# Patient Record
Sex: Male | Born: 1935 | Race: White | Hispanic: No | Marital: Married | Smoking: Former smoker
Health system: Southern US, Community
[De-identification: ages and names within clinical notes are randomized; demographics above are authoritative.]

## PROBLEM LIST (undated history)

## (undated) DIAGNOSIS — I214 Non-ST elevation (NSTEMI) myocardial infarction: Secondary | ICD-10-CM

## (undated) DIAGNOSIS — F0390 Unspecified dementia without behavioral disturbance: Secondary | ICD-10-CM

## (undated) DIAGNOSIS — C44310 Basal cell carcinoma of skin of unspecified parts of face: Secondary | ICD-10-CM

## (undated) DIAGNOSIS — R42 Dizziness and giddiness: Secondary | ICD-10-CM

## (undated) DIAGNOSIS — R627 Adult failure to thrive: Secondary | ICD-10-CM

## (undated) HISTORY — PX: HERNIA REPAIR: SHX51

## (undated) HISTORY — DX: Basal cell carcinoma of skin of unspecified parts of face: C44.310

---

## 1998-10-14 ENCOUNTER — Emergency Department (HOSPITAL_COMMUNITY): Admission: EM | Admit: 1998-10-14 | Discharge: 1998-10-14 | Payer: Self-pay

## 1998-10-15 ENCOUNTER — Ambulatory Visit (HOSPITAL_COMMUNITY): Admission: RE | Admit: 1998-10-15 | Discharge: 1998-10-15 | Payer: Self-pay

## 2005-12-29 ENCOUNTER — Inpatient Hospital Stay (HOSPITAL_COMMUNITY): Admission: EM | Admit: 2005-12-29 | Discharge: 2005-12-30 | Payer: Self-pay | Admitting: *Deleted

## 2012-04-14 DIAGNOSIS — Z23 Encounter for immunization: Secondary | ICD-10-CM | POA: Diagnosis not present

## 2012-10-11 DIAGNOSIS — R42 Dizziness and giddiness: Secondary | ICD-10-CM | POA: Diagnosis not present

## 2012-10-11 DIAGNOSIS — R404 Transient alteration of awareness: Secondary | ICD-10-CM | POA: Diagnosis not present

## 2013-02-11 ENCOUNTER — Emergency Department (HOSPITAL_COMMUNITY): Payer: Medicare Other

## 2013-02-11 ENCOUNTER — Emergency Department (HOSPITAL_COMMUNITY)
Admission: EM | Admit: 2013-02-11 | Discharge: 2013-02-11 | Disposition: A | Discharge status: Home / Self Care | Payer: Medicare Other | Attending: Emergency Medicine | Admitting: Emergency Medicine

## 2013-02-11 ENCOUNTER — Encounter (HOSPITAL_COMMUNITY): Payer: Self-pay | Admitting: *Deleted

## 2013-02-11 DIAGNOSIS — R5381 Other malaise: Secondary | ICD-10-CM | POA: Diagnosis not present

## 2013-02-11 DIAGNOSIS — Z79899 Other long term (current) drug therapy: Secondary | ICD-10-CM | POA: Insufficient documentation

## 2013-02-11 DIAGNOSIS — Z7982 Long term (current) use of aspirin: Secondary | ICD-10-CM | POA: Insufficient documentation

## 2013-02-11 DIAGNOSIS — R5383 Other fatigue: Secondary | ICD-10-CM

## 2013-02-11 DIAGNOSIS — R531 Weakness: Secondary | ICD-10-CM

## 2013-02-11 DIAGNOSIS — R42 Dizziness and giddiness: Secondary | ICD-10-CM

## 2013-02-11 HISTORY — DX: Dizziness and giddiness: R42

## 2013-02-11 LAB — COMPREHENSIVE METABOLIC PANEL
BUN: 14 mg/dL (ref 6–23)
Calcium: 9.2 mg/dL (ref 8.4–10.5)
Creatinine, Ser: 1.17 mg/dL (ref 0.50–1.35)
GFR calc Af Amer: 64 mL/min — ABNORMAL LOW (ref >90)
Glucose, Bld: 116 mg/dL — ABNORMAL HIGH (ref 70–99)
Total Protein: 6.9 g/dL (ref 6.0–8.3)

## 2013-02-11 LAB — CBC WITH DIFFERENTIAL/PLATELET
Eosinophils Absolute: 0 10*3/uL (ref 0.0–0.7)
Eosinophils Relative: 0 % (ref 0–5)
Hemoglobin: 13 g/dL (ref 13.0–17.0)
Lymphs Abs: 2.3 10*3/uL (ref 0.7–4.0)
MCH: 32 pg (ref 26.0–34.0)
MCV: 91.6 fL (ref 78.0–100.0)
Monocytes Relative: 8 % (ref 3–12)
RBC: 4.06 MIL/uL — ABNORMAL LOW (ref 4.22–5.81)

## 2013-02-11 LAB — URINALYSIS, ROUTINE W REFLEX MICROSCOPIC
Ketones, ur: 15 mg/dL — AB
Leukocytes, UA: NEGATIVE
Nitrite: NEGATIVE
Specific Gravity, Urine: 1.016 (ref 1.005–1.030)
pH: 6.5 (ref 5.0–8.0)

## 2013-02-11 MED ORDER — MECLIZINE HCL 25 MG PO TABS
25.0000 mg | ORAL_TABLET | Freq: Once | ORAL | Status: AC
  Administered 2013-02-11: 25 mg via ORAL
  Filled 2013-02-11: qty 1

## 2013-02-11 NOTE — ED Notes (Signed)
Pt and family reports pt having syncopal episode in April and has felt bad since then. Reports generalized fatigue and weakness, denies confusion. resp e/u. Skin w/d. Mae x4. A&ox4.

## 2013-02-11 NOTE — ED Provider Notes (Signed)
History     CSN: [id number]  Arrival date & time 02/11/13  1102   First MD Initiated Contact with Patient 02/11/13 1135      Chief Complaint  Patient presents with  . Fatigue    (Consider location/radiation/quality/duration/timing/severity/associated sxs/prior treatment) HPI Comments: Patient brought in today by his wife due to generalized weakness and fatigue.  His wife reports that the patient has been fatigued since April, however, she reports that his fatigue seemed worse over the past couple of days and he has been eating less.  Patient reports that he is feeling "fine."  He denies any complaints at this time aside from intermittent vertigo.  He states that he has had vertigo for several years.  No change in his symptoms at this time.  Vertigo worsens when he lies down too fast or sits up too fast.  He reports that he takes Meclizine for his symptoms, which does help.  He denies any dizziness at this time.Marland Kitchen  He denies chest pain, SOB, nausea, vomiting, diarrhea, urinary symptoms, cough, fever, focal weakness, numbness, or tingling.  He currently lives at home with his wife.  He does have a PCP.    The history is provided by the patient and the spouse.    Past Medical History  Diagnosis Date  . Vertigo     History reviewed. No pertinent past surgical history.  History reviewed. No pertinent family history.  History  Substance Use Topics  . Smoking status: Not on file  . Smokeless tobacco: Not on file  . Alcohol Use: No      Review of Systems  All other systems reviewed and are negative.    Allergies  Review of patient's allergies indicates no known allergies.  Home Medications   Current Outpatient Rx  Name  Route  Sig  Dispense  Refill  . acetaminophen (TYLENOL) 500 MG tablet   Oral   Take 1,000 mg by mouth every 6 (six) hours as needed for pain.         . Ascorbic Acid (VITAMIN C PO)   Oral   Take 1 tablet by mouth daily.         Marland Kitchen aspirin 81 MG  tablet   Oral   Take 81 mg by mouth daily.         . Cholecalciferol (VITAMIN D PO)   Oral   Take 1 tablet by mouth daily.         . Multiple Vitamins-Minerals (MULTIVITAMIN WITH MINERALS) tablet   Oral   Take 1 tablet by mouth daily.         . ranitidine (ZANTAC) 150 MG tablet   Oral   Take 150 mg by mouth daily.         Marland Kitchen VITAMIN E PO   Oral   Take 1 tablet by mouth daily.           BP 181/102  Pulse 102  Temp(Src) 98.3 F (36.8 C) (Oral)  Resp 22  SpO2 98%  Physical Exam  Nursing note and vitals reviewed. Constitutional: He is oriented to person, place, and time. He appears well-developed and well-nourished.  Non-toxic appearance. He does not have a sickly appearance. He does not appear ill. No distress.  HENT:  Head: Normocephalic and atraumatic.  Mouth/Throat: Oropharynx is clear and moist.  Eyes: EOM are normal. Pupils are equal, round, and reactive to light.  Neck: Normal range of motion. Neck supple.  Cardiovascular: Normal rate, regular rhythm and normal  heart sounds.   Pulmonary/Chest: Effort normal and breath sounds normal. No respiratory distress. He has no wheezes. He has no rales.  Abdominal: Soft. Bowel sounds are normal. He exhibits no distension and no mass. There is no tenderness. There is no rebound and no guarding.  Musculoskeletal: Normal range of motion.  Neurological: He is alert and oriented to person, place, and time. He has normal strength. No cranial nerve deficit or sensory deficit. Coordination and gait normal.  Skin: Skin is warm and dry. He is not diaphoretic.  Psychiatric: He has a normal mood and affect.    ED Course  Procedures (including critical care time)  Labs Reviewed  CBC WITH DIFFERENTIAL  COMPREHENSIVE METABOLIC PANEL  URINALYSIS, ROUTINE W REFLEX MICROSCOPIC   Dg Chest 2 View  02/11/2013   *RADIOLOGY REPORT*  Clinical Data: Weakness  CHEST - 2 VIEW  Comparison: Radiograph 12/29/2005  Findings: Normal cardiac  silhouette ectatic aorta.  No effusion, infiltrate, or pneumothorax. Degenerative osteophytosis of the thoracic spine. No acute cardiopulmonary process.  IMPRESSION: No acute cardiopulmonary process.   Original Report Authenticated By: Genevive Bi, M.D.     No diagnosis found.  Patient discussed with Dr. Rubin Payor.  MDM  Patient brought in today by his wife due to generalized weakness and fatigue.  Patient reports that he is feeling fine and denies any symptoms.  Patient is not ill appearing.  Patient with a normal neurological exam.  CXR negative.  UA negative.  Labs unremarkable.  Therefore, feel that the patient is stable for discharge.  Patient instructed to follow up with PCP.  Return precautions given.        Pascal Lux [place], PA-C 02/12/13 1502  Pascal Lux [place], PA-C 02/12/13 1502

## 2013-02-11 NOTE — ED Notes (Signed)
Patient transported to X-ray 

## 2013-02-12 NOTE — ED Provider Notes (Signed)
Medical screening examination/treatment/procedure(s) were performed by non-physician practitioner and as supervising physician I was immediately available for consultation/collaboration.  Lesbia Ottaway R. Almira Phetteplace, MD 02/12/13 2106 

## 2013-02-27 DIAGNOSIS — R911 Solitary pulmonary nodule: Secondary | ICD-10-CM | POA: Diagnosis not present

## 2013-02-27 DIAGNOSIS — K5730 Diverticulosis of large intestine without perforation or abscess without bleeding: Secondary | ICD-10-CM | POA: Diagnosis not present

## 2013-02-27 DIAGNOSIS — Z125 Encounter for screening for malignant neoplasm of prostate: Secondary | ICD-10-CM | POA: Diagnosis not present

## 2013-02-27 DIAGNOSIS — I10 Essential (primary) hypertension: Secondary | ICD-10-CM | POA: Diagnosis not present

## 2013-02-27 DIAGNOSIS — R42 Dizziness and giddiness: Secondary | ICD-10-CM | POA: Diagnosis not present

## 2013-02-27 DIAGNOSIS — R413 Other amnesia: Secondary | ICD-10-CM | POA: Diagnosis not present

## 2013-02-28 ENCOUNTER — Other Ambulatory Visit: Payer: Self-pay | Admitting: Internal Medicine

## 2013-02-28 DIAGNOSIS — R42 Dizziness and giddiness: Secondary | ICD-10-CM

## 2013-03-06 ENCOUNTER — Ambulatory Visit
Admission: RE | Admit: 2013-03-06 | Discharge: 2013-03-06 | Disposition: A | Discharge status: Home / Self Care | Payer: Medicare Other | Source: Ambulatory Visit | Attending: Internal Medicine | Admitting: Internal Medicine

## 2013-03-06 DIAGNOSIS — R42 Dizziness and giddiness: Secondary | ICD-10-CM | POA: Diagnosis not present

## 2013-03-06 DIAGNOSIS — I10 Essential (primary) hypertension: Secondary | ICD-10-CM | POA: Diagnosis not present

## 2013-03-06 DIAGNOSIS — R413 Other amnesia: Secondary | ICD-10-CM | POA: Diagnosis not present

## 2013-03-06 DIAGNOSIS — R630 Anorexia: Secondary | ICD-10-CM | POA: Diagnosis not present

## 2013-03-06 DIAGNOSIS — R7309 Other abnormal glucose: Secondary | ICD-10-CM | POA: Diagnosis not present

## 2013-03-06 MED ORDER — GADOBENATE DIMEGLUMINE 529 MG/ML IV SOLN
6.0000 mL | Freq: Once | INTRAVENOUS | Status: AC | PRN
  Administered 2013-03-06: 6 mL via INTRAVENOUS

## 2013-03-21 DIAGNOSIS — R413 Other amnesia: Secondary | ICD-10-CM | POA: Diagnosis not present

## 2013-03-21 DIAGNOSIS — I10 Essential (primary) hypertension: Secondary | ICD-10-CM | POA: Diagnosis not present

## 2013-06-07 DIAGNOSIS — Z23 Encounter for immunization: Secondary | ICD-10-CM | POA: Diagnosis not present

## 2014-06-13 DIAGNOSIS — Z23 Encounter for immunization: Secondary | ICD-10-CM | POA: Diagnosis not present

## 2014-09-30 DIAGNOSIS — I10 Essential (primary) hypertension: Secondary | ICD-10-CM | POA: Diagnosis not present

## 2015-03-24 DIAGNOSIS — I10 Essential (primary) hypertension: Secondary | ICD-10-CM | POA: Diagnosis not present

## 2015-04-10 DIAGNOSIS — D649 Anemia, unspecified: Secondary | ICD-10-CM | POA: Diagnosis not present

## 2015-04-10 DIAGNOSIS — Z125 Encounter for screening for malignant neoplasm of prostate: Secondary | ICD-10-CM | POA: Diagnosis not present

## 2015-04-10 DIAGNOSIS — R739 Hyperglycemia, unspecified: Secondary | ICD-10-CM | POA: Diagnosis not present

## 2015-04-10 DIAGNOSIS — I10 Essential (primary) hypertension: Secondary | ICD-10-CM | POA: Diagnosis not present

## 2015-07-06 DIAGNOSIS — Z23 Encounter for immunization: Secondary | ICD-10-CM | POA: Diagnosis not present

## 2015-08-05 DIAGNOSIS — I10 Essential (primary) hypertension: Secondary | ICD-10-CM | POA: Diagnosis not present

## 2015-08-05 DIAGNOSIS — Z79899 Other long term (current) drug therapy: Secondary | ICD-10-CM | POA: Diagnosis not present

## 2015-08-05 DIAGNOSIS — D649 Anemia, unspecified: Secondary | ICD-10-CM | POA: Diagnosis not present

## 2015-08-05 DIAGNOSIS — Z125 Encounter for screening for malignant neoplasm of prostate: Secondary | ICD-10-CM | POA: Diagnosis not present

## 2015-08-11 DIAGNOSIS — I10 Essential (primary) hypertension: Secondary | ICD-10-CM | POA: Diagnosis not present

## 2015-08-11 DIAGNOSIS — N183 Chronic kidney disease, stage 3 (moderate): Secondary | ICD-10-CM | POA: Diagnosis not present

## 2015-08-11 DIAGNOSIS — R413 Other amnesia: Secondary | ICD-10-CM | POA: Diagnosis not present

## 2015-08-11 DIAGNOSIS — D649 Anemia, unspecified: Secondary | ICD-10-CM | POA: Diagnosis not present

## 2015-12-01 DIAGNOSIS — R739 Hyperglycemia, unspecified: Secondary | ICD-10-CM | POA: Diagnosis not present

## 2015-12-01 DIAGNOSIS — I10 Essential (primary) hypertension: Secondary | ICD-10-CM | POA: Diagnosis not present

## 2015-12-08 DIAGNOSIS — I10 Essential (primary) hypertension: Secondary | ICD-10-CM | POA: Diagnosis not present

## 2015-12-08 DIAGNOSIS — R739 Hyperglycemia, unspecified: Secondary | ICD-10-CM | POA: Diagnosis not present

## 2015-12-08 DIAGNOSIS — D649 Anemia, unspecified: Secondary | ICD-10-CM | POA: Diagnosis not present

## 2016-01-22 DIAGNOSIS — I214 Non-ST elevation (NSTEMI) myocardial infarction: Secondary | ICD-10-CM

## 2016-01-22 HISTORY — DX: Non-ST elevation (NSTEMI) myocardial infarction: I21.4

## 2016-02-16 ENCOUNTER — Encounter (HOSPITAL_COMMUNITY): Payer: Self-pay | Admitting: Emergency Medicine

## 2016-02-16 DIAGNOSIS — E785 Hyperlipidemia, unspecified: Secondary | ICD-10-CM | POA: Diagnosis not present

## 2016-02-16 DIAGNOSIS — F0390 Unspecified dementia without behavioral disturbance: Secondary | ICD-10-CM | POA: Diagnosis present

## 2016-02-16 DIAGNOSIS — K219 Gastro-esophageal reflux disease without esophagitis: Secondary | ICD-10-CM | POA: Diagnosis present

## 2016-02-16 DIAGNOSIS — N183 Chronic kidney disease, stage 3 (moderate): Secondary | ICD-10-CM | POA: Diagnosis present

## 2016-02-16 DIAGNOSIS — Z8673 Personal history of transient ischemic attack (TIA), and cerebral infarction without residual deficits: Secondary | ICD-10-CM

## 2016-02-16 DIAGNOSIS — Z7982 Long term (current) use of aspirin: Secondary | ICD-10-CM

## 2016-02-16 DIAGNOSIS — I214 Non-ST elevation (NSTEMI) myocardial infarction: Principal | ICD-10-CM | POA: Diagnosis present

## 2016-02-16 DIAGNOSIS — R1013 Epigastric pain: Secondary | ICD-10-CM | POA: Diagnosis not present

## 2016-02-16 DIAGNOSIS — I129 Hypertensive chronic kidney disease with stage 1 through stage 4 chronic kidney disease, or unspecified chronic kidney disease: Secondary | ICD-10-CM | POA: Diagnosis present

## 2016-02-16 DIAGNOSIS — R079 Chest pain, unspecified: Secondary | ICD-10-CM | POA: Diagnosis not present

## 2016-02-16 DIAGNOSIS — R7989 Other specified abnormal findings of blood chemistry: Secondary | ICD-10-CM | POA: Diagnosis not present

## 2016-02-16 DIAGNOSIS — M545 Low back pain: Secondary | ICD-10-CM | POA: Diagnosis not present

## 2016-02-16 LAB — CBC WITH DIFFERENTIAL/PLATELET
Basophils Absolute: 0 10*3/uL (ref 0.0–0.1)
Basophils Relative: 0 %
EOS PCT: 2 %
Eosinophils Absolute: 0.2 10*3/uL (ref 0.0–0.7)
HCT: 35.1 % — ABNORMAL LOW (ref 39.0–52.0)
Hemoglobin: 11.6 g/dL — ABNORMAL LOW (ref 13.0–17.0)
LYMPHS ABS: 2.1 10*3/uL (ref 0.7–4.0)
LYMPHS PCT: 24 %
MCH: 30.5 pg (ref 26.0–34.0)
MCHC: 33 g/dL (ref 30.0–36.0)
MCV: 92.4 fL (ref 78.0–100.0)
MONO ABS: 0.7 10*3/uL (ref 0.1–1.0)
MONOS PCT: 8 %
Neutro Abs: 5.9 10*3/uL (ref 1.7–7.7)
Neutrophils Relative %: 66 %
PLATELETS: 189 10*3/uL (ref 150–400)
RBC: 3.8 MIL/uL — AB (ref 4.22–5.81)
RDW: 12.9 % (ref 11.5–15.5)
WBC: 8.9 10*3/uL (ref 4.0–10.5)

## 2016-02-16 LAB — BASIC METABOLIC PANEL
Anion gap: 7 (ref 5–15)
BUN: 20 mg/dL (ref 6–20)
CALCIUM: 9.5 mg/dL (ref 8.9–10.3)
CO2: 22 mmol/L (ref 22–32)
CREATININE: 1.77 mg/dL — AB (ref 0.61–1.24)
Chloride: 108 mmol/L (ref 101–111)
GFR calc Af Amer: 38 mL/min — ABNORMAL LOW (ref >60)
GFR, EST NON AFRICAN AMERICAN: 33 mL/min — AB (ref >60)
GLUCOSE: 145 mg/dL — AB (ref 65–99)
Potassium: 4.2 mmol/L (ref 3.5–5.1)
SODIUM: 137 mmol/L (ref 135–145)

## 2016-02-16 LAB — URINALYSIS, ROUTINE W REFLEX MICROSCOPIC
BILIRUBIN URINE: NEGATIVE
GLUCOSE, UA: NEGATIVE mg/dL
HGB URINE DIPSTICK: NEGATIVE
Ketones, ur: NEGATIVE mg/dL
Leukocytes, UA: NEGATIVE
Nitrite: NEGATIVE
PROTEIN: NEGATIVE mg/dL
Specific Gravity, Urine: 1.025 (ref 1.005–1.030)
pH: 5 (ref 5.0–8.0)

## 2016-02-16 NOTE — ED Notes (Signed)
Pt. reports mid  back and left arm pain onset today , denies injury or fall .

## 2016-02-17 ENCOUNTER — Other Ambulatory Visit: Payer: Self-pay

## 2016-02-17 ENCOUNTER — Encounter (HOSPITAL_COMMUNITY): Payer: Self-pay | Admitting: General Practice

## 2016-02-17 ENCOUNTER — Encounter (HOSPITAL_COMMUNITY)
Admission: EM | Disposition: A | Discharge status: Home / Self Care | Payer: Self-pay | Source: Home / Self Care | Attending: Cardiology

## 2016-02-17 ENCOUNTER — Emergency Department (HOSPITAL_COMMUNITY): Payer: Medicare Other

## 2016-02-17 ENCOUNTER — Inpatient Hospital Stay (HOSPITAL_COMMUNITY)
Admission: EM | Admit: 2016-02-17 | Discharge: 2016-02-18 | DRG: 247 | Disposition: A | Discharge status: Home / Self Care | Payer: Medicare Other | Attending: Cardiology | Admitting: Cardiology

## 2016-02-17 DIAGNOSIS — I214 Non-ST elevation (NSTEMI) myocardial infarction: Secondary | ICD-10-CM | POA: Diagnosis present

## 2016-02-17 DIAGNOSIS — F0390 Unspecified dementia without behavioral disturbance: Secondary | ICD-10-CM

## 2016-02-17 DIAGNOSIS — R079 Chest pain, unspecified: Secondary | ICD-10-CM | POA: Diagnosis not present

## 2016-02-17 DIAGNOSIS — N183 Chronic kidney disease, stage 3 (moderate): Secondary | ICD-10-CM | POA: Diagnosis not present

## 2016-02-17 DIAGNOSIS — Z8673 Personal history of transient ischemic attack (TIA), and cerebral infarction without residual deficits: Secondary | ICD-10-CM | POA: Diagnosis not present

## 2016-02-17 DIAGNOSIS — I2510 Atherosclerotic heart disease of native coronary artery without angina pectoris: Secondary | ICD-10-CM | POA: Diagnosis not present

## 2016-02-17 DIAGNOSIS — I129 Hypertensive chronic kidney disease with stage 1 through stage 4 chronic kidney disease, or unspecified chronic kidney disease: Secondary | ICD-10-CM | POA: Diagnosis not present

## 2016-02-17 DIAGNOSIS — K219 Gastro-esophageal reflux disease without esophagitis: Secondary | ICD-10-CM | POA: Diagnosis not present

## 2016-02-17 DIAGNOSIS — E785 Hyperlipidemia, unspecified: Secondary | ICD-10-CM | POA: Diagnosis not present

## 2016-02-17 DIAGNOSIS — R1013 Epigastric pain: Secondary | ICD-10-CM | POA: Diagnosis not present

## 2016-02-17 DIAGNOSIS — Z955 Presence of coronary angioplasty implant and graft: Secondary | ICD-10-CM

## 2016-02-17 DIAGNOSIS — Z7982 Long term (current) use of aspirin: Secondary | ICD-10-CM | POA: Diagnosis not present

## 2016-02-17 HISTORY — DX: Non-ST elevation (NSTEMI) myocardial infarction: I21.4

## 2016-02-17 HISTORY — PX: CARDIAC CATHETERIZATION: SHX172

## 2016-02-17 LAB — BASIC METABOLIC PANEL
ANION GAP: 9 (ref 5–15)
BUN: 18 mg/dL (ref 6–20)
CHLORIDE: 109 mmol/L (ref 101–111)
CO2: 24 mmol/L (ref 22–32)
Calcium: 9.4 mg/dL (ref 8.9–10.3)
Creatinine, Ser: 1.74 mg/dL — ABNORMAL HIGH (ref 0.61–1.24)
GFR calc Af Amer: 39 mL/min — ABNORMAL LOW (ref >60)
GFR calc non Af Amer: 34 mL/min — ABNORMAL LOW (ref >60)
GLUCOSE: 103 mg/dL — AB (ref 65–99)
POTASSIUM: 4.2 mmol/L (ref 3.5–5.1)
Sodium: 142 mmol/L (ref 135–145)

## 2016-02-17 LAB — PROTIME-INR
INR: 1.36 (ref 0.00–1.49)
PROTHROMBIN TIME: 16.9 s — AB (ref 11.6–15.2)

## 2016-02-17 LAB — TROPONIN I
TROPONIN I: 13.41 ng/mL — AB (ref <0.03)
TROPONIN I: 39.82 ng/mL — AB (ref <0.03)
TROPONIN I: 41.27 ng/mL — AB (ref <0.03)
Troponin I: 38.97 ng/mL (ref <0.03)
Troponin I: 51.91 ng/mL (ref <0.03)

## 2016-02-17 LAB — LIPID PANEL
CHOL/HDL RATIO: 2.4 ratio
Cholesterol: 120 mg/dL (ref 0–200)
HDL: 51 mg/dL (ref >40)
LDL CALC: 59 mg/dL (ref 0–99)
Triglycerides: 50 mg/dL (ref <150)
VLDL: 10 mg/dL (ref 0–40)

## 2016-02-17 LAB — POCT ACTIVATED CLOTTING TIME: ACTIVATED CLOTTING TIME: 417 s

## 2016-02-17 LAB — I-STAT TROPONIN, ED: TROPONIN I, POC: 11.62 ng/mL — AB (ref 0.00–0.08)

## 2016-02-17 LAB — HEPARIN LEVEL (UNFRACTIONATED): Heparin Unfractionated: 0.7 IU/mL (ref 0.30–0.70)

## 2016-02-17 SURGERY — LEFT HEART CATH AND CORONARY ANGIOGRAPHY

## 2016-02-17 MED ORDER — SODIUM CHLORIDE 0.9% FLUSH
3.0000 mL | Freq: Two times a day (BID) | INTRAVENOUS | Status: DC
  Administered 2016-02-17: 17:00:00 3 mL via INTRAVENOUS

## 2016-02-17 MED ORDER — ASPIRIN 300 MG RE SUPP: 300.0000 mg | RECTAL | Status: AC

## 2016-02-17 MED ORDER — SODIUM CHLORIDE 0.9% FLUSH: 3.0000 mL | Freq: Two times a day (BID) | INTRAVENOUS | Status: DC

## 2016-02-17 MED ORDER — FENTANYL CITRATE (PF) 100 MCG/2ML IJ SOLN
INTRAMUSCULAR | Status: AC
  Filled 2016-02-17: qty 2

## 2016-02-17 MED ORDER — ATORVASTATIN CALCIUM 80 MG PO TABS
80.0000 mg | ORAL_TABLET | Freq: Every day | ORAL | Status: DC
  Administered 2016-02-17: 80 mg via ORAL
  Filled 2016-02-17: qty 1

## 2016-02-17 MED ORDER — SODIUM CHLORIDE 0.9 % IV SOLN
1.0000 mg/kg/h | INTRAVENOUS | Status: AC
  Filled 2016-02-17: qty 250

## 2016-02-17 MED ORDER — IOPAMIDOL (ISOVUE-370) INJECTION 76%
INTRAVENOUS | Status: AC
  Filled 2016-02-17: qty 100

## 2016-02-17 MED ORDER — VERAPAMIL HCL 2.5 MG/ML IV SOLN
INTRAVENOUS | Status: AC
  Filled 2016-02-17: qty 2

## 2016-02-17 MED ORDER — HEPARIN (PORCINE) IN NACL 2-0.9 UNIT/ML-% IJ SOLN
INTRAMUSCULAR | Status: AC
  Filled 2016-02-17: qty 1000

## 2016-02-17 MED ORDER — FENTANYL CITRATE (PF) 100 MCG/2ML IJ SOLN
12.5000 ug | INTRAMUSCULAR | Status: AC | PRN
  Administered 2016-02-17 (×2): 12.5 ug via INTRAVENOUS
  Filled 2016-02-17: qty 2

## 2016-02-17 MED ORDER — SODIUM CHLORIDE 0.9% FLUSH: 3.0000 mL | INTRAVENOUS | Status: DC | PRN

## 2016-02-17 MED ORDER — ONDANSETRON HCL 4 MG/2ML IJ SOLN: 4.0000 mg | Freq: Four times a day (QID) | INTRAMUSCULAR | Status: DC | PRN

## 2016-02-17 MED ORDER — NITROGLYCERIN 0.4 MG SL SUBL
0.4000 mg | SUBLINGUAL_TABLET | SUBLINGUAL | Status: DC | PRN
  Administered 2016-02-17 (×2): 0.4 mg via SUBLINGUAL
  Filled 2016-02-17: qty 1

## 2016-02-17 MED ORDER — LIDOCAINE HCL (PF) 1 % IJ SOLN
INTRAMUSCULAR | Status: DC | PRN
  Administered 2016-02-17: 2 mL via SUBCUTANEOUS

## 2016-02-17 MED ORDER — SODIUM CHLORIDE 0.9 % WEIGHT BASED INFUSION: 3.0000 mL/kg/h | INTRAVENOUS | Status: DC

## 2016-02-17 MED ORDER — TICAGRELOR 90 MG PO TABS
ORAL_TABLET | ORAL | Status: DC | PRN
Start: 2016-02-17 — End: 2016-02-17
  Administered 2016-02-17: 180 mg via ORAL

## 2016-02-17 MED ORDER — LIDOCAINE HCL (PF) 1 % IJ SOLN
INTRAMUSCULAR | Status: AC
Start: 2016-02-17 — End: 2016-02-17
  Filled 2016-02-17: qty 30

## 2016-02-17 MED ORDER — ACETAMINOPHEN 325 MG PO TABS
650.0000 mg | ORAL_TABLET | ORAL | Status: DC | PRN
  Filled 2016-02-17: qty 2

## 2016-02-17 MED ORDER — NITROGLYCERIN IN D5W 200-5 MCG/ML-% IV SOLN
5.0000 ug/min | INTRAVENOUS | Status: DC
  Administered 2016-02-17: 5 ug/min via INTRAVENOUS
  Filled 2016-02-17 (×2): qty 250

## 2016-02-17 MED ORDER — TICAGRELOR 90 MG PO TABS
ORAL_TABLET | ORAL | Status: AC
  Filled 2016-02-17: qty 1

## 2016-02-17 MED ORDER — SODIUM CHLORIDE 0.9 % IV SOLN: 250.0000 mL | INTRAVENOUS | Status: DC | PRN

## 2016-02-17 MED ORDER — ASPIRIN EC 81 MG PO TBEC
81.0000 mg | DELAYED_RELEASE_TABLET | Freq: Every day | ORAL | Status: DC
  Administered 2016-02-18: 81 mg via ORAL
  Filled 2016-02-17: qty 1

## 2016-02-17 MED ORDER — IOPAMIDOL (ISOVUE-370) INJECTION 76%
INTRAVENOUS | Status: DC | PRN
  Administered 2016-02-17: 65 mL

## 2016-02-17 MED ORDER — HEPARIN (PORCINE) IN NACL 100-0.45 UNIT/ML-% IJ SOLN
700.0000 [IU]/h | INTRAMUSCULAR | Status: DC
  Administered 2016-02-17: 700 [IU]/h via INTRAVENOUS
  Filled 2016-02-17: qty 250

## 2016-02-17 MED ORDER — SODIUM CHLORIDE 0.9 % IV SOLN
250.0000 mg | INTRAVENOUS | Status: DC | PRN
  Administered 2016-02-17: 0.25 mg/kg/h via INTRAVENOUS

## 2016-02-17 MED ORDER — BIVALIRUDIN BOLUS VIA INFUSION - CUPID
INTRAVENOUS | Status: DC | PRN
  Administered 2016-02-17: 41.325 mg via INTRAVENOUS

## 2016-02-17 MED ORDER — ASPIRIN 81 MG PO CHEW
324.0000 mg | CHEWABLE_TABLET | Freq: Once | ORAL | Status: AC
  Administered 2016-02-17: 324 mg via ORAL
  Filled 2016-02-17: qty 4

## 2016-02-17 MED ORDER — HEPARIN BOLUS VIA INFUSION
3000.0000 [IU] | Freq: Once | INTRAVENOUS | Status: AC
  Administered 2016-02-17: 3000 [IU] via INTRAVENOUS
  Filled 2016-02-17: qty 3000

## 2016-02-17 MED ORDER — HEPARIN (PORCINE) IN NACL 2-0.9 UNIT/ML-% IJ SOLN
INTRAMUSCULAR | Status: DC | PRN
  Administered 2016-02-17: 1000 mL via INTRA_ARTERIAL

## 2016-02-17 MED ORDER — ASPIRIN 81 MG PO CHEW
324.0000 mg | CHEWABLE_TABLET | ORAL | Status: AC
  Filled 2016-02-17: qty 4

## 2016-02-17 MED ORDER — BIVALIRUDIN 250 MG IV SOLR
INTRAVENOUS | Status: AC
  Filled 2016-02-17: qty 250

## 2016-02-17 MED ORDER — TICAGRELOR 90 MG PO TABS
90.0000 mg | ORAL_TABLET | Freq: Two times a day (BID) | ORAL | Status: DC
  Administered 2016-02-17 – 2016-02-18 (×2): 90 mg via ORAL
  Filled 2016-02-17 (×2): qty 1

## 2016-02-17 MED ORDER — SODIUM CHLORIDE 0.9 % WEIGHT BASED INFUSION
1.0000 mL/kg/h | INTRAVENOUS | Status: DC
  Administered 2016-02-17: 1 mL/kg/h via INTRAVENOUS

## 2016-02-17 MED ORDER — HEPARIN SODIUM (PORCINE) 1000 UNIT/ML IJ SOLN
INTRAMUSCULAR | Status: DC | PRN
  Administered 2016-02-17: 3000 [IU] via INTRAVENOUS

## 2016-02-17 MED ORDER — ASPIRIN 81 MG PO CHEW
81.0000 mg | CHEWABLE_TABLET | ORAL | Status: AC
  Administered 2016-02-17: 81 mg via ORAL

## 2016-02-17 MED ORDER — NITROGLYCERIN 0.4 MG SL SUBL: 0.4000 mg | SUBLINGUAL_TABLET | SUBLINGUAL | Status: DC | PRN

## 2016-02-17 MED ORDER — FAMOTIDINE 20 MG PO TABS
20.0000 mg | ORAL_TABLET | Freq: Every day | ORAL | Status: DC
  Administered 2016-02-17 – 2016-02-18 (×2): 20 mg via ORAL
  Filled 2016-02-17 (×2): qty 1

## 2016-02-17 MED ORDER — SODIUM CHLORIDE 0.9 % IV SOLN: Freq: Once | INTRAVENOUS | Status: DC

## 2016-02-17 MED ORDER — HEPARIN SODIUM (PORCINE) 1000 UNIT/ML IJ SOLN
INTRAMUSCULAR | Status: AC
  Filled 2016-02-17: qty 1

## 2016-02-17 MED ORDER — ACETAMINOPHEN 325 MG PO TABS
650.0000 mg | ORAL_TABLET | ORAL | Status: DC | PRN
  Administered 2016-02-18: 01:00:00 650 mg via ORAL

## 2016-02-17 MED ORDER — DONEPEZIL HCL 5 MG PO TABS
5.0000 mg | ORAL_TABLET | Freq: Every day | ORAL | Status: DC
  Administered 2016-02-17: 5 mg via ORAL
  Filled 2016-02-17: qty 1

## 2016-02-17 MED ORDER — FENTANYL CITRATE (PF) 100 MCG/2ML IJ SOLN
INTRAMUSCULAR | Status: DC | PRN
  Administered 2016-02-17: 12.5 ug via INTRAVENOUS

## 2016-02-17 SURGICAL SUPPLY — 21 items
BALLN EUPHORA RX 2.5X15 (BALLOONS) ×3
BALLN ~~LOC~~ EUPHORA RX 3.5X15 (BALLOONS) ×3
BALLOON EUPHORA RX 2.5X15 (BALLOONS) IMPLANT
BALLOON ~~LOC~~ EUPHORA RX 3.5X15 (BALLOONS) IMPLANT
CATH HEARTRAIL 6F IR1.5 (CATHETERS) ×2 IMPLANT
CATH INFINITI 5 FR 3DRC (CATHETERS) ×2 IMPLANT
CATH INFINITI 5 FR JL3.5 (CATHETERS) ×2 IMPLANT
CATH INFINITI JR4 5F (CATHETERS) ×2 IMPLANT
CATH VISTA GUIDE 6FR H STICK (CATHETERS) ×2 IMPLANT
DEVICE RAD COMP TR BAND LRG (VASCULAR PRODUCTS) ×4 IMPLANT
GLIDESHEATH SLEND SS 6F .021 (SHEATH) ×2 IMPLANT
KIT ENCORE 26 ADVANTAGE (KITS) ×2 IMPLANT
KIT HEART LEFT (KITS) ×3 IMPLANT
PACK CARDIAC CATHETERIZATION (CUSTOM PROCEDURE TRAY) ×3 IMPLANT
STENT SYNERGY DES 3X32 (Permanent Stent) ×2 IMPLANT
TRANSDUCER W/STOPCOCK (MISCELLANEOUS) ×3 IMPLANT
TUBING CIL FLEX 10 FLL-RA (TUBING) ×3 IMPLANT
VALVE GUARDIAN II ~~LOC~~ HEMO (MISCELLANEOUS) ×2 IMPLANT
WIRE ASAHI PROWATER 180CM (WIRE) ×2 IMPLANT
WIRE HI TORQ VERSACORE-J 145CM (WIRE) ×2 IMPLANT
WIRE SAFE-T 1.5MM-J .035X260CM (WIRE) ×2 IMPLANT

## 2016-02-17 NOTE — Progress Notes (Signed)
TR BAND REMOVAL  LOCATION:  right radial  DEFLATED PER PROTOCOL:  Yes.    TIME BAND OFF / DRESSING APPLIED:   1830   SITE UPON ARRIVAL:   Level 2  SITE AFTER BAND REMOVAL:  Level 2  CIRCULATION SENSATION AND MOVEMENT:  Within Normal Limits  Yes.    COMMENTS:  Deflated conservatively secondary to hematoma post cath.  Forearm with large bruise, slightly puffy.

## 2016-02-17 NOTE — Interval H&P Note (Signed)
Cath Lab Visit (complete for each Cath Lab visit)  Clinical Evaluation Leading to the Procedure:   ACS: Yes.    Non-ACS:    Anginal Classification: CCS IV  Anti-ischemic medical therapy: Minimal Therapy (1 class of medications)  Non-Invasive Test Results: No non-invasive testing performed  Prior CABG: No previous CABG      History and Physical Interval Note:  02/17/2016 11:29 AM  Daniel Schmitt  has presented today for surgery, with the diagnosis of cp  The various methods of treatment have been discussed with the patient and family. After consideration of risks, benefits and other options for treatment, the patient has consented to  Procedure(s): Left Heart Cath and Coronary Angiography (N/A) as a surgical intervention .  The patient's history has been reviewed, patient examined, no change in status, stable for surgery.  I have reviewed the patient's chart and labs.  Questions were answered to the patient's satisfaction.     Larae Grooms

## 2016-02-17 NOTE — Progress Notes (Signed)
Wife in to visit.

## 2016-02-17 NOTE — Progress Notes (Signed)
BP cuff reading 72mm. Released to 4mm

## 2016-02-17 NOTE — Progress Notes (Signed)
Manual BP cuff off. Hematoma distal to band w/ dark bruising<3cm. Spoke w/Dr. Irish Lack on phone w/update.

## 2016-02-17 NOTE — Progress Notes (Signed)
ANTICOAGULATION CONSULT NOTE - Initial Consult  Pharmacy Consult for Heparin  Indication: chest pain/ACS  No Known Allergies  Patient Measurements: Height: 5\' 3"  (160 cm) Weight: 125 lb (56.7 kg) IBW/kg (Calculated) : 56.9  Vital Signs: Temp: 97.5 F (36.4 C) (06/26 2003) Temp Source: Oral (06/26 2003) BP: 121/65 mmHg (06/27 0145) Pulse Rate: 56 (06/27 0145)  Labs:  Recent Labs  02/16/16 2027  HGB 11.6*  HCT 35.1*  PLT 189  CREATININE 1.77*    Estimated Creatinine Clearance: 23.6 mL/min (by C-G formula based on Cr of 1.77).   Medical History: Past Medical History  Diagnosis Date  . Vertigo     Assessment: 82 y/o M with back/arm pain, elevated troponin, starting heparin, Hgb 11.6, noted renal dysfunction, PTA meds reviewed.   Goal of Therapy:  Heparin level 0.3-0.7 units/ml Monitor platelets by anticoagulation protocol: Yes   Plan:  -Heparin 3000 units BOLUS -Start heparin drip at 700 units/hr -1100 HL -Daily CBC/HL -Monitor for bleeding  Narda Bonds 02/17/2016,2:34 AM

## 2016-02-17 NOTE — Progress Notes (Signed)
Patient c/o SOB and fingers rt hand going numb. Dr. Irish Lack in to see patient. Decreased IVF to cc/kg as ordered. Wife in. Drinking coke, eating PB crackers. O2 2L [place].

## 2016-02-17 NOTE — Progress Notes (Signed)
Utilization review completed.  

## 2016-02-17 NOTE — Progress Notes (Signed)
Patient ID: Daniel Schmitt, male   DOB: October 25, 1925, 82 y.o.   MRN: [id number] Admitted by Dr Aundra Dubin this am No chest pain Seems a bit confused Discussed cath willing to proceed  [name]

## 2016-02-17 NOTE — Progress Notes (Signed)
Arrived to [facility] w/ manual BP cuff on rt forearm inflated at 37mmHg

## 2016-02-17 NOTE — ED Provider Notes (Signed)
CSN: [id number]     Arrival date & time 02/16/16  1927 History   First MD Initiated Contact with Patient 02/17/16 0131     Chief Complaint  Patient presents with  . Back Pain  . Arm Pain     (Consider location/radiation/quality/duration/timing/severity/associated sxs/prior Treatment) HPI Comments: 82 yo patient with no significant medical history presents with complaint of upper left back pain extending into axilla that started around 4:00 yesterday afternoon. No fall or injury. No SOB, cough or fever. Here the patient complains of epigastric abdominal pain. No N, V. The patient's wife reports patient has dementia and does not remember complaining of back pain earlier but she states he went upstairs stating he didn't feel well and that is when the back pain started. No headache, neck pain, change in bowel movements or urinary symptoms.   Patient is a 82 y.o. male presenting with back pain and arm pain. The history is provided by the patient, the spouse and a relative. No language interpreter was used.  Back Pain Associated symptoms: abdominal pain   Associated symptoms: no chest pain, no dysuria and no fever   Arm Pain Associated symptoms include abdominal pain and fatigue. Pertinent negatives include no chest pain, chills, coughing, fever or nausea.    Past Medical History  Diagnosis Date  . Vertigo    History reviewed. No pertinent past surgical history. No family history on file. Social History  Substance Use Topics  . Smoking status: Never Smoker   . Smokeless tobacco: None  . Alcohol Use: No    Review of Systems  Constitutional: Positive for fatigue. Negative for fever and chills.  Respiratory: Negative.  Negative for cough and shortness of breath.   Cardiovascular: Negative.  Negative for chest pain.  Gastrointestinal: Positive for abdominal pain. Negative for nausea.  Genitourinary: Negative.  Negative for dysuria and frequency.  Musculoskeletal: Positive for back pain.   Skin: Negative.   Neurological: Negative.       Allergies  Review of patient's allergies indicates no known allergies.  Home Medications   Prior to Admission medications   Medication Sig Start Date End Date Taking? Authorizing Provider  acetaminophen (TYLENOL) 500 MG tablet Take 1,000 mg by mouth every 6 (six) hours as needed for pain.    Historical Provider, MD  Ascorbic Acid (VITAMIN C PO) Take 1 tablet by mouth daily.    Historical Provider, MD  aspirin 81 MG tablet Take 81 mg by mouth daily.    Historical Provider, MD  Cholecalciferol (VITAMIN D PO) Take 1 tablet by mouth daily.    Historical Provider, MD  Multiple Vitamins-Minerals (MULTIVITAMIN WITH MINERALS) tablet Take 1 tablet by mouth daily.    Historical Provider, MD  ranitidine (ZANTAC) 150 MG tablet Take 150 mg by mouth daily.    Historical Provider, MD  VITAMIN E PO Take 1 tablet by mouth daily.    Historical Provider, MD   BP 136/70 mmHg  Pulse 64  Temp(Src) 97.5 F (36.4 C) (Oral)  Resp 16  Ht 5\' 3"  (1.6 m)  Wt 56.7 kg  BMI 22.15 kg/m2  SpO2 93% Physical Exam  Constitutional: He is oriented to person, place, and time. He appears well-developed and well-nourished.  HENT:  Head: Normocephalic.  Neck: Normal range of motion. Neck supple.  Cardiovascular: Normal rate and regular rhythm.   Pulmonary/Chest: Effort normal and breath sounds normal. He has no wheezes. He has no rales. He exhibits no tenderness.  Abdominal: Soft. Bowel sounds are normal.  There is no tenderness (Specifically, no tenderness of epigastric area.). There is no rebound and no guarding.  Musculoskeletal: Normal range of motion.  No midline or generalized upper back tenderness. There is no trauma to back or redness/wound/rash.  Neurological: He is alert and oriented to person, place, and time.  Skin: Skin is warm and dry. No rash noted.  Psychiatric: He has a normal mood and affect.    ED Course  Procedures (including critical care  time) Labs Review Labs Reviewed  CBC WITH DIFFERENTIAL/PLATELET - Abnormal; Notable for the following:    RBC 3.80 (*)    Hemoglobin 11.6 (*)    HCT 35.1 (*)    All other components within normal limits  BASIC METABOLIC PANEL - Abnormal; Notable for the following:    Glucose, Bld 145 (*)    Creatinine, Ser 1.77 (*)    GFR calc non Af Amer 33 (*)    GFR calc Af Amer 38 (*)    All other components within normal limits  URINALYSIS, ROUTINE W REFLEX MICROSCOPIC (NOT AT [facility])  I-STAT TROPOININ, ED   Results for orders placed or performed during the hospital encounter of 02/17/16  CBC with Differential  Result Value Ref Range   WBC 8.9 4.0 - 10.5 K/uL   RBC 3.80 (L) 4.22 - 5.81 MIL/uL   Hemoglobin 11.6 (L) 13.0 - 17.0 g/dL   HCT 35.1 (L) 39.0 - 52.0 %   MCV 92.4 78.0 - 100.0 fL   MCH 30.5 26.0 - 34.0 pg   MCHC 33.0 30.0 - 36.0 g/dL   RDW 12.9 11.5 - 15.5 %   Platelets 189 150 - 400 K/uL   Neutrophils Relative % 66 %   Neutro Abs 5.9 1.7 - 7.7 K/uL   Lymphocytes Relative 24 %   Lymphs Abs 2.1 0.7 - 4.0 K/uL   Monocytes Relative 8 %   Monocytes Absolute 0.7 0.1 - 1.0 K/uL   Eosinophils Relative 2 %   Eosinophils Absolute 0.2 0.0 - 0.7 K/uL   Basophils Relative 0 %   Basophils Absolute 0.0 0.0 - 0.1 K/uL  Basic metabolic panel  Result Value Ref Range   Sodium 137 135 - 145 mmol/L   Potassium 4.2 3.5 - 5.1 mmol/L   Chloride 108 101 - 111 mmol/L   CO2 22 22 - 32 mmol/L   Glucose, Bld 145 (H) 65 - 99 mg/dL   BUN 20 6 - 20 mg/dL   Creatinine, Ser 1.77 (H) 0.61 - 1.24 mg/dL   Calcium 9.5 8.9 - 10.3 mg/dL   GFR calc non Af Amer 33 (L) >60 mL/min   GFR calc Af Amer 38 (L) >60 mL/min   Anion gap 7 5 - 15  Urinalysis, Routine w reflex microscopic (not at [facility])  Result Value Ref Range   Color, Urine YELLOW YELLOW   APPearance CLEAR CLEAR   Specific Gravity, Urine 1.025 1.005 - 1.030   pH 5.0 5.0 - 8.0   Glucose, UA NEGATIVE NEGATIVE mg/dL   Hgb urine dipstick NEGATIVE NEGATIVE    Bilirubin Urine NEGATIVE NEGATIVE   Ketones, ur NEGATIVE NEGATIVE mg/dL   Protein, ur NEGATIVE NEGATIVE mg/dL   Nitrite NEGATIVE NEGATIVE   Leukocytes, UA NEGATIVE NEGATIVE  I-stat troponin, ED  Result Value Ref Range   Troponin i, poc 11.62 (HH) 0.00 - 0.08 ng/mL   Comment NOTIFIED PHYSICIAN    Comment 3            Imaging Review No results  found. I have personally reviewed and evaluated these images and lab results as part of my medical decision-making.   EKG Interpretation None      MDM   Final diagnoses:  None    1. Back pain 2. Elevated troponin  1:30 - Patient presents to ED with "not feeling well" around 4:00 pm yesterday with onset of upper left back pain extending into axilla. Back pain resolved while waiting to be seen in the emergency department and he now complains of epigastric pain. No N, V, SOB, increased pain with breathing. No significant medical history. Troponin ordered, CXR, EKG.   2:20 - Patient found to have a substantially elevated troponin. EKG interpreted by Dr. Leonides Schanz and is unchanged from previous. Re-evaluation of the patient - he continues to have epigastric discomfort. Per wife and daughter, he has complained of back pain to them after arrival here. Discussed the patient with Dr. Aundra Dubin who will see the patient in the emergency department and admit.     Charlann Lange, PA-C 02/17/16 [place], DO 02/17/16 [id number]

## 2016-02-17 NOTE — H&P (Signed)
Physician History and Physical    ESPERANZA GOOSMAN MRN: [id number] DOB/AGE: May 08, 1926 82 y.o. Admit date: 02/17/2016  Primary Care Physician: Dr Maudie Mercury Primary Cardiologist: New  HPI: 82 yo with history of dementia was admitted with upper back and epigastric pain, found to have NSTEMI.  No prior cardiac history.  History comes from both patient and wife.  He was sitting on porch around 4 pm when he developed pain across his upper back and down his left arm.  He then developed an "indigestion"-like sensation in his epigastric area.  He went upstairs to bed and told his wife what was going on.  When the symptoms did not resolve, wife and daughter took him to the ER.  By the time they got to the ER, the back and arm pain had resolved, still had mild epigastric discomfort.  ECG showed poor anterior R wave progression.  Troponin was elevated at 11.6.  Currently, he reports that he's feeling ok.  Still with mild epigastric discomfort, no chest pain and no back/arm pain.  No dyspnea.    Review of systems complete and found to be negative unless listed above   PMH: 1. Dementia 2. ?CKD 3. GERD 4. TIA  Family History: No premature CAD.  Social History   Social History  . Marital Status: Married    Spouse Name: N/A  . Number of Children: N/A  . Years of Education: N/A   Occupational History  . Not on file.   Social History Main Topics  . Smoking status: Never Smoker   . Smokeless tobacco: Not on file  . Alcohol Use: No  . Drug Use: No  . Sexual Activity: Not on file   Other Topics Concern  . Not on file   Social History Narrative    Current Facility-Administered Medications  Medication Dose Route Frequency Provider Last Rate Last Dose  . aspirin chewable tablet 324 mg  324 mg Oral Once [employer], DO      . heparin ADULT infusion 100 units/mL (25000 units/275mL sodium chloride 0.45%)  700 Units/hr Intravenous Continuous Erenest Blank, RPH      . heparin bolus via  infusion 3,000 Units  3,000 Units Intravenous Once Erenest Blank, RPH      . nitroGLYCERIN (NITROSTAT) SL tablet 0.4 mg  0.4 mg Sublingual Q5 min PRN Delice Bison Ward, DO       Current Outpatient Prescriptions  Medication Sig Dispense Refill  . acetaminophen (TYLENOL) 500 MG tablet Take 1,000 mg by mouth every 6 (six) hours as needed for pain.    . Ascorbic Acid (VITAMIN C PO) Take 1 tablet by mouth daily.    Marland Kitchen aspirin 81 MG tablet Take 81 mg by mouth daily.    . Cholecalciferol (VITAMIN D PO) Take 1 tablet by mouth daily.    . Multiple Vitamins-Minerals (MULTIVITAMIN WITH MINERALS) tablet Take 1 tablet by mouth daily.    . ranitidine (ZANTAC) 150 MG tablet Take 150 mg by mouth daily.    Marland Kitchen VITAMIN E PO Take 1 tablet by mouth daily.      Physical Exam: Blood pressure 121/65, pulse 56, temperature 97.5 F (36.4 C), temperature source Oral, resp. rate 18, height 5\' 3"  (1.6 m), weight 125 lb (56.7 kg), SpO2 99 %.  General: NAD Neck: No JVD, no thyromegaly or thyroid nodule.  Lungs: Clear to auscultation bilaterally with normal respiratory effort. CV: Nondisplaced PMI.  Heart regular S1/S2, no S3/S4, no murmur.  No  peripheral edema.  No carotid bruit.  Normal pedal pulses.  Abdomen: Soft, nontender, no hepatosplenomegaly, no distention.  Skin: Intact without lesions or rashes.  Neurologic: Alert and oriented x 3.  Psych: Normal affect. Extremities: No clubbing or cyanosis.  HEENT: Normal.   Labs:   Lab Results  Component Value Date   WBC 8.9 02/16/2016   HGB 11.6* 02/16/2016   HCT 35.1* 02/16/2016   MCV 92.4 02/16/2016   PLT 189 02/16/2016    Recent Labs Lab 02/16/16 2027  NA 137  K 4.2  CL 108  CO2 22  BUN 20  CREATININE 1.77*  CALCIUM 9.5  GLUCOSE 145*  TnI 11.6   Radiology: - CXR: Clear  EKG: NSR, poor anterior RWP  ASSESSMENT AND PLAN: 82 yo with history of dementia was admitted with upper back and epigastric pain, found to have NSTEMI.   1. CAD: NSTEMI.   Initially, patient had upper back and left arm pain that has resolved, still has mild epigastric discomfort (nontender).  No prior cardiac history.  ECG is nonspecific.  - Start heparin gtt.  - Continue ASA - Add atorvastatin 80 mg daily. - He is on Coreg at home, will hold for now as HR around 50.  - NTG gtt at low dose given ongoing mild epigastric discomfort.  - Discussed cardiac cath with patient and family.  They understand risks/benefits and agree to proceed.  Will need hydration pre-procedure with elevated creatinine.  2. Elevated creatinine: Uncertain baseline.  Creatinine currently 1.7.  Will hydrate pre-cath.  3. Dementia: He has some memory problems but is able to give history.   Loralie Champagne 02/17/2016 3:11 AM

## 2016-02-17 NOTE — H&P (View-Only) (Signed)
Patient ID: Daniel Schmitt, male   DOB: Jun 23, 1926, 82 y.o.   MRN: [id number] Admitted by Dr Aundra Dubin this am No chest pain Seems a bit confused Discussed cath willing to proceed  [name]

## 2016-02-18 ENCOUNTER — Encounter (HOSPITAL_COMMUNITY): Payer: Self-pay | Admitting: Interventional Cardiology

## 2016-02-18 ENCOUNTER — Telehealth: Payer: Self-pay | Admitting: Cardiology

## 2016-02-18 LAB — CBC
HEMATOCRIT: 31.3 % — AB (ref 39.0–52.0)
HEMOGLOBIN: 10.5 g/dL — AB (ref 13.0–17.0)
MCH: 31 pg (ref 26.0–34.0)
MCHC: 33.5 g/dL (ref 30.0–36.0)
MCV: 92.3 fL (ref 78.0–100.0)
Platelets: 167 10*3/uL (ref 150–400)
RBC: 3.39 MIL/uL — AB (ref 4.22–5.81)
RDW: 13.2 % (ref 11.5–15.5)
WBC: 11.4 10*3/uL — ABNORMAL HIGH (ref 4.0–10.5)

## 2016-02-18 LAB — BASIC METABOLIC PANEL
ANION GAP: 10 (ref 5–15)
BUN: 16 mg/dL (ref 6–20)
CALCIUM: 8.7 mg/dL — AB (ref 8.9–10.3)
CO2: 20 mmol/L — AB (ref 22–32)
Chloride: 110 mmol/L (ref 101–111)
Creatinine, Ser: 1.59 mg/dL — ABNORMAL HIGH (ref 0.61–1.24)
GFR, EST AFRICAN AMERICAN: 43 mL/min — AB (ref >60)
GFR, EST NON AFRICAN AMERICAN: 37 mL/min — AB (ref >60)
Glucose, Bld: 98 mg/dL (ref 65–99)
POTASSIUM: 3.7 mmol/L (ref 3.5–5.1)
Sodium: 140 mmol/L (ref 135–145)

## 2016-02-18 MED ORDER — NITROGLYCERIN 0.4 MG SL SUBL: 0.4000 mg | SUBLINGUAL_TABLET | SUBLINGUAL | Status: DC | PRN

## 2016-02-18 MED ORDER — TICAGRELOR 90 MG PO TABS: 90.0000 mg | ORAL_TABLET | Freq: Two times a day (BID) | ORAL | Status: DC

## 2016-02-18 MED ORDER — ASPIRIN 81 MG PO TBEC: 81.0000 mg | DELAYED_RELEASE_TABLET | Freq: Every day | ORAL | Status: DC

## 2016-02-18 MED ORDER — CARVEDILOL 12.5 MG PO TABS
12.5000 mg | ORAL_TABLET | Freq: Two times a day (BID) | ORAL | Status: DC
  Administered 2016-02-18: 12.5 mg via ORAL
  Filled 2016-02-18: qty 1

## 2016-02-18 MED ORDER — CARVEDILOL 12.5 MG PO TABS: 12.5000 mg | ORAL_TABLET | Freq: Two times a day (BID) | ORAL | Status: DC

## 2016-02-18 MED ORDER — ATORVASTATIN CALCIUM 80 MG PO TABS: 80.0000 mg | ORAL_TABLET | Freq: Every day | ORAL | Status: DC

## 2016-02-18 MED FILL — Verapamil HCl IV Soln 2.5 MG/ML: INTRAVENOUS | Qty: 2 | Status: AC

## 2016-02-18 NOTE — Progress Notes (Signed)
CARDIAC REHAB PHASE I   PRE:  Rate/Rhythm: 73 SR  BP:  Supine: 118/60  Sitting:   Standing:    SaO2: 95%RA  MODE:  Ambulation: 500 ft   POST:  Rate/Rhythm: 92 SR  BP:  Supine:   Sitting: 147/90  Standing:    SaO2: 97%RA 0750-0830 Pt walked 500 ft on RA with gait belt use and rolling walker and asst x 1. Tendency to get walker out in front too far. Sped up quickly once. Encouraged to slow down and stay close to walker. To recliner with chair alarm. Cut up sausage and encouraged pt to eat. Asked several times what happened to him. Will educate family when they arrive. Pt denied CP during walk.   Graylon Good, RN BSN  02/18/2016 8:27 AM

## 2016-02-18 NOTE — Telephone Encounter (Signed)
° ° °  TCM per [place] PA    7/6 @ 1:15 with Bonnell Public PA and Gay Filler 7/6 @ 1:30pm

## 2016-02-18 NOTE — Discharge Summary (Signed)
Discharge Summary    Patient ID: Daniel Schmitt,  MRN: [id number], DOB/AGE: 80/12/1925 80 y.o.  Admit date: 02/17/2016 Discharge date: 02/18/2016  Primary Care Provider: [name] Primary Cardiologist: Dr. Aundra Dubin, new  Discharge Diagnoses    Active Problems:   NSTEMI (non-ST elevated myocardial infarction) [facility])   Allergies Allergies  Allergen Reactions  . Aspirin Other (See Comments)    Stomach bleeds     Diagnostic Studies/Procedures  Coronary Stent Intervention 02/17/16 Left Heart Cath and Coronary Angiography   Mid RCA lesion, 95% stenosed. Post intervention with a 3.0 x 38 Synergy DES postdilated to 3.5 mm, there is a 0% residual stenosis.  Mildly increased LVEDP. Continuing aggressive hydration to prevent contrast-induced nephropathy.  Continue dual antiplatelet therapy for at least a year ideally. If he has bleeding problems, this may need to be shortened. Continue aggressive secondary prevention.  _____________   History of Present Illness   Daniel Schmitt is a 82 year old male with a past medical history of dementia, CKD, and GERD. No prior cardiac history. He presented to the ED on 02/17/16 with pain across his back with radiation to his left arm. He also felt like he had some indigestion pain in his stomach. Upon arrival to the ED, his troponin was 11.6, and EKG showed poor R wave progression but no ST elevation. He denied any associated SOB, nausea or diaphoresis.   Hospital Course  He was placed on heparin drip and admitted for further evaluation. His HR was in the 50's, so his home Coreg was placed on hold.  He went for left heart cath on 02/17/16. Cath report above. He received DES to his mid RCA. He will be on DAPT for one year with Brilinta and ASA. He has had GI bleed in the past with ASA, so we will monitor closely.   His baseline creatinine is hard to determine from labs available. It was 1.59, which was improved from admission. He was hydrated post  cath. His Coreg was resumed. He will remain on his high intensity statin.  He is confused at baseline, has known dementia. He was oriented to self and pleasant during admission. He was disoriented to situation.   He was seen today by Dr. Johnsie Cancel and deemed suitable for discharge.  _____________  Discharge Vitals Blood pressure 118/60, pulse 61, temperature 98 F (36.7 C), temperature source Oral, resp. rate 19, height 5\' 3"  (1.6 m), weight 121 lb 4.1 oz (55 kg), SpO2 98 %.  Filed Weights   02/16/16 2003 02/17/16 0503 02/18/16 0100  Weight: 125 lb (56.7 kg) 121 lb 7.6 oz (55.1 kg) 121 lb 4.1 oz (55 kg)    Labs & Radiologic Studies     CBC  Recent Labs  02/16/16 2027 02/18/16 0526  WBC 8.9 11.4*  NEUTROABS 5.9  --   HGB 11.6* 10.5*  HCT 35.1* 31.3*  MCV 92.4 92.3  PLT 189 [id number]   Basic Metabolic Panel  Recent Labs  02/17/16 0708 02/18/16 0526  NA 142 140  K 4.2 3.7  CL 109 110  CO2 24 20*  GLUCOSE 103* 98  BUN 18 16  CREATININE 1.74* 1.59*  CALCIUM 9.4 8.7*   Cardiac Enzymes  Recent Labs  02/17/16 0807 02/17/16 1552 02/17/16 2212  TROPONINI 39.82* 51.91* 41.27*   Fasting Lipid Panel  Recent Labs  02/17/16 0708  CHOL 120  HDL 51  LDLCALC 59  TRIG 50  CHOLHDL 2.4    Dg Chest 2  View  02/17/2016  CLINICAL DATA:  82 year old male with chest pain EXAM: CHEST  2 VIEW COMPARISON:  Chest radiograph dated 02/11/2013 FINDINGS: The heart size and mediastinal contours are within normal limits. Both lungs are clear. The visualized skeletal structures are unremarkable. IMPRESSION: No active cardiopulmonary disease. Electronically Signed   By: Anner Crete M.D.   On: 02/17/2016 02:43    Disposition   Pt is being discharged home today in good condition.  Follow-up Plans & Appointments    Follow-up Information    Follow up with Ermalinda Barrios, PA-C On 02/26/2016.   Specialty:  Cardiology   Why:  at 11:45 pm for follow up, also will see Pharmacist during that  visit at 12:00.    Contact information:   [address] [place] [address] [phone]        Discharge Medications   Current Discharge Medication List    CONTINUE these medications which have NOT CHANGED   Details  acetaminophen (TYLENOL) 500 MG tablet Take 1,000 mg by mouth every 6 (six) hours as needed for pain.    amLODipine (NORVASC) 10 MG tablet Take 10 mg by mouth daily.    Ascorbic Acid (VITAMIN C PO) Take 1 tablet by mouth daily.    calcium carbonate (TUMS - DOSED IN MG ELEMENTAL CALCIUM) 500 MG chewable tablet Chew 1 tablet by mouth daily.    carvedilol (COREG) 25 MG tablet Take 25 mg by mouth 2 (two) times daily with a meal.    Cholecalciferol (VITAMIN D PO) Take 1 tablet by mouth daily.    Cyanocobalamin (VITAMIN B-12 PO) Take 1 tablet by mouth daily.    donepezil (ARICEPT) 5 MG tablet Take 5 mg by mouth at bedtime.    Multiple Vitamins-Minerals (MULTIVITAMIN WITH MINERALS) tablet Take 1 tablet by mouth daily.    ranitidine (ZANTAC) 150 MG tablet Take 150 mg by mouth daily as needed for heartburn.     VITAMIN E PO Take 1 tablet by mouth daily.         Aspirin prescribed at discharge?  Yes High Intensity Statin Prescribed? Yes Beta Blocker Prescribed? Yes For EF 45% or less, Was ACEI/ARB Prescribed? EF not assessed ADP Receptor Inhibitor Prescribed?Yes For EF <40%, Aldosterone Inhibitor Prescribed? Ef not assessed Was EF assessed during THIS hospitalization? No Was Cardiac Rehab II ordered? Yes Outstanding Labs/Studies  CBC, BMP  Duration of Discharge Encounter   Greater than 30 minutes including physician time.  Signed, Arbutus Leas NP 02/18/2016, 10:02 AM

## 2016-02-18 NOTE — Progress Notes (Signed)
[id number] MI education completed with pt's wife and pt. Understanding voiced. Stressed importance of brilinta with stent. Reviewed MI restrictions, NTG use, watching sodium and activity. Will refer to [place] CRP 2. Pt uses leg ergometer mostly for ex per wife. Very picky eater so just encouraged to try to watch salt. Has brilinta card. Graylon Good RN BSN 02/18/2016 11:23 AM

## 2016-02-18 NOTE — Care Management Note (Addendum)
Case Management Note  Patient Details  Name: MARNELL PARRILL MRN: [id number] Date of Birth: 11-27-1925  Subjective/Objective:                 Patient admitted with NSTEMI. Lives at home with wife. Cardiac cath completed. Anticipate patient will DC to home with wife when medically stable. Patient given 30 day Brilinta card. Benefit check pending. Verified that [facility] on [address] has med in stock, explained card to wife at bedside.   Action/Plan:   Expected Discharge Date:                  Expected Discharge Plan:  Home/Self Care  In-House Referral:     Discharge planning Services  CM Consult, Medication Assistance  Post Acute Care Choice:  NA Choice offered to:     DME Arranged:    DME Agency:     HH Arranged:    HH Agency:     Status of Service:  In process, will continue to follow  If discussed at Long Length of Stay Meetings, dates discussed:    Additional Comments:  Carles Collet, RN 02/18/2016, 10:03 AM

## 2016-02-18 NOTE — Progress Notes (Signed)
Patient Name: Daniel Schmitt Date of Encounter: 02/18/2016  Active Problems:   NSTEMI (non-ST elevated myocardial infarction) [facility])   Primary Cardiologist: New - Dr. Aundra Dubin Patient Profile: 82 yo with history of dementia was admitted with upper back and epigastric pain, found to have NSTEMI. No prior cardiac history. Left heart cath on 02/17/16, received DES to mid RCA.   SUBJECTIVE: Feels well, denies chest pain and SOB. Oriented to self and place, not to situation.    OBJECTIVE Filed Vitals:   02/17/16 2300 02/18/16 0100 02/18/16 0200 02/18/16 0410  BP: 128/82 138/52 125/43 102/35  Pulse: 68 68 61 64  Temp:  98.1 F (36.7 C)  98.3 F (36.8 C)  TempSrc:  Oral  Oral  Resp: 29 23 21 17   Height:      Weight:  121 lb 4.1 oz (55 kg)    SpO2: 99% 99% 99% 98%    Intake/Output Summary (Last 24 hours) at 02/18/16 0746 Last data filed at 02/18/16 [id number]  Gross per 24 hour  Intake 705.67 ml  Output    950 ml  Net -244.33 ml   Filed Weights   02/16/16 2003 02/17/16 0503 02/18/16 0100  Weight: 125 lb (56.7 kg) 121 lb 7.6 oz (55.1 kg) 121 lb 4.1 oz (55 kg)    PHYSICAL EXAM General: Well developed, well nourished, male in no acute distress. Head: Normocephalic, atraumatic.  Neck: Supple without bruits, no JVD. Lungs:  Resp regular and unlabored, CTA. Heart: RRR, S1, S2, no S3, S4, or murmur; no rub. Abdomen: Soft, non-tender, non-distended, BS + x 4.  Extremities: No clubbing, cyanosis, no edema.  Neuro: Alert and oriented to self and place. Unclear of what he had done yesterday, cannot name President correctly. . Moves all extremities spontaneously. Psych: Normal affect.  LABS: CBC: Recent Labs  02/16/16 2027 02/18/16 0526  WBC 8.9 11.4*  NEUTROABS 5.9  --   HGB 11.6* 10.5*  HCT 35.1* 31.3*  MCV 92.4 92.3  PLT 189 167   INR: Recent Labs  02/17/16 0708  INR [id number]   Basic Metabolic Panel: Recent Labs  02/17/16 0708 02/18/16 0526  NA 142 140  K 4.2 3.7   CL 109 110  CO2 24 20*  GLUCOSE 103* 98  BUN 18 16  CREATININE 1.74* 1.59*  CALCIUM 9.4 8.7*   Cardiac Enzymes: Recent Labs  02/17/16 0807 02/17/16 1552 02/17/16 2212  TROPONINI 39.82* 51.91* 41.27*    Recent Labs  02/17/16 0203  TROPIPOC 11.62*   Fasting Lipid Panel: Recent Labs  02/17/16 0708  CHOL 120  HDL 51  LDLCALC 59  TRIG 50  CHOLHDL 2.4    Current facility-administered medications:  .  0.9 %  sodium chloride infusion, 250 mL, Intravenous, PRN, Jettie Booze, MD .  0.9 %  sodium chloride infusion, , Intravenous, Once, Jettie Booze, MD, Last Rate: 55 mL/hr at 02/17/16 1341 .  acetaminophen (TYLENOL) tablet 650 mg, 650 mg, Oral, Q4H PRN, Larey Dresser, MD .  acetaminophen (TYLENOL) tablet 650 mg, 650 mg, Oral, Q4H PRN, Jettie Booze, MD, 650 mg at 02/18/16 0059 .  aspirin EC tablet 81 mg, 81 mg, Oral, Daily, Larey Dresser, MD .  atorvastatin (LIPITOR) tablet 80 mg, 80 mg, Oral, q1800, Larey Dresser, MD, 80 mg at 02/17/16 1716 .  donepezil (ARICEPT) tablet 5 mg, 5 mg, Oral, QHS, Jettie Booze, MD, 5 mg at 02/17/16 2144 .  famotidine (PEPCID) tablet 20  mg, 20 mg, Oral, Daily, Jettie Booze, MD, 20 mg at 02/17/16 1716 .  nitroGLYCERIN (NITROSTAT) SL tablet 0.4 mg, 0.4 mg, Sublingual, Q5 Min x 3 PRN, Larey Dresser, MD .  ondansetron [facility]) injection 4 mg, 4 mg, Intravenous, Q6H PRN, Jettie Booze, MD .  sodium chloride flush (NS) 0.9 % injection 3 mL, 3 mL, Intravenous, Q12H, Jettie Booze, MD, 3 mL at 02/17/16 1717 .  sodium chloride flush (NS) 0.9 % injection 3 mL, 3 mL, Intravenous, PRN, Jettie Booze, MD .  ticagrelor [facility]) tablet 90 mg, 90 mg, Oral, BID, Jettie Booze, MD, 90 mg at 02/17/16 2357    TELE:   NSR     ECG: NSR with 1 degree AVB  Radiology/Studies: Dg Chest 2 View  02/17/2016  CLINICAL DATA:  82 year old male with chest pain EXAM: CHEST  2 VIEW COMPARISON:  Chest radiograph  dated 02/11/2013 FINDINGS: The heart size and mediastinal contours are within normal limits. Both lungs are clear. The visualized skeletal structures are unremarkable. IMPRESSION: No active cardiopulmonary disease. Electronically Signed   By: Anner Crete M.D.   On: 02/17/2016 02:43     Current Medications:  . sodium chloride   Intravenous Once  . aspirin EC  81 mg Oral Daily  . atorvastatin  80 mg Oral q1800  . donepezil  5 mg Oral QHS  . famotidine  20 mg Oral Daily  . sodium chloride flush  3 mL Intravenous Q12H  . ticagrelor  90 mg Oral BID      ASSESSMENT AND PLAN: Active Problems:   NSTEMI (non-ST elevated myocardial infarction) ([place])  1. NSTEMI: Had left heart cath yesterday, DES to mid RCA. Troponin 41.27 last night. Denies chest pain. Will need DAPT for at least one year. He has had GI bleed in the past, will watch cautiously. He was previously on Coreg at home, it was discontinued on admission for bradycardia. His HR is been in the low 60's. MD to advise adding low dose beta blocker post MI.   2. HLD: Continue high intensity statin. LDL is 59.   3. CKD stage III: Creatinine is 1.59 today which is improved, was hydrated post cath. Unclear what baseline is.     Signed, Arbutus Leas , NP 7:46 AM 02/18/2016 Pager [phone]  Patient doing well confusion clearing the longer he is up.  SEM Lungs clear cath sight A Cr stable post dye load DES RCA  DAT for a year.  Family coming in this am to bring him Home  Ambulated with rehab with no issues eating breakfast well  [name]

## 2016-02-19 ENCOUNTER — Encounter: Payer: Self-pay | Admitting: Physician Assistant

## 2016-02-19 NOTE — Telephone Encounter (Signed)
Patient contacted regarding discharge from [facility] on 02/18/2016.  Patient understands to follow up with provider Estella Husk PA on 02/26/2016 at 1:15 pm at [address] Patient understands discharge instructions? yes Patient understands medications and regiment? yes Patient understands to bring all medications to this visit? yes

## 2016-02-26 ENCOUNTER — Encounter: Payer: Self-pay | Admitting: Physician Assistant

## 2016-02-26 ENCOUNTER — Ambulatory Visit (INDEPENDENT_AMBULATORY_CARE_PROVIDER_SITE_OTHER): Payer: Medicare Other | Admitting: Pharmacist

## 2016-02-26 ENCOUNTER — Encounter (INDEPENDENT_AMBULATORY_CARE_PROVIDER_SITE_OTHER): Payer: Self-pay

## 2016-02-26 ENCOUNTER — Ambulatory Visit (INDEPENDENT_AMBULATORY_CARE_PROVIDER_SITE_OTHER): Payer: Medicare Other | Admitting: Physician Assistant

## 2016-02-26 VITALS — BP 128/70 | HR 57 | Ht 63.0 in | Wt 117.1 lb

## 2016-02-26 DIAGNOSIS — Z8719 Personal history of other diseases of the digestive system: Secondary | ICD-10-CM | POA: Diagnosis not present

## 2016-02-26 DIAGNOSIS — N189 Chronic kidney disease, unspecified: Secondary | ICD-10-CM | POA: Insufficient documentation

## 2016-02-26 DIAGNOSIS — I214 Non-ST elevation (NSTEMI) myocardial infarction: Secondary | ICD-10-CM

## 2016-02-26 DIAGNOSIS — E785 Hyperlipidemia, unspecified: Secondary | ICD-10-CM

## 2016-02-26 DIAGNOSIS — Z79899 Other long term (current) drug therapy: Secondary | ICD-10-CM

## 2016-02-26 DIAGNOSIS — I2510 Atherosclerotic heart disease of native coronary artery without angina pectoris: Secondary | ICD-10-CM

## 2016-02-26 DIAGNOSIS — N183 Chronic kidney disease, stage 3 (moderate): Secondary | ICD-10-CM

## 2016-02-26 MED ORDER — PANTOPRAZOLE SODIUM 40 MG PO TBEC: 40.0000 mg | DELAYED_RELEASE_TABLET | Freq: Every day | ORAL | Status: DC

## 2016-02-26 NOTE — Progress Notes (Signed)
Cardiology Office Note    Date:  02/26/2016   ID:  Daniel Schmitt, DOB 1926-08-15, MRN [id number]  PCP:  [name], MD  Cardiologist: Dr. Aundra Dubin  Chief Complaint  Patient presents with  . Hospitalization Follow-up    History of Present Illness:  Daniel Schmitt is a 82 y.o. male  with history of dementia, CKD, GERD admitted with NSTEMI treated with DES to the mid RCA. Continue dual antiplatelet therapy for at least a year ideally. If he has bleeding problem of this may need to be shortened. He has history of GI bleed in the past with aspirin.  He comes in today accompanied by his significant other. He was having some shortness of breath with the Brilinta which eased with caffeine. It is getting better. He denies any chest pain, palpitations, dyspnea on exertion, dizziness or presyncope. He has had no further bleeding problems. He did bruise along his whole arm above cath site but this is resolving. He is being evaluated by Gay Filler the pharmacist for his medications and she recommends stopping Zantac and starting Protonix for better protection.   Past Medical History  Diagnosis Date  . Vertigo   . NSTEMI (non-ST elevation myocardial infarction) ([place]) 01/2016    a.3.0 x 38 Synergy DES to mid RCA 01/2016    Past Surgical History  Procedure Laterality Date  . Hernia repair    . Cardiac catheterization N/A 02/17/2016    Procedure: Left Heart Cath and Coronary Angiography;  Surgeon: Jettie Booze, MD;  Location: [facility];  Service: Cardiovascular;  Laterality: N/A;  . Cardiac catheterization N/A 02/17/2016    Procedure: Coronary Stent Intervention;  Surgeon: Jettie Booze, MD;  Location: [facility];  Service: Cardiovascular;  Laterality: N/A;    Current Medications: Outpatient Prescriptions Prior to Visit  Medication Sig Dispense Refill  . acetaminophen (TYLENOL) 500 MG tablet Take 1,000 mg by mouth every 6 (six) hours as needed for pain.    Marland Kitchen amLODipine  (NORVASC) 10 MG tablet Take 10 mg by mouth daily.    . Ascorbic Acid (VITAMIN C PO) Take 1 tablet by mouth daily.    Marland Kitchen aspirin EC 81 MG EC tablet Take 1 tablet (81 mg total) by mouth daily.    Marland Kitchen atorvastatin (LIPITOR) 80 MG tablet Take 1 tablet (80 mg total) by mouth daily at 6 PM. 30 tablet 12  . calcium carbonate (TUMS - DOSED IN MG ELEMENTAL CALCIUM) 500 MG chewable tablet Chew 1 tablet by mouth daily.    . carvedilol (COREG) 12.5 MG tablet Take 1 tablet (12.5 mg total) by mouth 2 (two) times daily with a meal. 60 tablet 12  . Cholecalciferol (VITAMIN D PO) Take 1 tablet by mouth daily.    . Cyanocobalamin (VITAMIN B-12 PO) Take 1 tablet by mouth daily.    Marland Kitchen donepezil (ARICEPT) 5 MG tablet Take 5 mg by mouth at bedtime.    . Multiple Vitamins-Minerals (MULTIVITAMIN WITH MINERALS) tablet Take 1 tablet by mouth daily.    . nitroGLYCERIN (NITROSTAT) 0.4 MG SL tablet Place 1 tablet (0.4 mg total) under the tongue every 5 (five) minutes x 3 doses as needed for chest pain. 25 tablet 1  . ticagrelor (BRILINTA) 90 MG TABS tablet Take 1 tablet (90 mg total) by mouth 2 (two) times daily. 60 tablet 0  . VITAMIN E PO Take 1 tablet by mouth daily.    . ranitidine (ZANTAC) 150 MG tablet Take 150 mg  by mouth daily as needed for heartburn.     . ticagrelor (BRILINTA) 90 MG TABS tablet Take 1 tablet (90 mg total) by mouth 2 (two) times daily. 60 tablet 12   No facility-administered medications prior to visit.     Allergies:   Aspirin   Social History   Social History  . Marital Status: Married    Spouse Name: N/A  . Number of Children: N/A  . Years of Education: N/A   Social History Main Topics  . Smoking status: Former Research scientist (life sciences)  . Smokeless tobacco: Never Used     Comment: QUIT SMOKING IN THE LATE 60'S  . Alcohol Use: No  . Drug Use: No  . Sexual Activity: Not Asked   Other Topics Concern  . None   Social History Narrative     Family History:  The patient's   family history is not on  file.   ROS:   Please see the history of present illness.    Review of Systems  Unable to perform ROS: dementia   All other systems reviewed and are negative.   PHYSICAL EXAM:   VS:  BP 128/70 mmHg  Pulse 57  Ht 5\' 3"  (1.6 m)  Wt 117 lb 1.9 oz (53.125 kg)  BMI 20.75 kg/m2  Physical Exam  GEN: Well nourished, well developed, in no acute distress  Neck: no JVD, carotid bruits, or masses Cardiac:RRR; no murmurs, rubs, or gallops  Respiratory:  clear to auscultation bilaterally, normal work of breathing GI: soft, nontender, nondistended, + BS Ext: Right arm bruised above cath site good radial and brachial pulses no hematoma, lower extremities without cyanosis, clubbing, or edema, Good distal pulses bilaterally MS: no deformity or atrophy Skin: warm and dry, no rash Neuro:  Alert and Oriented x 3, Strength and sensation are intact Psych: euthymic mood, full affect  Wt Readings from Last 3 Encounters:  02/26/16 117 lb 1.9 oz (53.125 kg)  02/18/16 121 lb 4.1 oz (55 kg)      Studies/Labs Reviewed:   EKG:  EKG is not ordered today.   Recent Labs: 02/18/2016: BUN 16; Creatinine, Ser 1.59*; Hemoglobin 10.5*; Platelets 167; Potassium 3.7; Sodium 140   Lipid Panel    Component Value Date/Time   CHOL 120 02/17/2016 0708   TRIG 50 02/17/2016 0708   HDL 51 02/17/2016 0708   CHOLHDL 2.4 02/17/2016 0708   VLDL 10 02/17/2016 0708   LDLCALC 59 02/17/2016 0708    Additional studies/ records that were reviewed today include:  Left heart catheterization 02/17/16 Conclusion      Mid RCA lesion, 95% stenosed. Post intervention with a 3.0 x 38 Synergy DES postdilated to 3.5 mm, there is a 0% residual stenosis.  Mildly increased LVEDP. Continuing aggressive hydration to prevent contrast-induced nephropathy.   Continue dual antiplatelet therapy for at least a year ideally. If he has bleeding problems, this may need to be shortened.  Continue aggressive secondary prevention.        ASSESSMENT:    1. Coronary artery disease involving native coronary artery of native heart without angina pectoris   2. Encounter for long-term (current) use of other high-risk medications   3. History of GI bleed   4. CKD (chronic kidney disease), stage 3 (moderate)   5. Hyperlipemia      PLAN:  In order of problems listed above:  CAD status post recent MI treated with DES to the RCA now on Brilinta and aspirin. Having some shortness of breath with  the Brilinta improved with caffeine. Continue for now. Stop Zantac and start Protonix 40 mg daily with history of GI bleed.check 2Decho for LV function.   Encounter for long-term use of high-risk medications being evaluated by Gay Filler pharmacist  History of GI bleed hemoglobin 10.5 in the hospital we'll check CBC today denies any bleeding  Hyperlipidemia on Lipitor check fasting lipid panel and LFTs in 4-6 weeks.  CKD check renal function today   Medication Adjustments/Labs and Tests Ordered: Current medicines are reviewed at length with the patient today.  Concerns regarding medicines are outlined above.  Medication changes, Labs and Tests ordered today are listed in the Patient Instructions below. Patient Instructions  Medication Instructions:   Your physician has recommended you make the following change in your medication: STOP RANITIDINE  START PANOTPRAZOLE 40 MG EVERY DAY    Labwork: TODAY  BMET  CBC  AND  6 WEEKS LIPID LIVER   Testing/Procedures: Your physician has requested that you have an echocardiogram. Echocardiography is a painless test that uses sound waves to create images of your heart. It provides your doctor with information about the size and shape of your heart and how well your heart's chambers and valves are working. This procedure takes approximately one hour. There are no restrictions for this procedure.   Follow-Up: Your physician wants you to follow-up in: 2 [place] will  receive a reminder letter in the mail two months in advance. If you don't receive a letter, please call our office to schedule the follow-up appointment.  Any Other Special Instructions Will Be Listed Below (If Applicable).     If you need a refill on your cardiac medications before your next appointment, please call your pharmacy.       Signed, Ermalinda Barrios, PA-C  02/26/2016 1:38 PM    [facility] [place], [place], [place]  [address] Phone: [phone]; Fax: [phone]

## 2016-02-26 NOTE — Patient Instructions (Addendum)
Medication Instructions:   Your physician has recommended you make the following change in your medication: STOP RANITIDINE  START PANTOPRAZOLE 40 MG EVERY DAY    Labwork: TODAY  BMET  CBC  AND  6 WEEKS LIPID LIVER   Testing/Procedures: Your physician has requested that you have an echocardiogram. Echocardiography is a painless test that uses sound waves to create images of your heart. It provides your doctor with information about the size and shape of your heart and how well your heart's chambers and valves are working. This procedure takes approximately one hour. There are no restrictions for this procedure.   Follow-Up: Your physician wants you to follow-up in: 2 [place] will receive a reminder letter in the mail two months in advance. If you don't receive a letter, please call our office to schedule the follow-up appointment.  Any Other Special Instructions Will Be Listed Below (If Applicable).     If you need a refill on your cardiac medications before your next appointment, please call your pharmacy.

## 2016-02-26 NOTE — Progress Notes (Signed)
Patient ID: Daniel Schmitt                 DOB: 26-Apr-1926                      MRN: [id number]    Pharmacy Transitions of Care Visit  HPI: Daniel Schmitt is a 82 y.o. male admitted from 6/26-6/28 with primary diagnosis of NSTEMI. PMH is significant for dementia, CKD and GERD. He had no prior cardiac history.  He went to the ED on 6/27 after he had some pain in his back that radiated down his arm.  His troponin was elevated at 11.6 but no ST elevation.  He had a left heart cath on 6/27 and received DES to mid RCA.  He was placed on Brilinta and ASA x 1 year.  (he does have a history of GI bleed on ASA in the past).   Patient presents to clinic for pharmacy transitions of care medication reconciliation after hospital discharge.  All medications have been reviewed with patient.  He is on a beta-blocker, high intensity statin, dual antiplatelet therapy.  No ACE-I or ARB.  Pts SCr at discharge was 1.59.  Unsure what baseline is.  LDL was 59 mg/dL.  Hgb- 10.5   Pt is here today with his wife.  She takes care of all of his medications.    Past Medical History  Diagnosis Date  . Vertigo   . NSTEMI (non-ST elevation myocardial infarction) ([place]) 01/2016    a.3.0 x 38 Synergy DES to mid RCA 01/2016    Current Outpatient Prescriptions on File Prior to Visit  Medication Sig Dispense Refill  . acetaminophen (TYLENOL) 500 MG tablet Take 1,000 mg by mouth every 6 (six) hours as needed for pain.    Marland Kitchen amLODipine (NORVASC) 10 MG tablet Take 10 mg by mouth daily.    . Ascorbic Acid (VITAMIN C PO) Take 1 tablet by mouth daily.    Marland Kitchen aspirin EC 81 MG EC tablet Take 1 tablet (81 mg total) by mouth daily.    Marland Kitchen atorvastatin (LIPITOR) 80 MG tablet Take 1 tablet (80 mg total) by mouth daily at 6 PM. 30 tablet 12  . calcium carbonate (TUMS - DOSED IN MG ELEMENTAL CALCIUM) 500 MG chewable tablet Chew 1 tablet by mouth daily.    . carvedilol (COREG) 12.5 MG tablet Take 1 tablet (12.5 mg total) by mouth 2 (two) times  daily with a meal. 60 tablet 12  . Cholecalciferol (VITAMIN D PO) Take 1 tablet by mouth daily.    . Cyanocobalamin (VITAMIN B-12 PO) Take 1 tablet by mouth daily.    Marland Kitchen donepezil (ARICEPT) 5 MG tablet Take 5 mg by mouth at bedtime.    . Multiple Vitamins-Minerals (MULTIVITAMIN WITH MINERALS) tablet Take 1 tablet by mouth daily.    . nitroGLYCERIN (NITROSTAT) 0.4 MG SL tablet Place 1 tablet (0.4 mg total) under the tongue every 5 (five) minutes x 3 doses as needed for chest pain. 25 tablet 1  . pantoprazole (PROTONIX) 40 MG tablet Take 1 tablet (40 mg total) by mouth daily. 30 tablet 11  . ticagrelor (BRILINTA) 90 MG TABS tablet Take 1 tablet (90 mg total) by mouth 2 (two) times daily. 60 tablet 0  . VITAMIN E PO Take 1 tablet by mouth daily.     No current facility-administered medications on file prior to visit.    Allergies  Allergen Reactions  . Aspirin Other (See Comments)  Stomach bleeds     Assessment/Plan:  Issues/concerns noted are as follows: 1.  Given history of GI bleed on ASA and hx of GERD, suggest changing from H2 antagonist to PPI for better GI protection while on dual antiplatelet therapy.  Verified with patient's wife he is taking an enteric coated aspirin.  PA plans to recheck CBC today as well.  3. EF not assessed during hospital stay.  Unsure if he would benefit from a ACE-I inhibitor from a cardiac standpoint.  He does have CKD but no baseline SCr available.  BP stable.  Would suggest evaluating EF and if normal, defer ACE-I/ARB decision to PCP.

## 2016-03-17 ENCOUNTER — Encounter (HOSPITAL_COMMUNITY): Payer: Self-pay | Admitting: *Deleted

## 2016-04-09 ENCOUNTER — Ambulatory Visit (HOSPITAL_COMMUNITY): Payer: Medicare Other | Attending: Physician Assistant

## 2016-04-09 ENCOUNTER — Encounter: Payer: Self-pay | Admitting: Cardiology

## 2016-04-09 ENCOUNTER — Other Ambulatory Visit: Payer: Medicare Other | Admitting: *Deleted

## 2016-04-09 ENCOUNTER — Other Ambulatory Visit: Payer: Self-pay

## 2016-04-09 DIAGNOSIS — N189 Chronic kidney disease, unspecified: Secondary | ICD-10-CM | POA: Diagnosis not present

## 2016-04-09 DIAGNOSIS — Z8719 Personal history of other diseases of the digestive system: Secondary | ICD-10-CM

## 2016-04-09 DIAGNOSIS — I214 Non-ST elevation (NSTEMI) myocardial infarction: Secondary | ICD-10-CM

## 2016-04-09 DIAGNOSIS — Z87891 Personal history of nicotine dependence: Secondary | ICD-10-CM | POA: Diagnosis not present

## 2016-04-09 DIAGNOSIS — N181 Chronic kidney disease, stage 1: Secondary | ICD-10-CM

## 2016-04-09 DIAGNOSIS — I2510 Atherosclerotic heart disease of native coronary artery without angina pectoris: Secondary | ICD-10-CM | POA: Diagnosis not present

## 2016-04-09 DIAGNOSIS — I252 Old myocardial infarction: Secondary | ICD-10-CM | POA: Diagnosis not present

## 2016-04-09 DIAGNOSIS — E785 Hyperlipidemia, unspecified: Secondary | ICD-10-CM | POA: Diagnosis not present

## 2016-04-09 DIAGNOSIS — I340 Nonrheumatic mitral (valve) insufficiency: Secondary | ICD-10-CM | POA: Insufficient documentation

## 2016-04-09 LAB — CBC WITH DIFFERENTIAL/PLATELET
BASOS ABS: 0 {cells}/uL (ref 0–200)
Basophils Relative: 0 %
Eosinophils Absolute: 303 cells/uL (ref 15–500)
Eosinophils Relative: 3 %
HEMATOCRIT: 35.2 % — AB (ref 38.5–50.0)
HEMOGLOBIN: 12 g/dL — AB (ref 13.2–17.1)
LYMPHS ABS: 2222 {cells}/uL (ref 850–3900)
LYMPHS PCT: 22 %
MCH: 31.7 pg (ref 27.0–33.0)
MCHC: 34.1 g/dL (ref 32.0–36.0)
MCV: 93.1 fL (ref 80.0–100.0)
MONO ABS: 1010 {cells}/uL — AB (ref 200–950)
MPV: 11.4 fL (ref 7.5–12.5)
Monocytes Relative: 10 %
NEUTROS PCT: 65 %
Neutro Abs: 6565 cells/uL (ref 1500–7800)
Platelets: 223 10*3/uL (ref 140–400)
RBC: 3.78 MIL/uL — AB (ref 4.20–5.80)
RDW: 15.3 % — AB (ref 11.0–15.0)
WBC: 10.1 10*3/uL (ref 3.8–10.8)

## 2016-04-09 LAB — BASIC METABOLIC PANEL
BUN: 17 mg/dL (ref 7–25)
CALCIUM: 9.1 mg/dL (ref 8.6–10.3)
CO2: 22 mmol/L (ref 20–31)
Chloride: 110 mmol/L (ref 98–110)
Creat: 1.48 mg/dL — ABNORMAL HIGH (ref 0.70–1.11)
GLUCOSE: 96 mg/dL (ref 65–99)
POTASSIUM: 4.1 mmol/L (ref 3.5–5.3)
Sodium: 143 mmol/L (ref 135–146)

## 2016-04-09 LAB — HEPATIC FUNCTION PANEL
ALBUMIN: 3.8 g/dL (ref 3.6–5.1)
ALT: 16 U/L (ref 9–46)
AST: 22 U/L (ref 10–35)
Alkaline Phosphatase: 77 U/L (ref 40–115)
BILIRUBIN DIRECT: 0.3 mg/dL — AB (ref <=0.2)
Indirect Bilirubin: 0.7 mg/dL (ref 0.2–1.2)
TOTAL PROTEIN: 6.7 g/dL (ref 6.1–8.1)
Total Bilirubin: 1 mg/dL (ref 0.2–1.2)

## 2016-04-09 MED ORDER — TICAGRELOR 90 MG PO TABS: 90.0000 mg | ORAL_TABLET | Freq: Two times a day (BID) | ORAL | 3 refills | Status: DC

## 2016-04-09 NOTE — Addendum Note (Signed)
Addended by: Eulis Foster on: 04/09/2016 12:00 PM   Modules accepted: Orders

## 2016-04-23 ENCOUNTER — Ambulatory Visit (INDEPENDENT_AMBULATORY_CARE_PROVIDER_SITE_OTHER): Payer: Medicare Other | Admitting: Cardiology

## 2016-04-23 ENCOUNTER — Encounter: Payer: Self-pay | Admitting: Cardiology

## 2016-04-23 VITALS — BP 118/56 | HR 67 | Ht 63.0 in | Wt 113.0 lb

## 2016-04-23 DIAGNOSIS — E785 Hyperlipidemia, unspecified: Secondary | ICD-10-CM

## 2016-04-23 DIAGNOSIS — I2510 Atherosclerotic heart disease of native coronary artery without angina pectoris: Secondary | ICD-10-CM

## 2016-04-23 DIAGNOSIS — I214 Non-ST elevation (NSTEMI) myocardial infarction: Secondary | ICD-10-CM | POA: Diagnosis not present

## 2016-04-23 DIAGNOSIS — N183 Chronic kidney disease, stage 3 (moderate): Secondary | ICD-10-CM | POA: Diagnosis not present

## 2016-04-23 DIAGNOSIS — Z8719 Personal history of other diseases of the digestive system: Secondary | ICD-10-CM | POA: Diagnosis not present

## 2016-04-23 NOTE — Patient Instructions (Signed)
Medication Instructions:  Your physician recommends that you continue on your current medications as directed. Please refer to the Current Medication list given to you today.  Labwork: None ordered  Testing/Procedures: None ordered  Follow-Up: Your physician recommends that you schedule a follow-up appointment in: 2 months with Estella Husk.   If you need a refill on your cardiac medications before your next appointment, please call your pharmacy.

## 2016-04-25 NOTE — Progress Notes (Signed)
PCP: Dr. Maudie Mercury  82 yo with CKD, dementia, and CAD s/p MI in 6/17 presents for cardiology followup.  He was admitted with NSTEMI in 6/17 and had DES to RCA.    History is difficult given poor memory, most information comes from daughter.  No chest pain.  He has had some dyspnea since PCI.  It seems to be related to Brilinta use and is better when he takes caffeine with Brilinta.  Echo in 8/17 showed EF 60-65% on echo.  He is not very active, mainly sits on porch per daughter.   ECG: NSR, inferior and anterolateral T wave inversions, old inferior MI  Labs (6/17): LDL 59 Labs (8/17): LFTs normal, HCT 35.2, K 4.1, creatinine 1.48  PMH: 1. Dementia 2. CKD 3. GERD 4. H/o TIA 5. H/o GI bleeding 6. CAD: NSTEMI 6/17 with DES to mid RCA.  - Echo (8/17) with EF 123456, grade I diastolic dysfunction.   FH: No premature CAD.  Social History   Social History  . Marital status: Married    Spouse name: N/A  . Number of children: N/A  . Years of education: N/A   Occupational History  . Not on file.   Social History Main Topics  . Smoking status: Former Research scientist (life sciences)  . Smokeless tobacco: Never Used     Comment: QUIT SMOKING IN THE LATE 60'S  . Alcohol use No  . Drug use: No  . Sexual activity: Not on file   Other Topics Concern  . Not on file   Social History Narrative  . No narrative on file   ROS: All systems reviewed and negative except as per HPI.   Current Outpatient Prescriptions  Medication Sig Dispense Refill  . acetaminophen (TYLENOL) 500 MG tablet Take 1,000 mg by mouth every 6 (six) hours as needed for pain.    . Ascorbic Acid (VITAMIN C PO) Take 1 tablet by mouth daily.    Marland Kitchen aspirin EC 81 MG EC tablet Take 1 tablet (81 mg total) by mouth daily.    Marland Kitchen atorvastatin (LIPITOR) 80 MG tablet Take 1 tablet (80 mg total) by mouth daily at 6 PM. 30 tablet 12  . carvedilol (COREG) 12.5 MG tablet Take 1 tablet (12.5 mg total) by mouth 2 (two) times daily with a meal. 60 tablet 12  .  donepezil (ARICEPT) 5 MG tablet Take 5 mg by mouth at bedtime.    . nitroGLYCERIN (NITROSTAT) 0.4 MG SL tablet Place 1 tablet (0.4 mg total) under the tongue every 5 (five) minutes x 3 doses as needed for chest pain. 25 tablet 1  . ticagrelor (BRILINTA) 90 MG TABS tablet Take 1 tablet (90 mg total) by mouth 2 (two) times daily. 180 tablet 3   No current facility-administered medications for this visit.    BP (!) 118/56   Pulse 67   Ht 5\' 3"  (1.6 m)   Wt 113 lb (51.3 kg)   BMI 20.02 kg/m  General: NAD Neck: No JVD, no thyromegaly or thyroid nodule.  Lungs: Clear to auscultation bilaterally with normal respiratory effort. CV: Nondisplaced PMI.  Heart regular S1/S2, no S3/S4, no murmur.  No peripheral edema.  No carotid bruit.  Normal pedal pulses.  Abdomen: Soft, nontender, no hepatosplenomegaly, no distention.  Skin: Intact without lesions or rashes.  Neurologic: Alert and oriented x 3, difficulty with memory.  Psych: Normal affect. Extremities: No clubbing or cyanosis.  HEENT: Normal.   Assessment/Plan: 1. CAD: s/p NSTEMI 6/17 with DES to mid  RCA.  No chest pain.  Echo in 8/17 with preserved EF.  Has dyspnea related to Brilinta use.  - He does not give much history, but daughter seems to think that the dyspnea is stable if he drinks caffeine when he takes Brilinta.  Therefore I will continue.  After 6 months, would consider transition from Brilinta to Plavix.  - Continue ASA 81, statin, Coreg.  2. Hyperlipidemia: Good lipids in 6/17.  3. CKD: Creatinine has been stable.   Followup in 2 months with PA.  Can see Dr Johnsie Cancel going forwards given my transition to CHF clinic.   Daniel Schmitt 04/25/2016

## 2016-06-08 DIAGNOSIS — D649 Anemia, unspecified: Secondary | ICD-10-CM | POA: Diagnosis not present

## 2016-06-08 DIAGNOSIS — Z79899 Other long term (current) drug therapy: Secondary | ICD-10-CM | POA: Diagnosis not present

## 2016-06-08 DIAGNOSIS — N183 Chronic kidney disease, stage 3 (moderate): Secondary | ICD-10-CM | POA: Diagnosis not present

## 2016-06-08 DIAGNOSIS — R413 Other amnesia: Secondary | ICD-10-CM | POA: Diagnosis not present

## 2016-06-08 DIAGNOSIS — Z0000 Encounter for general adult medical examination without abnormal findings: Secondary | ICD-10-CM | POA: Diagnosis not present

## 2016-06-08 DIAGNOSIS — R739 Hyperglycemia, unspecified: Secondary | ICD-10-CM | POA: Diagnosis not present

## 2016-06-08 DIAGNOSIS — I10 Essential (primary) hypertension: Secondary | ICD-10-CM | POA: Diagnosis not present

## 2016-06-22 ENCOUNTER — Encounter: Payer: Self-pay | Admitting: Physician Assistant

## 2016-07-05 ENCOUNTER — Ambulatory Visit (INDEPENDENT_AMBULATORY_CARE_PROVIDER_SITE_OTHER): Payer: Medicare Other | Admitting: Physician Assistant

## 2016-07-05 ENCOUNTER — Encounter: Payer: Self-pay | Admitting: Physician Assistant

## 2016-07-05 VITALS — BP 110/60 | HR 55 | Ht 63.0 in | Wt 115.1 lb

## 2016-07-05 DIAGNOSIS — E785 Hyperlipidemia, unspecified: Secondary | ICD-10-CM | POA: Diagnosis not present

## 2016-07-05 DIAGNOSIS — Z8719 Personal history of other diseases of the digestive system: Secondary | ICD-10-CM

## 2016-07-05 DIAGNOSIS — F0390 Unspecified dementia without behavioral disturbance: Secondary | ICD-10-CM | POA: Diagnosis not present

## 2016-07-05 DIAGNOSIS — I2510 Atherosclerotic heart disease of native coronary artery without angina pectoris: Secondary | ICD-10-CM | POA: Diagnosis not present

## 2016-07-05 MED ORDER — AMLODIPINE BESYLATE 10 MG PO TABS: 10.0000 mg | ORAL_TABLET | Freq: Every day | ORAL | 9 refills | Status: DC

## 2016-07-05 NOTE — Patient Instructions (Signed)
Medication Instructions:  Your physician recommends that you continue on your current medications as directed. Please refer to the Current Medication list given to you today.    Labwork: -None  Testing/Procedures: -None  Follow-Up: Your physician recommends that you keep your scheduled  follow-up appointment with Dr. Nishan.   Any Other Special Instructions Will Be Listed Below (If Applicable).     If you need a refill on your cardiac medications before your next appointment, please call your pharmacy.   

## 2016-07-05 NOTE — Progress Notes (Signed)
Cardiology Office Note    Date:  07/05/2016   ID:  Daniel Schmitt, DOB November 04, 1925, MRN [id number]  PCP:  [name], MD  Cardiologist: was Dr. Aundra Dubin but now Dr. Johnsie Cancel  Chief Complaint  Patient presents with  . Follow-up    History of Present Illness:  Daniel Schmitt is a 82 y.o. male with CKD, dementia, and CAD s/p NSTEMI in 6/17 and had DES to RCA.He has had some dyspnea since PCI. It seems to be related to Brilinta use and is better when he takes caffeine with Brilinta. Echo in 8/17 showed EF 60-65% on echo.Patient also has history of TIA, history of GI bleed, GERD. Dr. Algernon Huxley saw 04/2016 and felt like he could be transitioned from Brilinta to Plavix after 6 months.  Patient is here today accompanied by his daughter. Overall he is doing well. Cared for by his wife. Denies chest pain, dyspnea, palpitations, edema. No bleeding problems.Doesn't do much activity other than walk around his home.      Past Medical History:  Diagnosis Date  . NSTEMI (non-ST elevation myocardial infarction) ([place]) 01/2016   a.3.0 x 38 Synergy DES to mid RCA 01/2016  . Vertigo     Past Surgical History:  Procedure Laterality Date  . CARDIAC CATHETERIZATION N/A 02/17/2016   Procedure: Left Heart Cath and Coronary Angiography;  Surgeon: Jettie Booze, MD;  Location: [facility];  Service: Cardiovascular;  Laterality: N/A;  . CARDIAC CATHETERIZATION N/A 02/17/2016   Procedure: Coronary Stent Intervention;  Surgeon: Jettie Booze, MD;  Location: [place] [facility];  Service: Cardiovascular;  Laterality: N/A;  . HERNIA REPAIR      Current Medications: Outpatient Medications Prior to Visit  Medication Sig Dispense Refill  . acetaminophen (TYLENOL) 500 MG tablet Take 1,000 mg by mouth every 6 (six) hours as needed for pain.    . Ascorbic Acid (VITAMIN C PO) Take 1 tablet by mouth daily.    Marland Kitchen aspirin EC 81 MG EC tablet Take 1 tablet (81 mg total) by mouth daily.    Marland Kitchen atorvastatin  (LIPITOR) 80 MG tablet Take 1 tablet (80 mg total) by mouth daily at 6 PM. 30 tablet 12  . carvedilol (COREG) 12.5 MG tablet Take 1 tablet (12.5 mg total) by mouth 2 (two) times daily with a meal. 60 tablet 12  . donepezil (ARICEPT) 5 MG tablet Take 5 mg by mouth at bedtime.    . nitroGLYCERIN (NITROSTAT) 0.4 MG SL tablet Place 1 tablet (0.4 mg total) under the tongue every 5 (five) minutes x 3 doses as needed for chest pain. 25 tablet 1  . ticagrelor (BRILINTA) 90 MG TABS tablet Take 1 tablet (90 mg total) by mouth 2 (two) times daily. 180 tablet 3   No facility-administered medications prior to visit.      Allergies:   Aspirin   Social History   Social History  . Marital status: Married    Spouse name: N/A  . Number of children: N/A  . Years of education: N/A   Social History Main Topics  . Smoking status: Former Research scientist (life sciences)  . Smokeless tobacco: Never Used     Comment: QUIT SMOKING IN THE LATE 60'S  . Alcohol use No  . Drug use: No  . Sexual activity: Not Asked   Other Topics Concern  . None   Social History Narrative  . None     Family History:  The patient's family history is not on file.  ROS:   Please see the history of present illness.    Review of Systems  Constitution: Positive for weakness and malaise/fatigue.  HENT: Negative.   Cardiovascular: Negative.   Respiratory: Negative.   Endocrine: Positive for cold intolerance.  Hematologic/Lymphatic: Negative.   Musculoskeletal: Negative.   Gastrointestinal: Negative.   Genitourinary: Negative.    All other systems reviewed and are negative.   PHYSICAL EXAM:   VS:  BP 110/60   Pulse (!) 55   Ht 5\' 3"  (1.6 m)   Wt 115 lb 1.9 oz (52.2 kg)   BMI 20.39 kg/m   Physical Exam  GEN: Thin, elderly, in no acute distress  Neck: no JVD, carotid bruits, or masses Cardiac:RRR; no murmurs, rubs, or gallops  Respiratory:  clear to auscultation bilaterally, normal work of breathing GI: soft, nontender, nondistended, +  BS Ext: without cyanosis, clubbing, or edema, Good distal pulses bilaterally MS: no deformity or atrophy  Skin: warm and dry, no rash Psych: euthymic mood, full affect  Wt Readings from Last 3 Encounters:  07/05/16 115 lb 1.9 oz (52.2 kg)  04/23/16 113 lb (51.3 kg)  02/26/16 117 lb 1.9 oz (53.1 kg)      Studies/Labs Reviewed:   EKG:  EKG is not  ordered today.   Recent Labs: 04/09/2016: ALT 16; BUN 17; Creat 1.48; Hemoglobin 12.0; Platelets 223; Potassium 4.1; Sodium 143   Lipid Panel    Component Value Date/Time   CHOL 120 02/17/2016 0708   TRIG 50 02/17/2016 0708   HDL 51 02/17/2016 0708   CHOLHDL 2.4 02/17/2016 0708   VLDL 10 02/17/2016 0708   LDLCALC 59 02/17/2016 0708    Additional studies/ records that were reviewed today include:  Cardiac cath 02/17/16 Conclusion   Mid RCA lesion, 95% stenosed. Post intervention with a 3.0 x 38 Synergy DES postdilated to 3.5 mm, there is a 0% residual stenosis.  Mildly increased LVEDP. Continuing aggressive hydration to prevent contrast-induced nephropathy.   Continue dual antiplatelet therapy for at least a year ideally. If he has bleeding problems, this may need to be shortened.  Continue aggressive secondary prevention.     Left Anterior Descending Mid LAD lesion, 25% stenosed. Left Circumflex First Obtuse Marginal Branch 1st Mrg lesion, 25% stenosed     ASSESSMENT:    1. Coronary artery disease involving native coronary artery of native heart without angina pectoris   2. History of GI bleed   3. Hyperlipidemia, unspecified hyperlipidemia type   4. Dementia without behavioral disturbance, unspecified dementia type      PLAN:  In order of problems listed above:  CAD status post NSTEMI treated with DES to the RCA 01/2016 on Brilinta, aspirin and Lipitor. Dr. Aundra Dubin discussed switching Brilinta to Plavix after 6 months. He will follow-up with Dr. Johnsie Cancel who can make this transition. Doing well without angina.  History  of GI bleed. No problems on Brilinta and aspirin  Hyperlipidemia continue Lipitor  Dementia patient is taking care of by his wife and does not get out much.    Medication Adjustments/Labs and Tests Ordered: Current medicines are reviewed at length with the patient today.  Concerns regarding medicines are outlined above.  Medication changes, Labs and Tests ordered today are listed in the Patient Instructions below. Patient Instructions  Medication Instructions:  Your physician recommends that you continue on your current medications as directed. Please refer to the Current Medication list given to you today.   Labwork: -None  Testing/Procedures: -None  Follow-Up: Your  physician recommends that you keep your scheduled  follow-up appointment with Dr. Johnsie Cancel.   Any Other Special Instructions Will Be Listed Below (If Applicable).     If you need a refill on your cardiac medications before your next appointment, please call your pharmacy.      Signed, Ermalinda Barrios, PA-C  07/05/2016 2:25 PM    [facility] [address], [place], [place]  [address] Phone: [phone]; Fax: [phone]

## 2016-10-26 NOTE — Progress Notes (Signed)
Cardiology Office Note    Date:  11/08/2016   ID:  [name], DOB [birth year], MRN [id number]  PCP:  [name], MD  Cardiologist: was Dr. Aundra Dubin but now Dr. Johnsie Cancel  F/U CAD  History of Present Illness:  Daniel Schmitt is a 83 y.o. male with CKD, dementia, and CAD s/p NSTEMI in 6/17 and had DES to RCA. New to me previously followed by Dr Aundra Dubin and last seen by PA in November He has had some dyspnea since PCI. It seems to be related to Brilinta use and is better when he takes caffeine with Brilinta. Echo in 8/17 showed EF 60-65% on echo.Patient also has history of TIA, history of GI bleed, GERD. Dr. Marigene Ehlers saw 04/2016 and felt like he could be transitioned from Brilinta to Plavix after 6 months.  Reviewed cath films personally:  02/17/16 Mid RCA 95% nice result with 3 mm Synergy DES post dilated to 3.5 mm no residual stenosis  No significant left sided disease  Reviewed f/u echo 04/09/16 EF normal  60-65% with no RWMA;s trivial MR    Patient is here today accompanied by his daughter. Overall he is doing well. Cared for by his wife. Denies chest pain, dyspnea, palpitations, edema. No bleeding problems.Doesn't do much activity other than walk around his home.   Dementia is profound and he has little insight into any medical issues  Past Medical History:  Diagnosis Date  . NSTEMI (non-ST elevation myocardial infarction) (Daniel Schmitt) 01/2016   a.3.0 x 38 Synergy DES to mid RCA 01/2016  . Vertigo     Past Surgical History:  Procedure Laterality Date  . CARDIAC CATHETERIZATION N/A 02/17/2016   Procedure: Left Heart Cath and Coronary Angiography;  Surgeon: Jettie Booze, MD;  Location: [place] [facility];  Service: Cardiovascular;  Laterality: N/A;  . CARDIAC CATHETERIZATION N/A 02/17/2016   Procedure: Coronary Stent Intervention;  Surgeon: Jettie Booze, MD;  Location: [place] [facility];  Service: Cardiovascular;  Laterality: N/A;  . HERNIA REPAIR      Current  Medications: Outpatient Medications Prior to Visit  Medication Sig Dispense Refill  . acetaminophen (TYLENOL) 500 MG tablet Take 1,000 mg by mouth every 6 (six) hours as needed for pain.    Marland Kitchen amLODipine (NORVASC) 10 MG tablet Take 1 tablet (10 mg total) by mouth daily. 30 tablet 9  . Ascorbic Acid (VITAMIN C PO) Take 1 tablet by mouth daily.    Marland Kitchen aspirin EC 81 MG EC tablet Take 1 tablet (81 mg total) by mouth daily.    Marland Kitchen atorvastatin (LIPITOR) 80 MG tablet Take 1 tablet (80 mg total) by mouth daily at 6 PM. 30 tablet 12  . carvedilol (COREG) 12.5 MG tablet Take 1 tablet (12.5 mg total) by mouth 2 (two) times daily with a meal. 60 tablet 12  . donepezil (ARICEPT) 5 MG tablet Take 5 mg by mouth at bedtime.    . nitroGLYCERIN (NITROSTAT) 0.4 MG SL tablet Place 1 tablet (0.4 mg total) under the tongue every 5 (five) minutes x 3 doses as needed for chest pain. 25 tablet 1  . pantoprazole (PROTONIX) 40 MG tablet Take 40 mg by mouth daily.    . ticagrelor (BRILINTA) 90 MG TABS tablet Take 1 tablet (90 mg total) by mouth 2 (two) times daily. 180 tablet 3   No facility-administered medications prior to visit.      Allergies:   Aspirin   Social History   Social History  .  Marital status: Married    Spouse name: N/A  . Number of children: N/A  . Years of education: N/A   Social History Main Topics  . Smoking status: Former Research scientist (life sciences)  . Smokeless tobacco: Never Used     Comment: QUIT SMOKING IN THE LATE 60'S  . Alcohol use No  . Drug use: No  . Sexual activity: Not Asked   Other Topics Concern  . None   Social History Narrative  . None     Family History:  The patient's family history is not on file.   ROS:   Please see the history of present illness.    Review of Systems  Constitution: Positive for weakness and malaise/fatigue.  HENT: Negative.   Cardiovascular: Negative.   Respiratory: Negative.   Endocrine: Positive for cold intolerance.  Hematologic/Lymphatic: Negative.    Musculoskeletal: Negative.   Gastrointestinal: Negative.   Genitourinary: Negative.    All other systems reviewed and are negative.   PHYSICAL EXAM:   VS:  BP (!) 126/56   Pulse (!) 59   Ht 5\' 3"  (1.6 m)   Wt 117 lb 6.4 oz (53.3 kg)   BMI 20.80 kg/m   Physical Exam  GEN: Thin, elderly, in no acute distress  Neck: no JVD, carotid bruits, or masses Cardiac:RRR; no murmurs, rubs, or gallops  Respiratory:  clear to auscultation bilaterally, normal work of breathing GI: soft, nontender, nondistended, + BS Ext: without cyanosis, clubbing, or edema, Good distal pulses bilaterally MS: no deformity or atrophy  Skin: warm and dry, no rash Psych: euthymic mood, full affect  No memory and dementia  Wt Readings from Last 3 Encounters:  11/08/16 117 lb 6.4 oz (53.3 kg)  07/05/16 115 lb 1.9 oz (52.2 kg)  04/23/16 113 lb (51.3 kg)      Studies/Labs Reviewed:   EKG:  04/23/16 SR inferolateral T wave inversions    Recent Labs: 04/09/2016: ALT 16; BUN 17; Creat 1.48; Hemoglobin 12.0; Platelets 223; Potassium 4.1; Sodium 143   Lipid Panel    Component Value Date/Time   CHOL 120 02/17/2016 0708   TRIG 50 02/17/2016 0708   HDL 51 02/17/2016 0708   CHOLHDL 2.4 02/17/2016 0708   VLDL 10 02/17/2016 0708   LDLCALC 59 02/17/2016 0708    Additional studies/ records that were reviewed today include:  Cardiac cath 02/17/16 Conclusion   Mid RCA lesion, 95% stenosed. Post intervention with a 3.0 x 38 Synergy DES postdilated to 3.5 mm, there is a 0% residual stenosis.  Mildly increased LVEDP. Continuing aggressive hydration to prevent contrast-induced nephropathy.   Continue dual antiplatelet therapy for at least a year ideally. If he has bleeding problems, this may need to be shortened.  Continue aggressive secondary prevention.     Left Anterior Descending Mid LAD lesion, 25% stenosed. Left Circumflex First Obtuse Marginal Branch 1st Mrg lesion, 25% stenosed     ASSESSMENT:    CAD  Post DES to RCA June 2017   PLAN:  In order of problems listed above:  CAD status post NSTEMI treated with DES to the RCA 01/2016 on Brilinta, aspirin and Lipitor. Change to Plavix.  Doing well without angina.  History of GI bleed. No problems on DAT   Hyperlipidemia continue Lipitor  Dementia patient is taking care of by his wife and does not get out much. No improvement with Aricept   [name]

## 2016-11-08 ENCOUNTER — Ambulatory Visit (INDEPENDENT_AMBULATORY_CARE_PROVIDER_SITE_OTHER): Payer: Medicare Other | Admitting: Cardiovascular Disease

## 2016-11-08 ENCOUNTER — Encounter: Payer: Self-pay | Admitting: Cardiovascular Disease

## 2016-11-08 VITALS — BP 126/56 | HR 59 | Ht 63.0 in | Wt 117.4 lb

## 2016-11-08 DIAGNOSIS — I2510 Atherosclerotic heart disease of native coronary artery without angina pectoris: Secondary | ICD-10-CM

## 2016-11-08 MED ORDER — CLOPIDOGREL BISULFATE 75 MG PO TABS: 75.0000 mg | ORAL_TABLET | Freq: Every day | ORAL | 3 refills | Status: DC

## 2016-11-08 NOTE — Patient Instructions (Addendum)
Medication Instructions:  Your physician has recommended you make the following change in your medication:  1-STOP Brilinta 2-START Plavix 75 mg by mouth daily  Labwork: NONE  Testing/Procedures: NONE  Follow-Up: Your physician wants you to follow-up in: 6 months with Dr. Johnsie Cancel. You will receive a reminder letter in the mail two months in advance. If you don't receive a letter, please call our office to schedule the follow-up appointment.   If you need a refill on your cardiac medications before your next appointment, please call your pharmacy.

## 2016-12-08 DIAGNOSIS — I10 Essential (primary) hypertension: Secondary | ICD-10-CM | POA: Diagnosis not present

## 2016-12-08 DIAGNOSIS — R739 Hyperglycemia, unspecified: Secondary | ICD-10-CM | POA: Diagnosis not present

## 2016-12-08 DIAGNOSIS — R413 Other amnesia: Secondary | ICD-10-CM | POA: Diagnosis not present

## 2016-12-15 DIAGNOSIS — I10 Essential (primary) hypertension: Secondary | ICD-10-CM | POA: Diagnosis not present

## 2016-12-15 DIAGNOSIS — E039 Hypothyroidism, unspecified: Secondary | ICD-10-CM | POA: Diagnosis not present

## 2016-12-15 DIAGNOSIS — Z0000 Encounter for general adult medical examination without abnormal findings: Secondary | ICD-10-CM | POA: Diagnosis not present

## 2016-12-15 DIAGNOSIS — R739 Hyperglycemia, unspecified: Secondary | ICD-10-CM | POA: Diagnosis not present

## 2016-12-28 ENCOUNTER — Other Ambulatory Visit: Payer: Self-pay | Admitting: *Deleted

## 2016-12-28 MED ORDER — ATORVASTATIN CALCIUM 80 MG PO TABS: 80.0000 mg | ORAL_TABLET | Freq: Every day | ORAL | 12 refills | Status: DC

## 2016-12-31 ENCOUNTER — Telehealth: Payer: Self-pay | Admitting: Cardiovascular Disease

## 2016-12-31 NOTE — Telephone Encounter (Signed)
Informed pharmacist at [facility] that patient was to discontinue brilinta and start plavix. Pharmacist verbalized understanding.

## 2016-12-31 NOTE — Telephone Encounter (Signed)
Waunita Schooner, Pharmacist at [facility] is calling to verify if patient is on brilinta and plavix medications. Please call at [phone] to verify,thanks.

## 2017-01-01 ENCOUNTER — Other Ambulatory Visit: Payer: Self-pay | Admitting: Physician Assistant

## 2017-01-18 ENCOUNTER — Other Ambulatory Visit: Payer: Self-pay | Admitting: *Deleted

## 2017-01-18 MED ORDER — CARVEDILOL 12.5 MG PO TABS: 12.5000 mg | ORAL_TABLET | Freq: Two times a day (BID) | ORAL | 2 refills | Status: DC

## 2017-03-09 DIAGNOSIS — E039 Hypothyroidism, unspecified: Secondary | ICD-10-CM | POA: Diagnosis not present

## 2017-03-16 DIAGNOSIS — L989 Disorder of the skin and subcutaneous tissue, unspecified: Secondary | ICD-10-CM | POA: Diagnosis not present

## 2017-03-16 DIAGNOSIS — E039 Hypothyroidism, unspecified: Secondary | ICD-10-CM | POA: Diagnosis not present

## 2017-03-16 DIAGNOSIS — I10 Essential (primary) hypertension: Secondary | ICD-10-CM | POA: Diagnosis not present

## 2017-06-08 DIAGNOSIS — E039 Hypothyroidism, unspecified: Secondary | ICD-10-CM | POA: Diagnosis not present

## 2017-06-08 DIAGNOSIS — I10 Essential (primary) hypertension: Secondary | ICD-10-CM | POA: Diagnosis not present

## 2017-06-15 DIAGNOSIS — I10 Essential (primary) hypertension: Secondary | ICD-10-CM | POA: Diagnosis not present

## 2017-06-15 DIAGNOSIS — E7800 Pure hypercholesterolemia, unspecified: Secondary | ICD-10-CM | POA: Diagnosis not present

## 2017-06-15 DIAGNOSIS — Z23 Encounter for immunization: Secondary | ICD-10-CM | POA: Diagnosis not present

## 2017-06-15 DIAGNOSIS — E039 Hypothyroidism, unspecified: Secondary | ICD-10-CM | POA: Diagnosis not present

## 2017-06-22 ENCOUNTER — Ambulatory Visit: Payer: Medicare Other | Admitting: Cardiovascular Disease

## 2017-07-13 DIAGNOSIS — L989 Disorder of the skin and subcutaneous tissue, unspecified: Secondary | ICD-10-CM | POA: Diagnosis not present

## 2017-08-30 NOTE — Progress Notes (Signed)
Cardiology Office Note    Date:  08/30/2017   ID:  Daniel Schmitt, DOB 1926/06/18, MRN [id number]  PCP:  [name], MD  Cardiologist: was Dr. Aundra Dubin but now Dr. Johnsie Cancel  F/U CAD  History of Present Illness:  Daniel Schmitt is a 84 y.o. male with CKD, dementia, and CAD s/p NSTEMI in 6/17 and had DES to RCA. Previously followed by Dr Aundra Dubin and last seen by PA in November Dyspnea in past with Brillnta  Echo in 8/17 showed EF 60-65%  Patient also has history of TIA, history of GI bleed, GERD. Dr.   Reviewed cath films personally:  02/17/16 Mid RCA 95% nice result with 3 mm Synergy DES post dilated to 3.5 mm no residual stenosis  No significant left sided disease  Reviewed f/u echo 04/09/16 EF normal  60-65% with no RWMA;s trivial MR   Denies chest pain, dyspnea, palpitations, edema. No bleeding problems.Doesn't do much activity other than walk around his home.  Dementia is severe   Daughter and wife take good care of him. 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 NSTEMI (non-ST elevation myocardial infarction) ([place]) 01/2016   a.3.0 x 38 Synergy DES to mid RCA 01/2016  . Vertigo     Past Surgical History:  Procedure Laterality Date  . CARDIAC CATHETERIZATION N/A 02/17/2016   Procedure: Left Heart Cath and Coronary Angiography;  Surgeon: Jettie Booze, MD;  Location: [facility];  Service: Cardiovascular;  Laterality: N/A;  . CARDIAC CATHETERIZATION N/A 02/17/2016   Procedure: Coronary Stent Intervention;  Surgeon: Jettie Booze, MD;  Location: [facility];  Service: Cardiovascular;  Laterality: N/A;  . HERNIA REPAIR      Current Medications: Outpatient Medications Prior to Visit  Medication Sig Dispense Refill  . acetaminophen (TYLENOL) 500 MG tablet Take 1,000 mg by mouth every 6 (six) hours as needed for pain.    Marland Kitchen amLODipine (NORVASC) 10 MG tablet Take 1 tablet  (10 mg total) by mouth daily. 30 tablet 9  . Ascorbic Acid (VITAMIN C PO) Take 1 tablet by mouth daily.    Marland Kitchen aspirin EC 81 MG EC tablet Take 1 tablet (81 mg total) by mouth daily.    Marland Kitchen atorvastatin (LIPITOR) 80 MG tablet Take 1 tablet (80 mg total) by mouth daily at 6 PM. 30 tablet 12  . carvedilol (COREG) 12.5 MG tablet Take 1 tablet (12.5 mg total) by mouth 2 (two) times daily with a meal. 180 tablet 2  . clopidogrel (PLAVIX) 75 MG tablet Take 1 tablet (75 mg total) by mouth daily. 90 tablet 3  . donepezil (ARICEPT) 5 MG tablet Take 5 mg by mouth at bedtime.    . nitroGLYCERIN (NITROSTAT) 0.4 MG SL tablet Place 1 tablet (0.4 mg total) under the tongue every 5 (five) minutes x 3 doses as needed for chest pain. 25 tablet 1  . pantoprazole (PROTONIX) 40 MG tablet TAKE 1 TABLET BY MOUTH EVERY DAY 30 tablet 9   No facility-administered medications prior to visit.      Allergies:   Aspirin   Social History   Socioeconomic History  . Marital status: Married    Spouse name: Not on file  . Number of children: Not on file  . Years of education: Not on file  . Highest education level: Not on file  Social  Needs  . Financial resource strain: Not on file  . Food insecurity - worry: Not on file  . Food insecurity - inability: Not on file  . Transportation needs - medical: Not on file  . Transportation needs - non-medical: Not on file  Occupational History  . Not on file  Tobacco Use  . Smoking status: Former Research scientist (life sciences)  . Smokeless tobacco: Never Used  . Tobacco comment: QUIT SMOKING IN THE LATE 60'S  Substance and Sexual Activity  . Alcohol use: No  . Drug use: No  . Sexual activity: Not on file  Other Topics Concern  . Not on file  Social History Narrative  . Not on file     Family History:  The patient's family history is not on file.   ROS:   Please see the history of present illness.    Review of Systems  Constitution: Positive for weakness and malaise/fatigue.  HENT: Negative.    Cardiovascular: Negative.   Respiratory: Negative.   Endocrine: Positive for cold intolerance.  Hematologic/Lymphatic: Negative.   Musculoskeletal: Negative.   Gastrointestinal: Negative.   Genitourinary: Negative.    All other systems reviewed and are negative.   PHYSICAL EXAM:   VS:  There were no vitals taken for this visit.  Physical Exam  Affect appropriate Healthy:  appears stated age 58: normal Neck supple with no adenopathy JVP normal no bruits no thyromegaly Lungs clear with no wheezing and good diaphragmatic motion Heart:  S1/S2 no murmur, no rub, gallop or click PMI normal Abdomen: benighn, BS positve, no tenderness, no AAA no bruit.  No HSM or HJR Distal pulses intact with no bruits No edema Neuro non-focal Skin warm and dry No muscular weakness   Wt Readings from Last 3 Encounters:  11/08/16 117 lb 6.4 oz (53.3 kg)  07/05/16 115 lb 1.9 oz (52.2 kg)  04/23/16 113 lb (51.3 kg)      Studies/Labs Reviewed:   EKG:  04/23/16 SR inferolateral T wave inversions 09/02/17 SR rate 69 old IMI no acute changes    Recent Labs: No results found for requested labs within last 8760 hours.   Lipid Panel    Component Value Date/Time   CHOL 120 02/17/2016 0708   TRIG 50 02/17/2016 0708   HDL 51 02/17/2016 0708   CHOLHDL 2.4 02/17/2016 0708   VLDL 10 02/17/2016 0708   LDLCALC 59 02/17/2016 0708    Additional studies/ records that were reviewed today include:  Cardiac cath 02/17/16 Conclusion   Mid RCA lesion, 95% stenosed. Post intervention with a 3.0 x 38 Synergy DES postdilated to 3.5 mm, there is a 0% residual stenosis.  Mildly increased LVEDP. Continuing aggressive hydration to prevent contrast-induced nephropathy.   Continue dual antiplatelet therapy for at least a year ideally. If he has bleeding problems, this may need to be shortened.  Continue aggressive secondary prevention.     Left Anterior Descending Mid LAD lesion, 25% stenosed. Left  Circumflex First Obtuse Marginal Branch 1st Mrg lesion, 25% stenosed   PLAN:    CAD status post NSTEMI treated with DES to the RCA 01/2016 on Plavix  aspirin and Lipitor.  Doing well without angina.  History of GI bleed. No problems on DAT   Hyperlipidemia continue Lipitor labs with primary noted normal LFTls in Epic August 2017  Dementia wife and daughter primary care givers   No improvement with Aricept   [name]

## 2017-09-02 ENCOUNTER — Ambulatory Visit (INDEPENDENT_AMBULATORY_CARE_PROVIDER_SITE_OTHER): Payer: Medicare Other | Admitting: Cardiovascular Disease

## 2017-09-02 ENCOUNTER — Encounter: Payer: Self-pay | Admitting: Cardiovascular Disease

## 2017-09-02 VITALS — BP 124/66 | HR 65 | Ht 62.0 in | Wt 129.6 lb

## 2017-09-02 DIAGNOSIS — E785 Hyperlipidemia, unspecified: Secondary | ICD-10-CM

## 2017-09-02 DIAGNOSIS — I2510 Atherosclerotic heart disease of native coronary artery without angina pectoris: Secondary | ICD-10-CM | POA: Diagnosis not present

## 2017-09-02 NOTE — Patient Instructions (Addendum)

## 2017-09-14 DIAGNOSIS — E039 Hypothyroidism, unspecified: Secondary | ICD-10-CM | POA: Diagnosis not present

## 2017-09-14 DIAGNOSIS — I10 Essential (primary) hypertension: Secondary | ICD-10-CM | POA: Diagnosis not present

## 2017-09-21 DIAGNOSIS — I10 Essential (primary) hypertension: Secondary | ICD-10-CM | POA: Diagnosis not present

## 2017-09-21 DIAGNOSIS — R739 Hyperglycemia, unspecified: Secondary | ICD-10-CM | POA: Diagnosis not present

## 2017-09-21 DIAGNOSIS — E7800 Pure hypercholesterolemia, unspecified: Secondary | ICD-10-CM | POA: Diagnosis not present

## 2017-09-21 DIAGNOSIS — L989 Disorder of the skin and subcutaneous tissue, unspecified: Secondary | ICD-10-CM | POA: Diagnosis not present

## 2017-09-21 DIAGNOSIS — E039 Hypothyroidism, unspecified: Secondary | ICD-10-CM | POA: Diagnosis not present

## 2017-09-21 DIAGNOSIS — Z0000 Encounter for general adult medical examination without abnormal findings: Secondary | ICD-10-CM | POA: Diagnosis not present

## 2017-09-28 DIAGNOSIS — C4441 Basal cell carcinoma of skin of scalp and neck: Secondary | ICD-10-CM | POA: Diagnosis not present

## 2017-09-28 DIAGNOSIS — L821 Other seborrheic keratosis: Secondary | ICD-10-CM | POA: Diagnosis not present

## 2017-09-28 DIAGNOSIS — D485 Neoplasm of uncertain behavior of skin: Secondary | ICD-10-CM | POA: Diagnosis not present

## 2017-09-28 DIAGNOSIS — C44319 Basal cell carcinoma of skin of other parts of face: Secondary | ICD-10-CM | POA: Diagnosis not present

## 2017-10-05 ENCOUNTER — Encounter: Payer: Self-pay | Admitting: Hematology & Oncology

## 2017-10-06 ENCOUNTER — Telehealth: Payer: Self-pay | Admitting: Hematology & Oncology

## 2017-10-06 NOTE — Telephone Encounter (Signed)
Unable to reach patient to inform pf New Patient appt 10/11/17. Telephone just rings then ask for a remote access code

## 2017-10-11 ENCOUNTER — Other Ambulatory Visit: Payer: Self-pay | Admitting: Cardiovascular Disease

## 2017-10-11 ENCOUNTER — Encounter: Payer: Self-pay | Admitting: Hematology & Oncology

## 2017-10-11 ENCOUNTER — Inpatient Hospital Stay: Payer: Medicare Other | Attending: Hematology & Oncology | Admitting: Hematology & Oncology

## 2017-10-11 ENCOUNTER — Other Ambulatory Visit: Payer: Self-pay

## 2017-10-11 ENCOUNTER — Inpatient Hospital Stay: Payer: Medicare Other

## 2017-10-11 VITALS — BP 141/62 | HR 63 | Temp 97.4°F | Resp 18 | Ht 62.0 in | Wt 130.0 lb

## 2017-10-11 DIAGNOSIS — G939 Disorder of brain, unspecified: Secondary | ICD-10-CM | POA: Insufficient documentation

## 2017-10-11 DIAGNOSIS — C44310 Basal cell carcinoma of skin of unspecified parts of face: Secondary | ICD-10-CM

## 2017-10-11 DIAGNOSIS — Z87891 Personal history of nicotine dependence: Secondary | ICD-10-CM | POA: Insufficient documentation

## 2017-10-11 HISTORY — DX: Basal cell carcinoma of skin of unspecified parts of face: C44.310

## 2017-10-11 LAB — CMP (CANCER CENTER ONLY)
ALK PHOS: 84 U/L (ref 26–84)
ALT: 15 U/L (ref 10–47)
ANION GAP: 7 (ref 5–15)
AST: 29 U/L (ref 11–38)
Albumin: 3.8 g/dL (ref 3.5–5.0)
BUN: 19 mg/dL (ref 7–22)
CHLORIDE: 112 mmol/L — AB (ref 98–108)
CO2: 26 mmol/L (ref 18–33)
Calcium: 9.5 mg/dL (ref 8.0–10.3)
Creatinine: 1.9 mg/dL — ABNORMAL HIGH (ref 0.60–1.20)
Glucose, Bld: 117 mg/dL (ref 73–118)
Potassium: 4.4 mmol/L (ref 3.3–4.7)
Sodium: 145 mmol/L (ref 128–145)
TOTAL PROTEIN: 7.8 g/dL (ref 6.4–8.1)
Total Bilirubin: 0.9 mg/dL (ref 0.2–1.6)

## 2017-10-11 LAB — CBC WITH DIFFERENTIAL (CANCER CENTER ONLY)
BASOS ABS: 0 10*3/uL (ref 0.0–0.1)
Basophils Relative: 0 %
Eosinophils Absolute: 0.4 10*3/uL (ref 0.0–0.5)
Eosinophils Relative: 4 %
HEMATOCRIT: 37.7 % — AB (ref 38.7–49.9)
HEMOGLOBIN: 12.6 g/dL — AB (ref 13.0–17.1)
Lymphocytes Relative: 26 %
Lymphs Abs: 3.1 10*3/uL (ref 0.9–3.3)
MCH: 32.6 pg (ref 28.0–33.4)
MCHC: 33.4 g/dL (ref 32.0–35.9)
MCV: 97.7 fL (ref 82.0–98.0)
Monocytes Absolute: 1.2 10*3/uL — ABNORMAL HIGH (ref 0.1–0.9)
Monocytes Relative: 10 %
NEUTROS ABS: 6.9 10*3/uL — AB (ref 1.5–6.5)
NEUTROS PCT: 60 %
PLATELETS: 243 10*3/uL (ref 145–400)
RBC: 3.86 MIL/uL — AB (ref 4.20–5.70)
RDW: 13.2 % (ref 11.1–15.7)
WBC: 11.6 10*3/uL — AB (ref 4.0–10.0)

## 2017-10-11 LAB — LACTATE DEHYDROGENASE: LDH: 180 U/L (ref 98–192)

## 2017-10-11 NOTE — Progress Notes (Signed)
Referral MD  Reason for Referral: Locally advanced basal cell carcinoma of the face.  Chief Complaint  Patient presents with  . New Patient (Initial Visit)  : I have growths on my face.  HPI: Daniel Schmitt is a very nice 84 year old white male.  He is a former Freight forwarder.  He was in the [employer].  He is from [place].  Shockingly enough, he and his family went to the same church as my wife and her family.  He knows my in-laws.  His daughter, who came with him, was good friends with my wife.  He is followed at [facility].  He sees Dr. Sarajane Jews.  Dr. Sarajane Jews has been following these basal cell carcinomas on his skin.  He had a biopsy done on September 28, 2017.  The pathology report ([id number]) showed basal cell carcinoma.  He has a large mass in the right temporal region.  He has a smaller mass under his left eye.  There is a third lesion on his forehead.  He was not felt to be a surgical candidate.  Dr. Sarajane Jews kindly referred him to the [facility] for evaluation.  He does have some element of dementia.  Thankfully, his daughter helped out quite a bit.  He says that these lesions do not bleed.  She says that they bleed quite a bit.  He says that there is no pain.  He has not lost weight.  He denies any headache.  He has had no fever.  He has had no nausea or vomiting.  He used to smoke.  He stopped about 60 years ago.  He does not drink.  There is no issues with cough or shortness of breath.  He does not eat meat.  He has an aversion to needles.  He says he would not take any type of intravenous therapy.  I would have to say that his overall performance status is ECOG 2-3.    Past Medical History:  Diagnosis Date  . Basal cell carcinoma (BCC) of face 10/11/2017  . NSTEMI (non-ST elevation myocardial infarction) ([place]) 01/2016   a.3.0 x 38 Synergy DES to mid RCA 01/2016  . Vertigo   :  Past Surgical History:  Procedure Laterality Date  .  CARDIAC CATHETERIZATION N/A 02/17/2016   Procedure: Left Heart Cath and Coronary Angiography;  Surgeon: Jettie Booze, MD;  Location: [facility];  Service: Cardiovascular;  Laterality: N/A;  . CARDIAC CATHETERIZATION N/A 02/17/2016   Procedure: Coronary Stent Intervention;  Surgeon: Jettie Booze, MD;  Location: [facility];  Service: Cardiovascular;  Laterality: N/A;  . HERNIA REPAIR    :   Current Outpatient Medications:  .  cephALEXin (KEFLEX) 250 MG capsule, Take 250 mg by mouth 4 (four) times daily., Disp: , Rfl:  .  acetaminophen (TYLENOL) 500 MG tablet, Take 1,000 mg by mouth every 6 (six) hours as needed for pain., Disp: , Rfl:  .  amLODipine (NORVASC) 10 MG tablet, Take 1 tablet (10 mg total) by mouth daily., Disp: 30 tablet, Rfl: 9 .  aspirin EC 81 MG EC tablet, Take 1 tablet (81 mg total) by mouth daily., Disp: , Rfl:  .  atorvastatin (LIPITOR) 80 MG tablet, Take 1 tablet (80 mg total) by mouth daily at 6 PM., Disp: 30 tablet, Rfl: 12 .  carvedilol (COREG) 12.5 MG tablet, TAKE 1 TABLET (12.5 MG TOTAL) BY MOUTH 2 (TWO) TIMES DAILY WITH A MEAL., Disp: 180 tablet, Rfl: 2 .  clopidogrel (PLAVIX) 75 MG tablet, Take 1 tablet (75 mg total) by mouth daily., Disp: 90 tablet, Rfl: 3 .  donepezil (ARICEPT) 5 MG tablet, Take 5 mg by mouth at bedtime., Disp: , Rfl:  .  levothyroxine (SYNTHROID, LEVOTHROID) 25 MCG tablet, Take 25 mcg by mouth daily before breakfast., Disp: , Rfl:  .  nitroGLYCERIN (NITROSTAT) 0.4 MG SL tablet, Place 1 tablet (0.4 mg total) under the tongue every 5 (five) minutes x 3 doses as needed for chest pain., Disp: 25 tablet, Rfl: 1:  :  Allergies  Allergen Reactions  . Aspirin Other (See Comments)    Stomach bleeds   :  History reviewed. No pertinent family history.:  Social History   Socioeconomic History  . Marital status: Married    Spouse name: Not on file  . Number of children: Not on file  . Years of education: Not on file  .  Highest education level: Not on file  Social Needs  . Financial resource strain: Not on file  . Food insecurity - worry: Not on file  . Food insecurity - inability: Not on file  . Transportation needs - medical: Not on file  . Transportation needs - non-medical: Not on file  Occupational History  . Not on file  Tobacco Use  . Smoking status: Former Research scientist (life sciences)  . Smokeless tobacco: Never Used  . Tobacco comment: QUIT SMOKING IN THE LATE 60'S  Substance and Sexual Activity  . Alcohol use: No  . Drug use: No  . Sexual activity: Not on file  Other Topics Concern  . Not on file  Social History Narrative  . Not on file  :  Review of Systems  Constitutional: Negative.   HENT: Negative.   Eyes: Negative.   Cardiovascular: Negative.   Gastrointestinal: Negative.   Genitourinary: Negative.   Musculoskeletal: Negative.   Skin: Negative.   Neurological: Negative.   Endo/Heme/Allergies: Negative.   Psychiatric/Behavioral: Negative.      Exam: Elderly white male.  He is quite small.  His vital signs show a temperature of 97.4.  Pulse 63.  Blood pressure 141/62.  Weight is 130 pounds.  Head neck exam shows a large fungating lesion in the right temple.  This probably measures about 6 x 6 centimeters.  It is friable.  There might be a little bit of blood.  Under the left eye, on the infraorbital lid, is another tumor that probably measures about 2 x 3 cm.  On the crown of the forehead, is a third tumor that probably measures about a centimeter.  There is no adenopathy.  His extraocular muscles are intact.  He has no intraoral lesions.  Lungs are clear.  Cardiac exam regular rate and rhythm.  Abdomen is soft.  He has decent bowel sounds.  There is no fluid wave.  There is no palpable liver or spleen tip.  Back exam shows no tenderness over the spine, ribs or hips.  Extremities shows no clubbing, cyanosis or edema.  Neurological exam shows the dementia.  He is very pleasant. @IPVITALS @   Recent  Labs    10/11/17 1345  WBC 11.6*  HCT 37.7*  PLT 243   Recent Labs    10/11/17 1344  NA 145  K 4.4  CL 112*  CO2 26  GLUCOSE 117  BUN 19  CREATININE 1.90*  CALCIUM 9.5    Blood smear review: None  Pathology: See above    Assessment and Plan: Daniel Schmitt is a 84 year old white  male.  He has locally advanced basal cell carcinoma.  He has a large right temporal lesion.  This is the one that is troubling me.  I really think that radiation therapy should be able to help with this.  If he is having some bleeding, I think that radiation should be able to help and scarred down this tumor.  I spoke with Dr. Sondra Come of radiation oncology.  He will see Daniel Schmitt and try to help him out.  I would reserve systemic therapy for him if radiation cannot be used.  I spent about an hour with he and his daughter.  Over 50% of the time was spent face-to-face talking with them.  I went over my recommendations.  I reviewed the excellent notes sent by Dr. Sarajane Jews.  I want to try to be the least invasive if possible.  I would like to see him back in about 3 or 4 weeks.  I would think that by then, if radiation therapy is indicated, that he will be well into treatment.

## 2017-10-12 ENCOUNTER — Encounter: Payer: Self-pay | Admitting: Radiation Oncology

## 2017-10-12 NOTE — Progress Notes (Signed)
Histology and Location of Primary Skin Cancer: Locally advanced basal cell carcinoma of the face with a large mass in the right temporal region, a smaller mass under his left eye and a third lesion on his forehead.  Daniel Schmitt presented with the following signs/symptoms,  months URK:YHCWC cell cancer on his face near left eye and top of his head have been growing for  over 17 years.  Past/Anticipated interventions by patient's surgeon/dermatologist for current problematic lesion, if any:   Biopsy revealed:  09/28/17  Past skin cancers, if any:  1) Location/Histology/Intervention: None known  2) Location/Histology/Intervention:   3) Location/Histology/Intervention:   History of Blistering sunburns, if any:No has not been in the sun since his 20;s worked in the sun for 32 years worked for the [employer].  SAFETY ISSUES:  Prior radiation? :No  Pacemaker/ICD? : No  Possible current pregnancy? no  Is the patient on methotrexate? :No  Wt Readings from Last 3 Encounters:  10/19/17 131 lb 11.2 oz (59.7 kg)  10/11/17 130 lb (59 kg)  09/02/17 129 lb 9.6 oz (58.8 kg)  BP (!) 126/59 (BP Location: Left Arm, Patient Position: Sitting, Cuff Size: Normal)   Pulse 94   Temp (!) 97.4 F (36.3 C) (Oral)   Resp 18   Ht 5\' 2"  (1.575 m)   Wt 131 lb 11.2 oz (59.7 kg)   SpO2 97%   BMI 24.09 kg/m  Current Complaints / other details:

## 2017-10-19 ENCOUNTER — Other Ambulatory Visit: Payer: Self-pay

## 2017-10-19 ENCOUNTER — Encounter: Payer: Self-pay | Admitting: Radiation Oncology

## 2017-10-19 ENCOUNTER — Ambulatory Visit
Admission: RE | Admit: 2017-10-19 | Discharge: 2017-10-19 | Disposition: A | Discharge status: Home / Self Care | Payer: Medicare Other | Source: Ambulatory Visit | Attending: Radiation Oncology | Admitting: Radiation Oncology

## 2017-10-19 VITALS — BP 126/59 | HR 94 | Temp 97.4°F | Resp 18 | Ht 62.0 in | Wt 131.7 lb

## 2017-10-19 DIAGNOSIS — C44310 Basal cell carcinoma of skin of unspecified parts of face: Secondary | ICD-10-CM | POA: Diagnosis not present

## 2017-10-19 NOTE — Progress Notes (Signed)
Radiation Oncology         [phone] ________________________________  Initial Outpatient Consultation  Name: WAH SABIC MRN: [id number]  Date: 10/19/2017  DOB: [birth year]  CC:Kim, Jeneen Rinks, MD  Volanda Napoleon, MD   REFERRING PHYSICIAN: Volanda Napoleon, MD  DIAGNOSIS: The encounter diagnosis was Basal cell carcinoma (BCC) of face.  HISTORY OF PRESENT ILLNESS::Daniel Schmitt is a 84 y.o. male who is here for an initial consultation of his locally advanced basal cell carcinoma of the face. He has a large mass in the right temporal region, a smaller mass under his left eye and a third lesion on his forehead. He reports that the area of concerns have been growing for over 17 years. He is accompanied by his daughter.   He underwent recent biopsies on 09/28/2017 showing basal cell carcinoma to his left midline mid anterior scalp, right inferior medial temple and left superior lateral infraorbital.  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   On review of systems the patient has some bleeding to the growth on his right temporal region. He denies pain, or any other symptoms or complaints at this time. The patient is pleasantly demented. According to daughter he often times will pick at the larger lesion causing bleeding  PAST MEDICAL HISTORY:  has a past medical history of Basal cell carcinoma (BCC) of face (10/11/2017), NSTEMI (non-ST elevation myocardial infarction) ([place]) (01/2016), and Vertigo.    PAST SURGICAL HISTORY: Past Surgical History:  Procedure Laterality Date  . CARDIAC CATHETERIZATION N/A 02/17/2016   Procedure: Left Heart Cath and Coronary Angiography;  Surgeon: Jettie Booze, MD;  Location: [facility];  Service: Cardiovascular;  Laterality: N/A;  . CARDIAC CATHETERIZATION N/A 02/17/2016   Procedure: Coronary Stent Intervention;  Surgeon: Jettie Booze, MD;  Location: [facility];  Service:  Cardiovascular;  Laterality: N/A;  . HERNIA REPAIR      FAMILY HISTORY: family history is not on file.  SOCIAL HISTORY:  reports that he has quit smoking. he has never used smokeless tobacco. He reports that he does not drink alcohol or use drugs.  ALLERGIES: Aspirin  MEDICATIONS:  Current Outpatient Medications  Medication Sig Dispense Refill  . acetaminophen (TYLENOL) 500 MG tablet Take 1,000 mg by mouth every 6 (six) hours as needed for pain.    Marland Kitchen amLODipine (NORVASC) 10 MG tablet Take 1 tablet (10 mg total) by mouth daily. 30 tablet 9  . aspirin EC 81 MG EC tablet Take 1 tablet (81 mg total) by mouth daily.    Marland Kitchen atorvastatin (LIPITOR) 80 MG tablet Take 1 tablet (80 mg total) by mouth daily at 6 PM. 30 tablet 12  . carvedilol (COREG) 12.5 MG tablet TAKE 1 TABLET (12.5 MG TOTAL) BY MOUTH 2 (TWO) TIMES DAILY WITH A MEAL. 180 tablet 2  . clopidogrel (PLAVIX) 75 MG tablet Take 1 tablet (75 mg total) by mouth daily. 90 tablet 3  . donepezil (ARICEPT) 5 MG tablet Take 5 mg by mouth at bedtime.    Marland Kitchen levothyroxine (SYNTHROID, LEVOTHROID) 25 MCG tablet Take 25 mcg by mouth daily before breakfast.    . nitroGLYCERIN (NITROSTAT) 0.4 MG SL tablet Place 1 tablet (0.4 mg total) under the tongue every 5 (five) minutes x 3 doses as needed for chest pain. (Patient not taking: Reported on 10/19/2017) 25 tablet 1   No current facility-administered medications for this encounter.  REVIEW OF SYSTEMS:  A 15 point review of systems is documented in the electronic medical record. This was obtained by the nursing staff. However, I reviewed this with the patient to discuss relevant findings and make appropriate changes.     PHYSICAL EXAM:  height is 5' 2"  (1.575 m) and weight is 131 lb 11.2 oz (59.7 kg). His oral temperature is 97.4 F (36.3 C) (abnormal). His blood pressure is 126/59 (abnormal) and his pulse is 94. His respiration is 18 and oxygen saturation is 97%.    Left superior lateral infraorbital  mass measures 1.75 cm x 1 cm  Left midline mid anterior scalp measures 1 x 1 cm and is a dark brown/black color Right inferior medial temple measures 7 x 5 cm, and has a thickness of approximately 1.5 cm, this area began bleeding during his evaluation    ECOG = 2   LABORATORY DATA:  Lab Results  Component Value Date   WBC 11.6 (H) 10/11/2017   HGB 12.0 (L) 04/09/2016   HCT 37.7 (L) 10/11/2017   MCV 97.7 10/11/2017   PLT 243 10/11/2017   NEUTROABS 6.9 (H) 10/11/2017   Lab Results  Component Value Date   NA 145 10/11/2017   K 4.4 10/11/2017   CL 112 (H) 10/11/2017   CO2 26 10/11/2017   GLUCOSE 117 10/11/2017   CREATININE 1.90 (H) 10/11/2017   CALCIUM 9.5 10/11/2017      RADIOGRAPHY: No results found.    IMPRESSION: Advanced basal cell carcinomas of the scalp/face region. The patient was seen by Dr. Sarajane Jews and the patient was not felt to be a candidate for surgery particularly given the advanced nature of his large right temporal lesion. The patient has met with Dr. Burney Gauze and the patient is not felt to be a candidate for Erivedge (Hedgehog Pathway inhibitor) therapy. Patient would be a candidate for palliative radiation therapy directed to his basal cell carcinomas. I discussed options to consider with the patient and his daughter. We discussed treatment of the larger lesion which is symptomatic with bleeding versus treating all 3 areas.  The patient and his daughter who has health care power of attorney recommends proceeding with treatment to all 3 areas. I discussed the course of treatment side effects and potential toxicities of radiation therapy in this situation with the patient is daughter. They appear to understand wish to proceed with planned course of treatment.  PLAN: Patient will return tomorrow for a CT simulation and planning. I anticipate approximately 15 treatments to all 3 areas. He will have shields placed over his eyes during his radiation treatment given  the close proximity of these structures to this anticipated radiation fields. He will be treated with electron therapy.     ------------------------------------------------  Blair Promise, PhD, MD  This document serves as a record of services personally performed by Blair Promise, PhD, MD. It was created on his behalf by Margit Banda, a trained medical scribe. The creation of this record is based on the scribe's personal observations and the provider's statements to them.

## 2017-10-20 ENCOUNTER — Ambulatory Visit
Admission: RE | Admit: 2017-10-20 | Discharge: 2017-10-20 | Disposition: A | Discharge status: Home / Self Care | Payer: Medicare Other | Source: Ambulatory Visit | Attending: Radiation Oncology | Admitting: Radiation Oncology

## 2017-10-20 DIAGNOSIS — C44310 Basal cell carcinoma of skin of unspecified parts of face: Secondary | ICD-10-CM | POA: Insufficient documentation

## 2017-10-20 DIAGNOSIS — Z510 Encounter for antineoplastic radiation therapy: Secondary | ICD-10-CM | POA: Diagnosis not present

## 2017-10-20 NOTE — Progress Notes (Signed)
  Radiation Oncology         [phone] ________________________________  Name: Daniel Schmitt MRN: [id number]  Date: 10/20/2017  DOB: 1925-10-29  Electron Simulation Note    ICD-10-CM   1. Basal cell carcinoma (BCC) of face C44.310     Status: outpatient  NARRATIVE: The patient was brought to the treatment unit and placed in the planned treatment position. The clinical setup was verified. The patient then had set up of his custom electron cutout field encompassing the 3 separate basal cell carcinomas. Previously developed custom electron cutouts were used for the patient's treatment and formed nicely to the target areas. The patient will be treated with 6MeV electrons for the smaller scalp lesion and lesion underneath the left eyelid. 9 MeV electrons will be used for the treatments for the larger lesion along the right temporal area. Bolus will be used to improve the dose homogeneity. A special port plan is requested for treatment.  PLAN: The patient will receive 50 Gy in 25 fractions for all 3 areas  (2.5 cGy per fraction). -----------------------------------  Blair Promise, PhD, MD  This document serves as a record of services personally performed by Blair Promise, PhD, MD. It was created on his behalf by Margit Banda, a trained medical scribe. The creation of this record is based on the scribe's personal observations and the provider's statements to them.

## 2017-10-20 NOTE — Progress Notes (Signed)
  Radiation Oncology         [phone] ________________________________  Name: DERRICK TIEGS MRN: [id number]  Date: 10/20/2017  DOB: 1925-11-08  SIMULATION AND TREATMENT PLANNING NOTE    ICD-10-CM   1. Basal cell carcinoma (BCC) of face C44.310     DIAGNOSIS:  The encounter diagnosis was Basal cell carcinoma (BCC) of face.  NARRATIVE:  Patient was taken to the CT simulator table. He was placed in the supine position in a custom head cast was developed for treatment. The area was opened over the 3 treatment areas. The patient then had development of 2 custom shields 1 along the right eye and the second one along the left eye given the close proximity of his electron fields to the orbital region  PLAN:  The patient was then transferred to the linac and had set up of his 3 separate electron fields.  -----------------------------------  Blair Promise, PhD, MD    This document serves as a record of services personally performed by Blair Promise, PhD, MD. It was created on his behalf by Margit Banda, a trained medical scribe. The creation of this record is based on the scribe's personal observations and the provider's statements to them.

## 2017-10-24 ENCOUNTER — Ambulatory Visit
Admission: RE | Admit: 2017-10-24 | Discharge: 2017-10-24 | Disposition: A | Discharge status: Home / Self Care | Payer: Medicare Other | Source: Ambulatory Visit | Attending: Radiation Oncology | Admitting: Radiation Oncology

## 2017-10-24 DIAGNOSIS — C44310 Basal cell carcinoma of skin of unspecified parts of face: Secondary | ICD-10-CM | POA: Diagnosis not present

## 2017-10-24 DIAGNOSIS — Z510 Encounter for antineoplastic radiation therapy: Secondary | ICD-10-CM | POA: Diagnosis not present

## 2017-10-24 NOTE — Progress Notes (Signed)
Simulation verification  The patient was brought to the treatment machine and placed in the plan treatment position.  Clinical set up was verified to ensure that the target region is appropriately covered for the patient's upcoming electron treatment.  The targeted volume of tissue is appropriately covered by the radiation field.  Based on my personal review, I approve the simulation verification.  The patient's treatment will proceed as planned.  ------------------------------------------------  -----------------------------------  Blair Promise, PhD, MD

## 2017-10-25 ENCOUNTER — Ambulatory Visit
Admission: RE | Admit: 2017-10-25 | Discharge: 2017-10-25 | Disposition: A | Discharge status: Home / Self Care | Payer: Medicare Other | Source: Ambulatory Visit | Attending: Radiation Oncology | Admitting: Radiation Oncology

## 2017-10-25 DIAGNOSIS — C44310 Basal cell carcinoma of skin of unspecified parts of face: Secondary | ICD-10-CM

## 2017-10-25 DIAGNOSIS — Z510 Encounter for antineoplastic radiation therapy: Secondary | ICD-10-CM | POA: Diagnosis not present

## 2017-10-25 MED ORDER — SONAFINE EX EMUL
1.0000 "application " | Freq: Once | CUTANEOUS | Status: AC
  Administered 2017-10-25: 1 via TOPICAL

## 2017-10-25 NOTE — Progress Notes (Signed)
Pt here for patient teaching.  Pt given Radiation and You booklet and Sonafine.  Reviewed areas of pertinence such as fatigue and skin changes . Pt able to give teach back of to pat skin and use unscented/gentle soap,apply Sonafine bid and avoid applying anything to skin within 4 hours of treatment. Pt demonstrated understanding and verbalizes understanding of information given and will contact nursing with any questions or concerns.        

## 2017-10-26 ENCOUNTER — Ambulatory Visit
Admission: RE | Admit: 2017-10-26 | Discharge: 2017-10-26 | Disposition: A | Discharge status: Home / Self Care | Payer: Medicare Other | Source: Ambulatory Visit | Attending: Radiation Oncology | Admitting: Radiation Oncology

## 2017-10-26 DIAGNOSIS — Z510 Encounter for antineoplastic radiation therapy: Secondary | ICD-10-CM | POA: Diagnosis not present

## 2017-10-26 DIAGNOSIS — C44310 Basal cell carcinoma of skin of unspecified parts of face: Secondary | ICD-10-CM | POA: Diagnosis not present

## 2017-10-27 ENCOUNTER — Ambulatory Visit
Admission: RE | Admit: 2017-10-27 | Discharge: 2017-10-27 | Disposition: A | Discharge status: Home / Self Care | Payer: Medicare Other | Source: Ambulatory Visit | Attending: Radiation Oncology | Admitting: Radiation Oncology

## 2017-10-27 DIAGNOSIS — C44310 Basal cell carcinoma of skin of unspecified parts of face: Secondary | ICD-10-CM | POA: Diagnosis not present

## 2017-10-27 DIAGNOSIS — Z510 Encounter for antineoplastic radiation therapy: Secondary | ICD-10-CM | POA: Diagnosis not present

## 2017-10-28 ENCOUNTER — Ambulatory Visit
Admission: RE | Admit: 2017-10-28 | Discharge: 2017-10-28 | Disposition: A | Discharge status: Home / Self Care | Payer: Medicare Other | Source: Ambulatory Visit | Attending: Radiation Oncology | Admitting: Radiation Oncology

## 2017-10-28 DIAGNOSIS — Z510 Encounter for antineoplastic radiation therapy: Secondary | ICD-10-CM | POA: Diagnosis not present

## 2017-10-28 DIAGNOSIS — C44310 Basal cell carcinoma of skin of unspecified parts of face: Secondary | ICD-10-CM | POA: Diagnosis not present

## 2017-10-31 ENCOUNTER — Ambulatory Visit
Admission: RE | Admit: 2017-10-31 | Discharge: 2017-10-31 | Disposition: A | Discharge status: Home / Self Care | Payer: Medicare Other | Source: Ambulatory Visit | Attending: Radiation Oncology | Admitting: Radiation Oncology

## 2017-10-31 ENCOUNTER — Ambulatory Visit: Payer: Medicare Other

## 2017-10-31 DIAGNOSIS — Z510 Encounter for antineoplastic radiation therapy: Secondary | ICD-10-CM | POA: Diagnosis not present

## 2017-10-31 DIAGNOSIS — C44310 Basal cell carcinoma of skin of unspecified parts of face: Secondary | ICD-10-CM | POA: Diagnosis not present

## 2017-11-01 ENCOUNTER — Ambulatory Visit
Admission: RE | Admit: 2017-11-01 | Discharge: 2017-11-01 | Disposition: A | Discharge status: Home / Self Care | Payer: Medicare Other | Source: Ambulatory Visit | Attending: Radiation Oncology | Admitting: Radiation Oncology

## 2017-11-01 ENCOUNTER — Ambulatory Visit: Payer: Medicare Other

## 2017-11-01 DIAGNOSIS — Z510 Encounter for antineoplastic radiation therapy: Secondary | ICD-10-CM | POA: Diagnosis not present

## 2017-11-01 DIAGNOSIS — C44310 Basal cell carcinoma of skin of unspecified parts of face: Secondary | ICD-10-CM | POA: Diagnosis not present

## 2017-11-02 ENCOUNTER — Ambulatory Visit: Payer: Medicare Other

## 2017-11-02 ENCOUNTER — Ambulatory Visit
Admission: RE | Admit: 2017-11-02 | Discharge: 2017-11-02 | Disposition: A | Discharge status: Home / Self Care | Payer: Medicare Other | Source: Ambulatory Visit | Attending: Radiation Oncology | Admitting: Radiation Oncology

## 2017-11-02 DIAGNOSIS — Z510 Encounter for antineoplastic radiation therapy: Secondary | ICD-10-CM | POA: Diagnosis not present

## 2017-11-02 DIAGNOSIS — C44310 Basal cell carcinoma of skin of unspecified parts of face: Secondary | ICD-10-CM | POA: Diagnosis not present

## 2017-11-03 ENCOUNTER — Ambulatory Visit
Admission: RE | Admit: 2017-11-03 | Discharge: 2017-11-03 | Disposition: A | Discharge status: Home / Self Care | Payer: Medicare Other | Source: Ambulatory Visit | Attending: Radiation Oncology | Admitting: Radiation Oncology

## 2017-11-03 ENCOUNTER — Ambulatory Visit: Payer: Medicare Other

## 2017-11-03 DIAGNOSIS — Z510 Encounter for antineoplastic radiation therapy: Secondary | ICD-10-CM | POA: Diagnosis not present

## 2017-11-03 DIAGNOSIS — C44310 Basal cell carcinoma of skin of unspecified parts of face: Secondary | ICD-10-CM | POA: Diagnosis not present

## 2017-11-04 ENCOUNTER — Ambulatory Visit: Payer: Medicare Other

## 2017-11-04 ENCOUNTER — Ambulatory Visit
Admission: RE | Admit: 2017-11-04 | Discharge: 2017-11-04 | Disposition: A | Discharge status: Home / Self Care | Payer: Medicare Other | Source: Ambulatory Visit | Attending: Radiation Oncology | Admitting: Radiation Oncology

## 2017-11-04 DIAGNOSIS — Z510 Encounter for antineoplastic radiation therapy: Secondary | ICD-10-CM | POA: Diagnosis not present

## 2017-11-04 DIAGNOSIS — C44310 Basal cell carcinoma of skin of unspecified parts of face: Secondary | ICD-10-CM | POA: Diagnosis not present

## 2017-11-07 ENCOUNTER — Other Ambulatory Visit: Payer: Self-pay | Admitting: Cardiovascular Disease

## 2017-11-07 ENCOUNTER — Ambulatory Visit
Admission: RE | Admit: 2017-11-07 | Discharge: 2017-11-07 | Disposition: A | Discharge status: Home / Self Care | Payer: Medicare Other | Source: Ambulatory Visit | Attending: Radiation Oncology | Admitting: Radiation Oncology

## 2017-11-07 DIAGNOSIS — C44310 Basal cell carcinoma of skin of unspecified parts of face: Secondary | ICD-10-CM | POA: Diagnosis not present

## 2017-11-07 DIAGNOSIS — Z510 Encounter for antineoplastic radiation therapy: Secondary | ICD-10-CM | POA: Diagnosis not present

## 2017-11-07 MED ORDER — NITROGLYCERIN 0.4 MG SL SUBL: 0.4000 mg | SUBLINGUAL_TABLET | SUBLINGUAL | 7 refills | Status: AC | PRN

## 2017-11-08 ENCOUNTER — Ambulatory Visit
Admission: RE | Admit: 2017-11-08 | Discharge: 2017-11-08 | Disposition: A | Discharge status: Home / Self Care | Payer: Medicare Other | Source: Ambulatory Visit | Attending: Radiation Oncology | Admitting: Radiation Oncology

## 2017-11-08 ENCOUNTER — Other Ambulatory Visit: Payer: Self-pay | Admitting: *Deleted

## 2017-11-08 DIAGNOSIS — C44310 Basal cell carcinoma of skin of unspecified parts of face: Secondary | ICD-10-CM

## 2017-11-08 DIAGNOSIS — Z510 Encounter for antineoplastic radiation therapy: Secondary | ICD-10-CM | POA: Diagnosis not present

## 2017-11-09 ENCOUNTER — Ambulatory Visit
Admission: RE | Admit: 2017-11-09 | Discharge: 2017-11-09 | Disposition: A | Discharge status: Home / Self Care | Payer: Medicare Other | Source: Ambulatory Visit | Attending: Radiation Oncology | Admitting: Radiation Oncology

## 2017-11-09 ENCOUNTER — Inpatient Hospital Stay: Payer: Medicare Other | Attending: Hematology & Oncology | Admitting: Hematology & Oncology

## 2017-11-09 ENCOUNTER — Inpatient Hospital Stay: Payer: Medicare Other

## 2017-11-09 DIAGNOSIS — Z510 Encounter for antineoplastic radiation therapy: Secondary | ICD-10-CM | POA: Diagnosis not present

## 2017-11-09 DIAGNOSIS — C44310 Basal cell carcinoma of skin of unspecified parts of face: Secondary | ICD-10-CM | POA: Diagnosis not present

## 2017-11-10 ENCOUNTER — Ambulatory Visit
Admission: RE | Admit: 2017-11-10 | Discharge: 2017-11-10 | Disposition: A | Discharge status: Home / Self Care | Payer: Medicare Other | Source: Ambulatory Visit | Attending: Radiation Oncology | Admitting: Radiation Oncology

## 2017-11-10 DIAGNOSIS — Z510 Encounter for antineoplastic radiation therapy: Secondary | ICD-10-CM | POA: Diagnosis not present

## 2017-11-10 DIAGNOSIS — C44310 Basal cell carcinoma of skin of unspecified parts of face: Secondary | ICD-10-CM | POA: Diagnosis not present

## 2017-11-11 ENCOUNTER — Ambulatory Visit
Admission: RE | Admit: 2017-11-11 | Discharge: 2017-11-11 | Disposition: A | Discharge status: Home / Self Care | Payer: Medicare Other | Source: Ambulatory Visit | Attending: Radiation Oncology | Admitting: Radiation Oncology

## 2017-11-11 DIAGNOSIS — Z510 Encounter for antineoplastic radiation therapy: Secondary | ICD-10-CM | POA: Diagnosis not present

## 2017-11-11 DIAGNOSIS — C44310 Basal cell carcinoma of skin of unspecified parts of face: Secondary | ICD-10-CM | POA: Diagnosis not present

## 2017-11-14 ENCOUNTER — Other Ambulatory Visit: Payer: Self-pay | Admitting: Cardiovascular Disease

## 2017-11-14 ENCOUNTER — Ambulatory Visit
Admission: RE | Admit: 2017-11-14 | Discharge: 2017-11-14 | Disposition: A | Discharge status: Home / Self Care | Payer: Medicare Other | Source: Ambulatory Visit | Attending: Radiation Oncology | Admitting: Radiation Oncology

## 2017-11-14 DIAGNOSIS — Z510 Encounter for antineoplastic radiation therapy: Secondary | ICD-10-CM | POA: Diagnosis not present

## 2017-11-14 DIAGNOSIS — C44310 Basal cell carcinoma of skin of unspecified parts of face: Secondary | ICD-10-CM | POA: Diagnosis not present

## 2017-11-15 ENCOUNTER — Ambulatory Visit
Admission: RE | Admit: 2017-11-15 | Discharge: 2017-11-15 | Disposition: A | Discharge status: Home / Self Care | Payer: Medicare Other | Source: Ambulatory Visit | Attending: Radiation Oncology | Admitting: Radiation Oncology

## 2017-11-15 DIAGNOSIS — Z510 Encounter for antineoplastic radiation therapy: Secondary | ICD-10-CM | POA: Diagnosis not present

## 2017-11-15 DIAGNOSIS — C44310 Basal cell carcinoma of skin of unspecified parts of face: Secondary | ICD-10-CM | POA: Diagnosis not present

## 2017-11-16 ENCOUNTER — Ambulatory Visit
Admission: RE | Admit: 2017-11-16 | Discharge: 2017-11-16 | Disposition: A | Discharge status: Home / Self Care | Payer: Medicare Other | Source: Ambulatory Visit | Attending: Radiation Oncology | Admitting: Radiation Oncology

## 2017-11-16 DIAGNOSIS — Z510 Encounter for antineoplastic radiation therapy: Secondary | ICD-10-CM | POA: Diagnosis not present

## 2017-11-16 DIAGNOSIS — C44310 Basal cell carcinoma of skin of unspecified parts of face: Secondary | ICD-10-CM | POA: Diagnosis not present

## 2017-11-17 ENCOUNTER — Ambulatory Visit
Admission: RE | Admit: 2017-11-17 | Discharge: 2017-11-17 | Disposition: A | Discharge status: Home / Self Care | Payer: Medicare Other | Source: Ambulatory Visit | Attending: Radiation Oncology | Admitting: Radiation Oncology

## 2017-11-17 DIAGNOSIS — Z510 Encounter for antineoplastic radiation therapy: Secondary | ICD-10-CM | POA: Diagnosis not present

## 2017-11-17 DIAGNOSIS — C44310 Basal cell carcinoma of skin of unspecified parts of face: Secondary | ICD-10-CM | POA: Diagnosis not present

## 2017-11-18 ENCOUNTER — Ambulatory Visit: Payer: Medicare Other

## 2017-11-18 ENCOUNTER — Ambulatory Visit
Admission: RE | Admit: 2017-11-18 | Discharge: 2017-11-18 | Disposition: A | Discharge status: Home / Self Care | Payer: Medicare Other | Source: Ambulatory Visit | Attending: Radiation Oncology | Admitting: Radiation Oncology

## 2017-11-18 DIAGNOSIS — C44310 Basal cell carcinoma of skin of unspecified parts of face: Secondary | ICD-10-CM | POA: Diagnosis not present

## 2017-11-18 DIAGNOSIS — Z510 Encounter for antineoplastic radiation therapy: Secondary | ICD-10-CM | POA: Diagnosis not present

## 2017-11-21 ENCOUNTER — Ambulatory Visit: Payer: Medicare Other

## 2017-11-21 ENCOUNTER — Encounter: Payer: Self-pay | Admitting: Radiation Oncology

## 2017-11-21 NOTE — Progress Notes (Signed)
  Radiation Oncology         [phone] ________________________________  Name: Daniel Schmitt MRN: [id number]  Date: 11/21/2017  DOB: January 23, 1926  End of Treatment Note  Diagnosis:  Advanced basal cell carcinoma of the scalp and face    Indication for treatment:  Palliative       Radiation treatment dates: 10/24/17-11/18/17  Site/dose:  1) Hn_lt face/ 50 Gy in 20 fractions 1) Hn_scalp/ 50 Gy in 20 fractions 1) Hn_rt temp/ 50 Gy in 20 fractions  Beams/energy:   1) Electron/ 6E 2) Electron/ 6E 3) Electron/ 9E  Narrative: The patient tolerated radiation treatment relatively well. During treatment the patient complained of one episode of bleeding over the right temporal lobe. He also noted yellow drainage with an odor. The patient exhibited a large right temporal lesion that continued to regress along the posterior aspect with treatment.  Plan: The patient has completed radiation treatment. The patient will return to radiation oncology clinic for routine followup in one week. I advised them to call or return sooner if they have any questions or concerns related to their recovery or treatment.  -----------------------------------  Blair Promise, PhD, MD  This document serves as a record of services personally performed by Gery Pray, MD. It was created on his behalf by Bethann Humble, a trained medical scribe. The creation of this record is based on the scribe's personal observations and the provider's statements to them. This document has been checked and approved by the attending provider.

## 2017-11-22 ENCOUNTER — Ambulatory Visit: Payer: Medicare Other

## 2017-11-23 ENCOUNTER — Ambulatory Visit: Payer: Medicare Other

## 2017-11-24 ENCOUNTER — Encounter: Payer: Self-pay | Admitting: Radiation Oncology

## 2017-11-24 ENCOUNTER — Ambulatory Visit: Payer: Medicare Other

## 2017-11-24 ENCOUNTER — Other Ambulatory Visit: Payer: Self-pay

## 2017-11-24 ENCOUNTER — Ambulatory Visit
Admission: RE | Admit: 2017-11-24 | Discharge: 2017-11-24 | Disposition: A | Discharge status: Home / Self Care | Payer: Medicare Other | Source: Ambulatory Visit | Attending: Radiation Oncology | Admitting: Radiation Oncology

## 2017-11-24 VITALS — BP 116/69 | HR 76 | Temp 97.7°F | Ht 62.0 in | Wt 127.6 lb

## 2017-11-24 DIAGNOSIS — Z79899 Other long term (current) drug therapy: Secondary | ICD-10-CM | POA: Insufficient documentation

## 2017-11-24 DIAGNOSIS — Z923 Personal history of irradiation: Secondary | ICD-10-CM | POA: Insufficient documentation

## 2017-11-24 DIAGNOSIS — Z886 Allergy status to analgesic agent status: Secondary | ICD-10-CM | POA: Diagnosis not present

## 2017-11-24 DIAGNOSIS — C44310 Basal cell carcinoma of skin of unspecified parts of face: Secondary | ICD-10-CM | POA: Insufficient documentation

## 2017-11-24 DIAGNOSIS — Z7982 Long term (current) use of aspirin: Secondary | ICD-10-CM | POA: Insufficient documentation

## 2017-11-24 NOTE — Progress Notes (Signed)
  Radiation Oncology         [phone] ________________________________  Name: Daniel Schmitt MRN: [id number]  Date: 11/24/2017  DOB: 10-10-1925  Follow-Up Visit Note  CC: [name], MD  Volanda Napoleon, MD    ICD-10-CM   1. Basal cell carcinoma (BCC) of face C44.310     Diagnosis: Locally advanced basal cell carcinoma  Interval Since Last Radiation:  1 weeks  Narrative:  The patient returns today for routine follow-up.  Patient did have one episode of significant bleeding according to the daughter.  This was stopped with direct pressure.  The patient's dementia he does pick at the larger lesion along the right temporal area.                              ALLERGIES:  is allergic to aspirin.  Meds: Current Outpatient Medications  Medication Sig Dispense Refill  . acetaminophen (TYLENOL) 500 MG tablet Take 1,000 mg by mouth every 6 (six) hours as needed for pain.    Marland Kitchen amLODipine (NORVASC) 10 MG tablet Take 1 tablet (10 mg total) by mouth daily. 30 tablet 9  . aspirin EC 81 MG EC tablet Take 1 tablet (81 mg total) by mouth daily.    Marland Kitchen atorvastatin (LIPITOR) 80 MG tablet Take 1 tablet (80 mg total) by mouth daily at 6 PM. 30 tablet 12  . carvedilol (COREG) 12.5 MG tablet TAKE 1 TABLET (12.5 MG TOTAL) BY MOUTH 2 (TWO) TIMES DAILY WITH A MEAL. 180 tablet 2  . clopidogrel (PLAVIX) 75 MG tablet TAKE 1 TABLET BY MOUTH EVERY DAY 90 tablet 2  . donepezil (ARICEPT) 5 MG tablet Take 5 mg by mouth at bedtime.    Marland Kitchen levothyroxine (SYNTHROID, LEVOTHROID) 25 MCG tablet Take 25 mcg by mouth daily before breakfast.    . nitroGLYCERIN (NITROSTAT) 0.4 MG SL tablet Place 1 tablet (0.4 mg total) under the tongue every 5 (five) minutes x 3 doses as needed for chest pain. 25 tablet 7  . Wound Dressings (SONAFINE EX) Apply topically.     No current facility-administered medications for this encounter.     Physical Findings: The patient is in no acute distress. Patient is alert and oriented.  height is 5\' 2"  (1.575 m) and weight is 127 lb 9.6 oz (57.9 kg). His oral temperature is 97.7 F (36.5 C). His blood pressure is 116/69 and his pulse is 76. His oxygen saturation is 97%. .  All 3 lesions have regressed in size.  Continued radiation reaction at this point.  No significant drainage noted.  Lab Findings: Lab Results  Component Value Date   WBC 11.6 (H) 10/11/2017   HGB 12.0 (L) 04/09/2016   HCT 37.7 (L) 10/11/2017   MCV 97.7 10/11/2017   PLT 243 10/11/2017    Radiographic Findings: No results found.  Impression:  The patient is recovering from the effects of radiation.  Tumors continue to regress  Plan: Routine follow-up in 1 month  ____________________________________ Gery Pray, MD

## 2017-11-24 NOTE — Progress Notes (Addendum)
Daniel Schmitt is here for follow up.  He reports having burning in the lesions on his right temple and under his left eye.  He reports having yellow drainage from the right temple lesion and said it bleeds and is hard to get it to stop.  His left eye and the skin surrounding it are red.  He reports that his eye is dry and feels like there is gravel in it.  He is feeling fatigued.  The right temple lesion appears smaller with yellow drainage present.  The area under his left eye is red and scabbed.  He is using sonafine.  BP 116/69 (BP Location: Left Arm, Patient Position: Sitting)   Pulse 76   Temp 97.7 F (36.5 C) (Oral)   Ht 5\' 2"  (1.575 m)   Wt 127 lb 9.6 oz (57.9 kg)   SpO2 97%   BMI 23.34 kg/m    Wt Readings from Last 3 Encounters:  11/24/17 127 lb 9.6 oz (57.9 kg)  10/19/17 131 lb 11.2 oz (59.7 kg)  10/11/17 130 lb (59 kg)

## 2017-11-25 ENCOUNTER — Ambulatory Visit: Payer: Medicare Other

## 2017-12-14 DIAGNOSIS — Z125 Encounter for screening for malignant neoplasm of prostate: Secondary | ICD-10-CM | POA: Diagnosis not present

## 2017-12-14 DIAGNOSIS — E039 Hypothyroidism, unspecified: Secondary | ICD-10-CM | POA: Diagnosis not present

## 2017-12-14 DIAGNOSIS — I10 Essential (primary) hypertension: Secondary | ICD-10-CM | POA: Diagnosis not present

## 2017-12-14 DIAGNOSIS — Z0000 Encounter for general adult medical examination without abnormal findings: Secondary | ICD-10-CM | POA: Diagnosis not present

## 2017-12-14 DIAGNOSIS — R739 Hyperglycemia, unspecified: Secondary | ICD-10-CM | POA: Diagnosis not present

## 2017-12-21 DIAGNOSIS — E039 Hypothyroidism, unspecified: Secondary | ICD-10-CM | POA: Diagnosis not present

## 2017-12-21 DIAGNOSIS — K219 Gastro-esophageal reflux disease without esophagitis: Secondary | ICD-10-CM | POA: Diagnosis not present

## 2017-12-21 DIAGNOSIS — I10 Essential (primary) hypertension: Secondary | ICD-10-CM | POA: Diagnosis not present

## 2017-12-21 DIAGNOSIS — E7800 Pure hypercholesterolemia, unspecified: Secondary | ICD-10-CM | POA: Diagnosis not present

## 2017-12-21 DIAGNOSIS — C4490 Unspecified malignant neoplasm of skin, unspecified: Secondary | ICD-10-CM | POA: Diagnosis not present

## 2018-01-11 ENCOUNTER — Other Ambulatory Visit: Payer: Self-pay | Admitting: Physician Assistant

## 2018-01-24 ENCOUNTER — Encounter (HOSPITAL_COMMUNITY): Payer: Self-pay

## 2018-01-24 ENCOUNTER — Inpatient Hospital Stay (HOSPITAL_COMMUNITY)
Admission: EM | Admit: 2018-01-24 | Discharge: 2018-01-29 | DRG: 470 | Disposition: A | Discharge status: Skilled Nursing Facility | Payer: Medicare Other | Attending: Family Medicine | Admitting: Family Medicine

## 2018-01-24 ENCOUNTER — Emergency Department (HOSPITAL_COMMUNITY): Payer: Medicare Other

## 2018-01-24 ENCOUNTER — Other Ambulatory Visit: Payer: Self-pay

## 2018-01-24 DIAGNOSIS — Y92009 Unspecified place in unspecified non-institutional (private) residence as the place of occurrence of the external cause: Secondary | ICD-10-CM

## 2018-01-24 DIAGNOSIS — M25559 Pain in unspecified hip: Secondary | ICD-10-CM | POA: Diagnosis not present

## 2018-01-24 DIAGNOSIS — S51012A Laceration without foreign body of left elbow, initial encounter: Secondary | ICD-10-CM | POA: Diagnosis present

## 2018-01-24 DIAGNOSIS — Z7982 Long term (current) use of aspirin: Secondary | ICD-10-CM | POA: Diagnosis not present

## 2018-01-24 DIAGNOSIS — M2550 Pain in unspecified joint: Secondary | ICD-10-CM | POA: Diagnosis not present

## 2018-01-24 DIAGNOSIS — Z85828 Personal history of other malignant neoplasm of skin: Secondary | ICD-10-CM | POA: Diagnosis not present

## 2018-01-24 DIAGNOSIS — R52 Pain, unspecified: Secondary | ICD-10-CM | POA: Diagnosis not present

## 2018-01-24 DIAGNOSIS — I252 Old myocardial infarction: Secondary | ICD-10-CM | POA: Diagnosis not present

## 2018-01-24 DIAGNOSIS — Z955 Presence of coronary angioplasty implant and graft: Secondary | ICD-10-CM

## 2018-01-24 DIAGNOSIS — M6281 Muscle weakness (generalized): Secondary | ICD-10-CM | POA: Diagnosis not present

## 2018-01-24 DIAGNOSIS — T148XXA Other injury of unspecified body region, initial encounter: Secondary | ICD-10-CM | POA: Diagnosis not present

## 2018-01-24 DIAGNOSIS — Z886 Allergy status to analgesic agent status: Secondary | ICD-10-CM | POA: Diagnosis not present

## 2018-01-24 DIAGNOSIS — Z7401 Bed confinement status: Secondary | ICD-10-CM | POA: Diagnosis not present

## 2018-01-24 DIAGNOSIS — N183 Chronic kidney disease, stage 3 (moderate): Secondary | ICD-10-CM | POA: Diagnosis present

## 2018-01-24 DIAGNOSIS — Z471 Aftercare following joint replacement surgery: Secondary | ICD-10-CM | POA: Diagnosis not present

## 2018-01-24 DIAGNOSIS — S72012A Unspecified intracapsular fracture of left femur, initial encounter for closed fracture: Principal | ICD-10-CM | POA: Diagnosis present

## 2018-01-24 DIAGNOSIS — Z923 Personal history of irradiation: Secondary | ICD-10-CM

## 2018-01-24 DIAGNOSIS — F0390 Unspecified dementia without behavioral disturbance: Secondary | ICD-10-CM | POA: Diagnosis present

## 2018-01-24 DIAGNOSIS — Z7902 Long term (current) use of antithrombotics/antiplatelets: Secondary | ICD-10-CM

## 2018-01-24 DIAGNOSIS — Z9181 History of falling: Secondary | ICD-10-CM | POA: Diagnosis not present

## 2018-01-24 DIAGNOSIS — C44310 Basal cell carcinoma of skin of unspecified parts of face: Secondary | ICD-10-CM | POA: Diagnosis present

## 2018-01-24 DIAGNOSIS — Y9301 Activity, walking, marching and hiking: Secondary | ICD-10-CM | POA: Diagnosis present

## 2018-01-24 DIAGNOSIS — S0990XA Unspecified injury of head, initial encounter: Secondary | ICD-10-CM | POA: Diagnosis not present

## 2018-01-24 DIAGNOSIS — W109XXA Fall (on) (from) unspecified stairs and steps, initial encounter: Secondary | ICD-10-CM | POA: Diagnosis present

## 2018-01-24 DIAGNOSIS — N189 Chronic kidney disease, unspecified: Secondary | ICD-10-CM | POA: Diagnosis not present

## 2018-01-24 DIAGNOSIS — Z87891 Personal history of nicotine dependence: Secondary | ICD-10-CM | POA: Diagnosis not present

## 2018-01-24 DIAGNOSIS — Z79899 Other long term (current) drug therapy: Secondary | ICD-10-CM

## 2018-01-24 DIAGNOSIS — R2689 Other abnormalities of gait and mobility: Secondary | ICD-10-CM | POA: Diagnosis not present

## 2018-01-24 DIAGNOSIS — S72002D Fracture of unspecified part of neck of left femur, subsequent encounter for closed fracture with routine healing: Secondary | ICD-10-CM | POA: Diagnosis not present

## 2018-01-24 DIAGNOSIS — I214 Non-ST elevation (NSTEMI) myocardial infarction: Secondary | ICD-10-CM | POA: Diagnosis not present

## 2018-01-24 DIAGNOSIS — Z09 Encounter for follow-up examination after completed treatment for conditions other than malignant neoplasm: Secondary | ICD-10-CM

## 2018-01-24 DIAGNOSIS — Z23 Encounter for immunization: Secondary | ICD-10-CM | POA: Diagnosis not present

## 2018-01-24 DIAGNOSIS — Z96642 Presence of left artificial hip joint: Secondary | ICD-10-CM | POA: Diagnosis not present

## 2018-01-24 DIAGNOSIS — S72142A Displaced intertrochanteric fracture of left femur, initial encounter for closed fracture: Secondary | ICD-10-CM | POA: Diagnosis not present

## 2018-01-24 DIAGNOSIS — R9431 Abnormal electrocardiogram [ECG] [EKG]: Secondary | ICD-10-CM | POA: Diagnosis not present

## 2018-01-24 DIAGNOSIS — S72002A Fracture of unspecified part of neck of left femur, initial encounter for closed fracture: Secondary | ICD-10-CM | POA: Diagnosis present

## 2018-01-24 DIAGNOSIS — S72042A Displaced fracture of base of neck of left femur, initial encounter for closed fracture: Secondary | ICD-10-CM | POA: Diagnosis not present

## 2018-01-24 DIAGNOSIS — S51022A Laceration with foreign body of left elbow, initial encounter: Secondary | ICD-10-CM | POA: Diagnosis not present

## 2018-01-24 DIAGNOSIS — R41841 Cognitive communication deficit: Secondary | ICD-10-CM | POA: Diagnosis not present

## 2018-01-24 LAB — BASIC METABOLIC PANEL
ANION GAP: 9 (ref 5–15)
BUN: 25 mg/dL — ABNORMAL HIGH (ref 6–20)
CHLORIDE: 111 mmol/L (ref 101–111)
CO2: 23 mmol/L (ref 22–32)
Calcium: 9.4 mg/dL (ref 8.9–10.3)
Creatinine, Ser: 1.73 mg/dL — ABNORMAL HIGH (ref 0.61–1.24)
GFR calc Af Amer: 39 mL/min — ABNORMAL LOW (ref >60)
GFR calc non Af Amer: 33 mL/min — ABNORMAL LOW (ref >60)
Glucose, Bld: 112 mg/dL — ABNORMAL HIGH (ref 65–99)
POTASSIUM: 3.7 mmol/L (ref 3.5–5.1)
Sodium: 143 mmol/L (ref 135–145)

## 2018-01-24 LAB — CBC WITH DIFFERENTIAL/PLATELET
Basophils Absolute: 0 10*3/uL (ref 0.0–0.1)
Basophils Relative: 0 %
Eosinophils Absolute: 0.3 10*3/uL (ref 0.0–0.7)
Eosinophils Relative: 2 %
HEMATOCRIT: 34.5 % — AB (ref 39.0–52.0)
HEMOGLOBIN: 11.6 g/dL — AB (ref 13.0–17.0)
LYMPHS PCT: 19 %
Lymphs Abs: 2.6 10*3/uL (ref 0.7–4.0)
MCH: 32 pg (ref 26.0–34.0)
MCHC: 33.6 g/dL (ref 30.0–36.0)
MCV: 95 fL (ref 78.0–100.0)
MONOS PCT: 11 %
Monocytes Absolute: 1.5 10*3/uL — ABNORMAL HIGH (ref 0.1–1.0)
NEUTROS ABS: 9.5 10*3/uL — AB (ref 1.7–7.7)
Neutrophils Relative %: 68 %
Platelets: 186 10*3/uL (ref 150–400)
RBC: 3.63 MIL/uL — ABNORMAL LOW (ref 4.22–5.81)
RDW: 13.5 % (ref 11.5–15.5)
WBC: 13.9 10*3/uL — ABNORMAL HIGH (ref 4.0–10.5)

## 2018-01-24 LAB — ABO/RH: ABO/RH(D): O POS

## 2018-01-24 LAB — PROTIME-INR
INR: 1.03
Prothrombin Time: 13.4 seconds (ref 11.4–15.2)

## 2018-01-24 LAB — TYPE AND SCREEN
ABO/RH(D): O POS
Antibody Screen: NEGATIVE

## 2018-01-24 MED ORDER — MORPHINE SULFATE (PF) 2 MG/ML IV SOLN
0.5000 mg | INTRAVENOUS | Status: DC | PRN
  Administered 2018-01-25 – 2018-01-26 (×4): 0.5 mg via INTRAVENOUS
  Filled 2018-01-24 (×4): qty 1

## 2018-01-24 MED ORDER — AMLODIPINE BESYLATE 10 MG PO TABS
10.0000 mg | ORAL_TABLET | Freq: Every day | ORAL | Status: DC
  Administered 2018-01-24 – 2018-01-25 (×2): 10 mg via ORAL
  Filled 2018-01-24 (×2): qty 1

## 2018-01-24 MED ORDER — LEVOTHYROXINE SODIUM 25 MCG PO TABS
25.0000 ug | ORAL_TABLET | Freq: Every day | ORAL | Status: DC
  Administered 2018-01-25 – 2018-01-29 (×5): 25 ug via ORAL
  Filled 2018-01-24 (×5): qty 1

## 2018-01-24 MED ORDER — CARVEDILOL 25 MG PO TABS
25.0000 mg | ORAL_TABLET | Freq: Two times a day (BID) | ORAL | Status: DC
  Administered 2018-01-25 – 2018-01-29 (×6): 25 mg via ORAL
  Filled 2018-01-24 (×7): qty 1

## 2018-01-24 MED ORDER — DOCUSATE SODIUM 100 MG PO CAPS
100.0000 mg | ORAL_CAPSULE | Freq: Two times a day (BID) | ORAL | Status: DC
  Administered 2018-01-24 – 2018-01-25 (×3): 100 mg via ORAL
  Filled 2018-01-24 (×3): qty 1

## 2018-01-24 MED ORDER — HYDROCODONE-ACETAMINOPHEN 5-325 MG PO TABS
1.0000 | ORAL_TABLET | Freq: Four times a day (QID) | ORAL | Status: DC | PRN
  Administered 2018-01-24 – 2018-01-25 (×3): 2 via ORAL
  Administered 2018-01-25 – 2018-01-27 (×5): 1 via ORAL
  Administered 2018-01-28: 2 via ORAL
  Filled 2018-01-24 (×3): qty 2
  Filled 2018-01-24 (×3): qty 1
  Filled 2018-01-24: qty 2
  Filled 2018-01-24 (×4): qty 1

## 2018-01-24 MED ORDER — POLYETHYLENE GLYCOL 3350 17 G PO PACK
17.0000 g | PACK | Freq: Every day | ORAL | Status: DC | PRN
  Administered 2018-01-25: 17 g via ORAL
  Filled 2018-01-24: qty 1

## 2018-01-24 MED ORDER — ONDANSETRON HCL 4 MG/2ML IJ SOLN
4.0000 mg | Freq: Once | INTRAMUSCULAR | Status: AC | PRN
  Administered 2018-01-24: 4 mg via INTRAVENOUS
  Filled 2018-01-24: qty 2

## 2018-01-24 MED ORDER — TETANUS-DIPHTH-ACELL PERTUSSIS 5-2.5-18.5 LF-MCG/0.5 IM SUSP
0.5000 mL | Freq: Once | INTRAMUSCULAR | Status: AC
  Administered 2018-01-24: 0.5 mL via INTRAMUSCULAR
  Filled 2018-01-24: qty 0.5

## 2018-01-24 MED ORDER — SENNA 8.6 MG PO TABS
1.0000 | ORAL_TABLET | Freq: Two times a day (BID) | ORAL | Status: DC
  Administered 2018-01-24 – 2018-01-29 (×8): 8.6 mg via ORAL
  Filled 2018-01-24 (×8): qty 1

## 2018-01-24 MED ORDER — HYDROMORPHONE HCL 1 MG/ML IJ SOLN
0.5000 mg | INTRAMUSCULAR | Status: AC | PRN
  Administered 2018-01-24 (×2): 0.5 mg via INTRAVENOUS
  Filled 2018-01-24 (×2): qty 1

## 2018-01-24 MED ORDER — DONEPEZIL HCL 5 MG PO TABS
5.0000 mg | ORAL_TABLET | Freq: Every day | ORAL | Status: DC
  Administered 2018-01-24 – 2018-01-28 (×4): 5 mg via ORAL
  Filled 2018-01-24 (×8): qty 1

## 2018-01-24 NOTE — H&P (View-Only) (Signed)
Reason for Consult: Left femoral neck fx  Referring Physician: Dr. Sherwood Gambler   HPI: Daniel Schmitt is an 84 y.o. male, with PMH significant for his of myocardial infarction, CKD, hx of GI bleed, and dementia, presented to the [place] ED on 01/24/18 after sustaining a fall at home.  The patient reports that he was walking down the stairs when he fell and hurt his L hip.  He describes a sharp, stabbing pain.  He reports that ROM exacerbates his symptoms while NWB and immobilization help to alleviate his symptoms.  He was transported to the ED and radiographs of the L hip demonstrated a displaced L femoral neck fracture.  Dr. Wylene Simmer was then consulted.  He denies any injury or surgery to his L hip previously.  The patient reports that he resides at home with his wife and daughter.  He denies any hx of DM, stroke, and is a former smoker.  Past Medical History:  Diagnosis Date  . Basal cell carcinoma (BCC) of face 10/11/2017  . NSTEMI (non-ST elevation myocardial infarction) ([facility]) 01/2016   a.3.0 x 38 Synergy DES to mid RCA 01/2016  . Vertigo     Past Surgical History:  Procedure Laterality Date  . CARDIAC CATHETERIZATION N/A 02/17/2016   Procedure: Left Heart Cath and Coronary Angiography;  Surgeon: Jettie Booze, MD;  Location: [facility];  Service: Cardiovascular;  Laterality: N/A;  . CARDIAC CATHETERIZATION N/A 02/17/2016   Procedure: Coronary Stent Intervention;  Surgeon: Jettie Booze, MD;  Location: [facility];  Service: Cardiovascular;  Laterality: N/A;  . HERNIA REPAIR      History reviewed. No pertinent family history.  Social History:  reports that he has quit smoking. He has never used smokeless tobacco. He reports that he does not drink alcohol or use drugs.  Allergies:  Allergies  Allergen Reactions  . Aspirin Other (See Comments)    Stomach bleeds     Medications: I have reviewed the patient's current medications.  Results for orders  placed or performed during the hospital encounter of 01/24/18 (from the past 48 hour(s))  CBC WITH DIFFERENTIAL     Status: Abnormal   Collection Time: 01/24/18  3:35 PM  Result Value Ref Range   WBC 13.9 (H) 4.0 - 10.5 K/uL   RBC 3.63 (L) 4.22 - 5.81 MIL/uL   Hemoglobin 11.6 (L) 13.0 - 17.0 g/dL   HCT 34.5 (L) 39.0 - 52.0 %   MCV 95.0 78.0 - 100.0 fL   MCH 32.0 26.0 - 34.0 pg   MCHC 33.6 30.0 - 36.0 g/dL   RDW 13.5 11.5 - 15.5 %   Platelets 186 150 - 400 K/uL   Neutrophils Relative % 68 %   Neutro Abs 9.5 (H) 1.7 - 7.7 K/uL   Lymphocytes Relative 19 %   Lymphs Abs 2.6 0.7 - 4.0 K/uL   Monocytes Relative 11 %   Monocytes Absolute 1.5 (H) 0.1 - 1.0 K/uL   Eosinophils Relative 2 %   Eosinophils Absolute 0.3 0.0 - 0.7 K/uL   Basophils Relative 0 %   Basophils Absolute 0.0 0.0 - 0.1 K/uL    Comment: Performed at [facility], [place] [address], [place],  [address]  Type and screen [place]     Status: None (Preliminary result)   Collection Time: 01/24/18  3:35 PM  Result Value Ref Range   ABO/RH(D) O POS    Antibody Screen PENDING  Sample Expiration      01/27/2018 Performed at [facility], [place] [address] [place], [place] [address]     Dg Elbow 2 Views Left  Result Date: 01/24/2018 CLINICAL DATA:  [place] today, diffuse LEFT elbow pain with laceration, LEFT hip pain EXAM: LEFT ELBOW - 2 VIEW COMPARISON:  None FINDINGS: Osseous demineralization. Joint spaces preserved. No acute fracture, dislocation, or bone destruction. No elbow joint effusion. Dorsal soft tissue swelling overlying olecranon. IMPRESSION: No acute osseous abnormalities. Electronically Signed   By: Lavonia Dana M.D.   On: 01/24/2018 14:58   Ct Head Wo Contrast  Result Date: 01/24/2018 CLINICAL DATA:  Status post fall.  Head trauma.  On anticoagulation. EXAM: CT HEAD WITHOUT CONTRAST TECHNIQUE: Contiguous axial images were obtained from the base of the  skull through the vertex without intravenous contrast. COMPARISON:  Brain MRI dated 03/06/2013. FINDINGS: Brain: Confluent areas of low attenuation are again seen throughout the bilateral periventricular and subcortical white matter regions, consistent with chronic small vessel ischemic changes, not significantly changed in extent compared to earlier brain MRI. There is no mass, hemorrhage, edema or other evidence of acute parenchymal abnormality. No extra-axial hemorrhage. Vascular: There are chronic calcified atherosclerotic changes of the large vessels at the skull base. No unexpected hyperdense vessel. Skull: Normal. Negative for fracture or focal lesion. Sinuses/Orbits: No acute finding. Other: None. IMPRESSION: 1. No acute findings. No intracranial hemorrhage or edema. No skull fracture. 2. Chronic small vessel ischemic changes within the white matter. Electronically Signed   By: Franki Cabot M.D.   On: 01/24/2018 15:46   Dg Hip Unilat With Pelvis 2-3 Views Left  Result Date: 01/24/2018 CLINICAL DATA:  Fall today with left hip pain.  Initial encounter. EXAM: DG HIP (WITH OR WITHOUT PELVIS) 2-3V LEFT COMPARISON:  None. FINDINGS: Left femoral neck fracture with displacement and varus angulation. No notable hip degeneration. Osteopenia and atherosclerosis. Remote left groin hernia repair. IMPRESSION: Displaced left femoral neck fracture. Electronically Signed   By: Monte Fantasia M.D.   On: 01/24/2018 14:59    ROS:   Gen: Denies fever, chills, weight change, fatigue, night sweats MSK: C/o L hip pain Neuro: Denies headache, numbness, weakness, slurred speech, loss of memory or consciousness Derm: Denies rash, dry skin, scaling or peeling skin change HEENT: Denies blurred vision, double vision, neck stiffness, dysphagia PULM: Denies shortness of breath, cough, sputum production, hemoptysis, wheezing CV: Denies chest pain, edema, orthopnea, palpitations GI: Denies abdominal pain, nausea, vomiting,  diarrhea, hematochezia, melena, constipation  Endocrine: Denies hot or cold intolerance, polyuria, polyphagia or appetite change Heme: Denies easy bruising, bleeding, bleeding gums  PE:  General: WDWN patient in NAD. Psych:  Appropriate mood and affect. Neuro:  A&O x 3, Moving all extremities, sensation intact to light touch HEENT:  EOMs intact Chest:  Even non-labored respirations Skin:  Incision C/D/I, no rashes or lesions Extremities: warm/dry, mild edema, no erythema or echymosis.  No lymphadenopathy. Pulses: Popliteus 2+ MSK:  ROM: full L ankle ROM, HF to 15 degrees with pain, MMT: able to perform quad set, (+) log roll  Blood pressure 139/70, pulse 68, temperature 98 F (36.7 C), temperature source Oral, resp. rate 16, SpO2 94 %.   Assessment/Plan: Displaced L femoral neck fx  Patient to be admitted to hospitalist service. NWB L LE Plan for L THR by Dr. Rod Can on Thursday 01/26/18. Hold blood thinners.   Mechele Claude PA-C [facility] Office:  [phone]

## 2018-01-24 NOTE — ED Notes (Signed)
ED TO INPATIENT HANDOFF REPORT  Name/Age/Gender Daniel Schmitt 84 y.o. male  Code Status Code Status History    Date Active Date Inactive Code Status Order ID Comments User Context   02/17/2016 0519 02/18/2016 1549 Full Code [id number]  Larey Dresser, MD Inpatient      Home/SNF/Other Home  Chief Complaint Fall  Level of Care/Admitting Diagnosis ED Disposition    ED Disposition Condition [place]: [facility] [[id number]]  Level of Care: Med-Surg [16]  Diagnosis: Closed left hip fracture [facility]) [[id number]]  Admitting Physician: Mariel Aloe [phone]  Attending Physician: Mariel Aloe [phone]  Estimated length of stay: past midnight tomorrow  Certification:: I certify this patient will need inpatient services for at least 2 midnights  PT Class (Do Not Modify): Inpatient [101]  PT Acc Code (Do Not Modify): Private [1]       Medical History Past Medical History:  Diagnosis Date  . Basal cell carcinoma (BCC) of face 10/11/2017  . NSTEMI (non-ST elevation myocardial infarction) ([place]) 01/2016   a.3.0 x 38 Synergy DES to mid RCA 01/2016  . Vertigo     Allergies Allergies  Allergen Reactions  . Aspirin Other (See Comments)    Stomach bleeds     IV Location/Drains/Wounds Patient Lines/Drains/Airways Status   Active Line/Drains/Airways    Name:   Placement date:   Placement time:   Site:   Days:   Peripheral IV 01/24/18 Left Hand   01/24/18    1534    Hand   less than 1          Labs/Imaging Results for orders placed or performed during the hospital encounter of 01/24/18 (from the past 48 hour(s))  Basic metabolic panel     Status: Abnormal   Collection Time: 01/24/18  3:35 PM  Result Value Ref Range   Sodium 143 135 - 145 mmol/L   Potassium 3.7 3.5 - 5.1 mmol/L   Chloride 111 101 - 111 mmol/L   CO2 23 22 - 32 mmol/L   Glucose, Bld 112 (H) 65 - 99 mg/dL   BUN 25 (H) 6 - 20 mg/dL   Creatinine, Ser 1.73 (H) 0.61 - 1.24  mg/dL   Calcium 9.4 8.9 - 10.3 mg/dL   GFR calc non Af Amer 33 (L) >60 mL/min   GFR calc Af Amer 39 (L) >60 mL/min    Comment: (NOTE) The eGFR has been calculated using the CKD EPI equation. This calculation has not been validated in all clinical situations. eGFR's persistently <60 mL/min signify possible Chronic Kidney Disease.    Anion gap 9 5 - 15    Comment: Performed at [facility], [place] [address], [place], [place] [address]  CBC WITH DIFFERENTIAL     Status: Abnormal   Collection Time: 01/24/18  3:35 PM  Result Value Ref Range   WBC 13.9 (H) 4.0 - 10.5 K/uL   RBC 3.63 (L) 4.22 - 5.81 MIL/uL   Hemoglobin 11.6 (L) 13.0 - 17.0 g/dL   HCT 34.5 (L) 39.0 - 52.0 %   MCV 95.0 78.0 - 100.0 fL   MCH 32.0 26.0 - 34.0 pg   MCHC 33.6 30.0 - 36.0 g/dL   RDW 13.5 11.5 - 15.5 %   Platelets 186 150 - 400 K/uL   Neutrophils Relative % 68 %   Neutro Abs 9.5 (H) 1.7 - 7.7 K/uL   Lymphocytes Relative 19 %   Lymphs Abs 2.6  0.7 - 4.0 K/uL   Monocytes Relative 11 %   Monocytes Absolute 1.5 (H) 0.1 - 1.0 K/uL   Eosinophils Relative 2 %   Eosinophils Absolute 0.3 0.0 - 0.7 K/uL   Basophils Relative 0 %   Basophils Absolute 0.0 0.0 - 0.1 K/uL    Comment: Performed at [facility], [place] [address] [place], [place] [address]  Protime-INR     Status: None   Collection Time: 01/24/18  3:35 PM  Result Value Ref Range   Prothrombin Time 13.4 11.4 - 15.2 seconds   INR 1.03     Comment: Performed at [facility], [place] [address] [place], [place] [address]  Type and screen [place]     Status: None   Collection Time: 01/24/18  3:35 PM  Result Value Ref Range   ABO/RH(D) O POS    Antibody Screen NEG    Sample Expiration      01/27/2018 Performed at [facility], [place] [address], [place], [place] [address]    Dg Elbow 2 Views Left  Result Date: 01/24/2018 CLINICAL DATA:  [place] today, diffuse LEFT  elbow pain with laceration, LEFT hip pain EXAM: LEFT ELBOW - 2 VIEW COMPARISON:  None FINDINGS: Osseous demineralization. Joint spaces preserved. No acute fracture, dislocation, or bone destruction. No elbow joint effusion. Dorsal soft tissue swelling overlying olecranon. IMPRESSION: No acute osseous abnormalities. Electronically Signed   By: Lavonia Dana M.D.   On: 01/24/2018 14:58   Ct Head Wo Contrast  Result Date: 01/24/2018 CLINICAL DATA:  Status post fall.  Head trauma.  On anticoagulation. EXAM: CT HEAD WITHOUT CONTRAST TECHNIQUE: Contiguous axial images were obtained from the base of the skull through the vertex without intravenous contrast. COMPARISON:  Brain MRI dated 03/06/2013. FINDINGS: Brain: Confluent areas of low attenuation are again seen throughout the bilateral periventricular and subcortical white matter regions, consistent with chronic small vessel ischemic changes, not significantly changed in extent compared to earlier brain MRI. There is no mass, hemorrhage, edema or other evidence of acute parenchymal abnormality. No extra-axial hemorrhage. Vascular: There are chronic calcified atherosclerotic changes of the large vessels at the skull base. No unexpected hyperdense vessel. Skull: Normal. Negative for fracture or focal lesion. Sinuses/Orbits: No acute finding. Other: None. IMPRESSION: 1. No acute findings. No intracranial hemorrhage or edema. No skull fracture. 2. Chronic small vessel ischemic changes within the white matter. Electronically Signed   By: Franki Cabot M.D.   On: 01/24/2018 15:46   Dg Hip Unilat With Pelvis 2-3 Views Left  Result Date: 01/24/2018 CLINICAL DATA:  Fall today with left hip pain.  Initial encounter. EXAM: DG HIP (WITH OR WITHOUT PELVIS) 2-3V LEFT COMPARISON:  None. FINDINGS: Left femoral neck fracture with displacement and varus angulation. No notable hip degeneration. Osteopenia and atherosclerosis. Remote left groin hernia repair. IMPRESSION: Displaced left  femoral neck fracture. Electronically Signed   By: Monte Fantasia M.D.   On: 01/24/2018 14:59    Pending Labs Unresulted Labs (From admission, onward)   Start     Ordered   01/24/18 1535  ABO/Rh  Once,   R     01/24/18 1535   Signed and Held  CBC  Tomorrow morning,   R     Signed and Held   Signed and Held  Basic metabolic panel  Tomorrow morning,   R     Signed and Held      Vitals/Pain Today's Vitals  01/24/18 1417 01/24/18 1600 01/24/18 1611 01/24/18 1700  BP: 139/70 130/66  122/60  Pulse: 68 (!) 57  64  Resp: 16 14  19   Temp: 98 F (36.7 C)     TempSrc: Oral     SpO2: 94% 95%  93%  PainSc:   9      Isolation Precautions No active isolations  Medications Medications  HYDROmorphone (DILAUDID) injection 0.5 mg (0.5 mg Intravenous Given 01/24/18 1618)  ondansetron (ZOFRAN) injection 4 mg (4 mg Intravenous Given 01/24/18 1530)  Tdap (BOOSTRIX) injection 0.5 mL (0.5 mLs Intramuscular Given 01/24/18 1532)    Mobility walks

## 2018-01-24 NOTE — Progress Notes (Signed)
Report received from ED nurse.

## 2018-01-24 NOTE — ED Notes (Signed)
Report called. Called for transport.

## 2018-01-24 NOTE — ED Triage Notes (Signed)
Patient arrived via [facility] from home. Patient was walking down his stairs and missed the last step causing him to fall. Patient c/o of left hip pain. No obvious deformities, shortening, or rotation. Skin tear to left elbow. Patient is on a blood thinner. Patient denies LOC or hitting his head. Patient glasses were "wonky" and all bent up.

## 2018-01-24 NOTE — Consult Note (Addendum)
Reason for Consult: Left femoral neck fx  Referring Physician: Dr. Sherwood Gambler   HPI: Daniel Schmitt is an 84 y.o. male, with PMH significant for his of myocardial infarction, CKD, hx of GI bleed, and dementia, presented to the [facility] ED on 01/24/18 after sustaining a fall at home.  The patient reports that he was walking down the stairs when he fell and hurt his L hip.  He describes a sharp, stabbing pain.  He reports that ROM exacerbates his symptoms while NWB and immobilization help to alleviate his symptoms.  He was transported to the ED and radiographs of the L hip demonstrated a displaced L femoral neck fracture.  Dr. Wylene Simmer was then consulted.  He denies any injury or surgery to his L hip previously.  The patient reports that he resides at home with his wife and daughter.  He denies any hx of DM, stroke, and is a former smoker.  Past Medical History:  Diagnosis Date  . Basal cell carcinoma (BCC) of face 10/11/2017  . NSTEMI (non-ST elevation myocardial infarction) ([place]) 01/2016   a.3.0 x 38 Synergy DES to mid RCA 01/2016  . Vertigo     Past Surgical History:  Procedure Laterality Date  . CARDIAC CATHETERIZATION N/A 02/17/2016   Procedure: Left Heart Cath and Coronary Angiography;  Surgeon: Jettie Booze, MD;  Location: [facility];  Service: Cardiovascular;  Laterality: N/A;  . CARDIAC CATHETERIZATION N/A 02/17/2016   Procedure: Coronary Stent Intervention;  Surgeon: Jettie Booze, MD;  Location: [facility];  Service: Cardiovascular;  Laterality: N/A;  . HERNIA REPAIR      History reviewed. No pertinent family history.  Social History:  reports that he has quit smoking. He has never used smokeless tobacco. He reports that he does not drink alcohol or use drugs.  Allergies:  Allergies  Allergen Reactions  . Aspirin Other (See Comments)    Stomach bleeds     Medications: I have reviewed the patient's current medications.  Results for orders  placed or performed during the hospital encounter of 01/24/18 (from the past 48 hour(s))  CBC WITH DIFFERENTIAL     Status: Abnormal   Collection Time: 01/24/18  3:35 PM  Result Value Ref Range   WBC 13.9 (H) 4.0 - 10.5 K/uL   RBC 3.63 (L) 4.22 - 5.81 MIL/uL   Hemoglobin 11.6 (L) 13.0 - 17.0 g/dL   HCT 34.5 (L) 39.0 - 52.0 %   MCV 95.0 78.0 - 100.0 fL   MCH 32.0 26.0 - 34.0 pg   MCHC 33.6 30.0 - 36.0 g/dL   RDW 13.5 11.5 - 15.5 %   Platelets 186 150 - 400 K/uL   Neutrophils Relative % 68 %   Neutro Abs 9.5 (H) 1.7 - 7.7 K/uL   Lymphocytes Relative 19 %   Lymphs Abs 2.6 0.7 - 4.0 K/uL   Monocytes Relative 11 %   Monocytes Absolute 1.5 (H) 0.1 - 1.0 K/uL   Eosinophils Relative 2 %   Eosinophils Absolute 0.3 0.0 - 0.7 K/uL   Basophils Relative 0 %   Basophils Absolute 0.0 0.0 - 0.1 K/uL    Comment: Performed at [facility], [place] [address] [place], [place] [address]  Type and screen [place]     Status: None (Preliminary result)   Collection Time: 01/24/18  3:35 PM  Result Value Ref Range   ABO/RH(D) O POS    Antibody Screen PENDING  Sample Expiration      01/27/2018 Performed at [facility], [place] [address], [place], [place] [address]     Dg Elbow 2 Views Left  Result Date: 01/24/2018 CLINICAL DATA:  [place] today, diffuse LEFT elbow pain with laceration, LEFT hip pain EXAM: LEFT ELBOW - 2 VIEW COMPARISON:  None FINDINGS: Osseous demineralization. Joint spaces preserved. No acute fracture, dislocation, or bone destruction. No elbow joint effusion. Dorsal soft tissue swelling overlying olecranon. IMPRESSION: No acute osseous abnormalities. Electronically Signed   By: Lavonia Dana M.D.   On: 01/24/2018 14:58   Ct Head Wo Contrast  Result Date: 01/24/2018 CLINICAL DATA:  Status post fall.  Head trauma.  On anticoagulation. EXAM: CT HEAD WITHOUT CONTRAST TECHNIQUE: Contiguous axial images were obtained from the base of the  skull through the vertex without intravenous contrast. COMPARISON:  Brain MRI dated 03/06/2013. FINDINGS: Brain: Confluent areas of low attenuation are again seen throughout the bilateral periventricular and subcortical white matter regions, consistent with chronic small vessel ischemic changes, not significantly changed in extent compared to earlier brain MRI. There is no mass, hemorrhage, edema or other evidence of acute parenchymal abnormality. No extra-axial hemorrhage. Vascular: There are chronic calcified atherosclerotic changes of the large vessels at the skull base. No unexpected hyperdense vessel. Skull: Normal. Negative for fracture or focal lesion. Sinuses/Orbits: No acute finding. Other: None. IMPRESSION: 1. No acute findings. No intracranial hemorrhage or edema. No skull fracture. 2. Chronic small vessel ischemic changes within the white matter. Electronically Signed   By: Franki Cabot M.D.   On: 01/24/2018 15:46   Dg Hip Unilat With Pelvis 2-3 Views Left  Result Date: 01/24/2018 CLINICAL DATA:  Fall today with left hip pain.  Initial encounter. EXAM: DG HIP (WITH OR WITHOUT PELVIS) 2-3V LEFT COMPARISON:  None. FINDINGS: Left femoral neck fracture with displacement and varus angulation. No notable hip degeneration. Osteopenia and atherosclerosis. Remote left groin hernia repair. IMPRESSION: Displaced left femoral neck fracture. Electronically Signed   By: Monte Fantasia M.D.   On: 01/24/2018 14:59    ROS:   Gen: Denies fever, chills, weight change, fatigue, night sweats MSK: C/o L hip pain Neuro: Denies headache, numbness, weakness, slurred speech, loss of memory or consciousness Derm: Denies rash, dry skin, scaling or peeling skin change HEENT: Denies blurred vision, double vision, neck stiffness, dysphagia PULM: Denies shortness of breath, cough, sputum production, hemoptysis, wheezing CV: Denies chest pain, edema, orthopnea, palpitations GI: Denies abdominal pain, nausea, vomiting,  diarrhea, hematochezia, melena, constipation  Endocrine: Denies hot or cold intolerance, polyuria, polyphagia or appetite change Heme: Denies easy bruising, bleeding, bleeding gums  PE:  General: WDWN patient in NAD. Psych:  Appropriate mood and affect. Neuro:  A&O x 3, Moving all extremities, sensation intact to light touch HEENT:  EOMs intact Chest:  Even non-labored respirations Skin:  Incision C/D/I, no rashes or lesions Extremities: warm/dry, mild edema, no erythema or echymosis.  No lymphadenopathy. Pulses: Popliteus 2+ MSK:  ROM: full L ankle ROM, HF to 15 degrees with pain, MMT: able to perform quad set, (+) log roll  Blood pressure 139/70, pulse 68, temperature 98 F (36.7 C), temperature source Oral, resp. rate 16, SpO2 94 %.   Assessment/Plan: Displaced L femoral neck fx  Patient to be admitted to hospitalist service. NWB L LE Plan for L THR by Dr. Rod Can on Thursday 01/26/18. Hold blood thinners.   Mechele Claude PA-C [facility] Office:  [phone]

## 2018-01-24 NOTE — ED Notes (Signed)
Bed: WA24 Expected date:  Expected time:  Means of arrival:  Comments: No bed 

## 2018-01-24 NOTE — ED Notes (Signed)
Bed: [facility] Expected date:  Expected time:  Means of arrival:  Comments: EMS-fall/skin tear

## 2018-01-24 NOTE — ED Provider Notes (Signed)
[facility] DEPT Provider Note   CSN: [id number] Arrival date & time: 01/24/18  1407     History   Chief Complaint Chief Complaint  Patient presents with  . Fall    HPI Daniel Schmitt is a 84 y.o. male.  HPI  84 year old male with a history of a prior end STEMI, chronic kidney disease, and who is on Plavix presents after a fall.  He states he missed a step when walking down the steps and fell.  He thinks he laid on his left side because he is having left lateral hip pain and mild left elbow pain.  The hip pain is about a 10/10.  He does not think he hit his head but is not quite sure.  He denies any headache but apparently his glasses were busted.  Patient 14 no weakness or numbness.  He is unsure of last tetanus immunization.  Past Medical History:  Diagnosis Date  . Basal cell carcinoma (BCC) of face 10/11/2017  . NSTEMI (non-ST elevation myocardial infarction) ([place]) 01/2016   a.3.0 x 38 Synergy DES to mid RCA 01/2016  . Vertigo     Patient Active Problem List   Diagnosis Date Noted  . Basal cell carcinoma (BCC) of face 10/11/2017  . Dementia 07/05/2016  . CKD (chronic kidney disease) 02/26/2016  . History of GI bleed 02/26/2016  . NSTEMI (non-ST elevated myocardial infarction) ([place]) 02/17/2016    Past Surgical History:  Procedure Laterality Date  . CARDIAC CATHETERIZATION N/A 02/17/2016   Procedure: Left Heart Cath and Coronary Angiography;  Surgeon: Jettie Booze, MD;  Location: [facility];  Service: Cardiovascular;  Laterality: N/A;  . CARDIAC CATHETERIZATION N/A 02/17/2016   Procedure: Coronary Stent Intervention;  Surgeon: Jettie Booze, MD;  Location: [facility];  Service: Cardiovascular;  Laterality: N/A;  . HERNIA REPAIR          Home Medications    Prior to Admission medications   Medication Sig Start Date End Date Taking? Authorizing Provider  acetaminophen (TYLENOL) 500 MG tablet Take 1,000 mg by  mouth every 6 (six) hours as needed for pain.   Yes [provider]  amLODipine (NORVASC) 10 MG tablet Take 1 tablet (10 mg total) by mouth daily. 07/05/16  Yes Imogene Burn, PA-C  aspirin EC 81 MG EC tablet Take 1 tablet (81 mg total) by mouth daily. 02/18/16  Yes Arbutus Leas, NP  atorvastatin (LIPITOR) 80 MG tablet Take 1 tablet (80 mg total) by mouth daily at 6 PM. 12/28/16  Yes Josue Hector, MD  carvedilol (COREG) 12.5 MG tablet TAKE 1 TABLET (12.5 MG TOTAL) BY MOUTH 2 (TWO) TIMES DAILY WITH A MEAL. Patient taking differently: Take 25 mg by mouth 2 (two) times daily with a meal.  10/11/17  Yes Josue Hector, MD  clopidogrel (PLAVIX) 75 MG tablet TAKE 1 TABLET BY MOUTH EVERY DAY 11/15/17  Yes Josue Hector, MD  donepezil (ARICEPT) 5 MG tablet Take 5 mg by mouth at bedtime.   Yes [provider]  levothyroxine (SYNTHROID, LEVOTHROID) 25 MCG tablet Take 25 mcg by mouth daily before breakfast.   Yes [provider]  nitroGLYCERIN (NITROSTAT) 0.4 MG SL tablet Place 1 tablet (0.4 mg total) under the tongue every 5 (five) minutes x 3 doses as needed for chest pain. 11/07/17   Josue Hector, MD    Family History History reviewed. No pertinent family history.  Social History  Social History   Tobacco Use  . Smoking status: Former Research scientist (life sciences)  . Smokeless tobacco: Never Used  . Tobacco comment: QUIT SMOKING IN THE LATE 60'S  Substance Use Topics  . Alcohol use: No  . Drug use: No     Allergies   Aspirin   Review of Systems Review of Systems  Musculoskeletal: Positive for arthralgias.  Skin: Positive for wound.  Neurological: Negative for weakness, numbness and headaches.  All other systems reviewed and are negative.    Physical Exam Updated Vital Signs BP 130/66   Pulse (!) 57   Temp 98 F (36.7 C) (Oral)   Resp 14   SpO2 95%   Physical Exam  Constitutional: He is oriented to person, place, and time. He appears well-developed and  well-nourished.  HENT:  Head: Normocephalic and atraumatic.  Right Ear: External ear normal.  Left Ear: External ear normal.  Nose: Nose normal.  No obvious head trauma  Eyes: Right eye exhibits no discharge. Left eye exhibits no discharge.  Neck: Neck supple.  Cardiovascular: Normal rate, regular rhythm and normal heart sounds.  Pulses:      Radial pulses are 2+ on the left side.       Dorsalis pedis pulses are 2+ on the left side.  Pulmonary/Chest: Effort normal and breath sounds normal.  Abdominal: Soft. He exhibits no distension. There is no tenderness.  Musculoskeletal: He exhibits no edema.       Left elbow: He exhibits normal range of motion and no swelling. No tenderness found.       Left hip: He exhibits tenderness. He exhibits normal range of motion.       Left knee: No tenderness found.       Arms:      Left upper leg: He exhibits no tenderness.       Left lower leg: He exhibits no tenderness.       Left foot: There is no tenderness.  Neurological: He is alert and oriented to person, place, and time.  Skin: Skin is warm and dry.  Nursing note and vitals reviewed.    ED Treatments / Results  Labs (all labs ordered are listed, but only abnormal results are displayed) Labs Reviewed  BASIC METABOLIC PANEL - Abnormal; Notable for the following components:      Result Value   Glucose, Bld 112 (*)    BUN 25 (*)    Creatinine, Ser 1.73 (*)    GFR calc non Af Amer 33 (*)    GFR calc Af Amer 39 (*)    All other components within normal limits  CBC WITH DIFFERENTIAL/PLATELET - Abnormal; Notable for the following components:   WBC 13.9 (*)    RBC 3.63 (*)    Hemoglobin 11.6 (*)    HCT 34.5 (*)    Neutro Abs 9.5 (*)    Monocytes Absolute 1.5 (*)    All other components within normal limits  PROTIME-INR  TYPE AND SCREEN  ABO/RH    EKG EKG Interpretation  Date/Time:  Tuesday January 24 2018 15:52:39 EDT Ventricular Rate:  62 PR Interval:    QRS Duration: 85 QT  Interval:  451 QTC Calculation: 458 R Axis:   -36 Text Interpretation:  Sinus rhythm Left axis deviation Low voltage, precordial leads RSR' in V1 or V2, probably normal variant Nonspecific T abnormalities, diffuse leads T wave changes similar to June 2017 Confirmed by Sherwood Gambler [phone]) on 01/24/2018 4:05:05 PM   Radiology Dg Elbow 2  Views Left  Result Date: 01/24/2018 CLINICAL DATA:  [place] today, diffuse LEFT elbow pain with laceration, LEFT hip pain EXAM: LEFT ELBOW - 2 VIEW COMPARISON:  None FINDINGS: Osseous demineralization. Joint spaces preserved. No acute fracture, dislocation, or bone destruction. No elbow joint effusion. Dorsal soft tissue swelling overlying olecranon. IMPRESSION: No acute osseous abnormalities. Electronically Signed   By: Lavonia Dana M.D.   On: 01/24/2018 14:58   Ct Head Wo Contrast  Result Date: 01/24/2018 CLINICAL DATA:  Status post fall.  Head trauma.  On anticoagulation. EXAM: CT HEAD WITHOUT CONTRAST TECHNIQUE: Contiguous axial images were obtained from the base of the skull through the vertex without intravenous contrast. COMPARISON:  Brain MRI dated 03/06/2013. FINDINGS: Brain: Confluent areas of low attenuation are again seen throughout the bilateral periventricular and subcortical white matter regions, consistent with chronic small vessel ischemic changes, not significantly changed in extent compared to earlier brain MRI. There is no mass, hemorrhage, edema or other evidence of acute parenchymal abnormality. No extra-axial hemorrhage. Vascular: There are chronic calcified atherosclerotic changes of the large vessels at the skull base. No unexpected hyperdense vessel. Skull: Normal. Negative for fracture or focal lesion. Sinuses/Orbits: No acute finding. Other: None. IMPRESSION: 1. No acute findings. No intracranial hemorrhage or edema. No skull fracture. 2. Chronic small vessel ischemic changes within the white matter. Electronically Signed   By: Franki Cabot M.D.    On: 01/24/2018 15:46   Dg Hip Unilat With Pelvis 2-3 Views Left  Result Date: 01/24/2018 CLINICAL DATA:  Fall today with left hip pain.  Initial encounter. EXAM: DG HIP (WITH OR WITHOUT PELVIS) 2-3V LEFT COMPARISON:  None. FINDINGS: Left femoral neck fracture with displacement and varus angulation. No notable hip degeneration. Osteopenia and atherosclerosis. Remote left groin hernia repair. IMPRESSION: Displaced left femoral neck fracture. Electronically Signed   By: Monte Fantasia M.D.   On: 01/24/2018 14:59    Procedures Procedures (including critical care time)  Medications Ordered in ED Medications  HYDROmorphone (DILAUDID) injection 0.5 mg (0.5 mg Intravenous Given 01/24/18 1618)  ondansetron (ZOFRAN) injection 4 mg (4 mg Intravenous Given 01/24/18 1530)  Tdap (BOOSTRIX) injection 0.5 mL (0.5 mLs Intramuscular Given 01/24/18 1532)     Initial Impression / Assessment and Plan / ED Course  I have reviewed the triage vital signs and the nursing notes.  Pertinent labs & imaging results that were available during my care of the patient were reviewed by me and considered in my medical decision making (see chart for details).     This appears to have been a mechanical fall.  Given that he did seem to have injury near his head with his glasses breaking, CT head obtained as he is on Plavix.  This is unremarkable.  He is neurovascular intact but does have a left femoral neck fracture.  I discussed with Dr. Doran Durand who asked for the patient to be admitted to the hospitalist service but surgery will not be tonight and asked for Plavix to be held.  Discussed with Dr. Lonny Prude, who will admit.  Given IV Dilaudid for pain.  Final Clinical Impressions(s) / ED Diagnoses   Final diagnoses:  Left displaced femoral neck fracture (HCC)  Skin tear of left elbow without complication, initial encounter    ED Discharge Orders    None       Sherwood Gambler, MD 01/24/18 1655

## 2018-01-24 NOTE — H&P (Signed)
History and Physical    Daniel Schmitt ZDG:[id number] DOB: 05/20/1926 DOA: 01/24/2018  PCP: [name], MD Patient coming from: Home  Chief Complaint: Left hip pain  HPI: Daniel Schmitt is a 84 y.o. male with medical history significant of dementia, NSTEMI status post stent placement, basal cell carcinoma.  Walking down the stairs in his home.  He reports that he thought he was on the ground floor, but had one more step than he realized and fell.  He was unable to get up at that point.  He states that he crawled to the phone and called his daughter who then called 911.  He reports not taking any medication help with his pain which was significant at that time.   ED Course: Vitals: Afebrile, normal pulse, normal respirations, normotensive, on room air Labs: Creatinine of 1.73, WBC of 30.9, hemoglobin of 11.6 Imaging: CT head unremarkable for acute findings and significant for chronic small vessel ischemic changes.  Hip x-ray significant for displaced left femoral neck fracture.  Elbow x-ray significant for no fracture. Medications/Course: Moderate, Zofran, Tdap given in the emergency department.  Review of Systems: Review of Systems  Musculoskeletal: Positive for joint pain.  All other systems reviewed and are negative.   Past Medical History:  Diagnosis Date  . Basal cell carcinoma (BCC) of face 10/11/2017  . NSTEMI (non-ST elevation myocardial infarction) ([place]) 01/2016   a.3.0 x 38 Synergy DES to mid RCA 01/2016  . Vertigo     Past Surgical History:  Procedure Laterality Date  . CARDIAC CATHETERIZATION N/A 02/17/2016   Procedure: Left Heart Cath and Coronary Angiography;  Surgeon: Jettie Booze, MD;  Location: [place] [facility];  Service: Cardiovascular;  Laterality: N/A;  . CARDIAC CATHETERIZATION N/A 02/17/2016   Procedure: Coronary Stent Intervention;  Surgeon: Jettie Booze, MD;  Location: [place] [facility];  Service: Cardiovascular;  Laterality: N/A;  . HERNIA  REPAIR       reports that he has quit smoking. He has never used smokeless tobacco. He reports that he does not drink alcohol or use drugs.  Allergies  Allergen Reactions  . Aspirin Other (See Comments)    Stomach bleeds     History reviewed. No pertinent family history.  Prior to Admission medications   Medication Sig Start Date End Date Taking? Authorizing Provider  acetaminophen (TYLENOL) 500 MG tablet Take 1,000 mg by mouth every 6 (six) hours as needed for pain.   Yes [provider]  amLODipine (NORVASC) 10 MG tablet Take 1 tablet (10 mg total) by mouth daily. 07/05/16  Yes Imogene Burn, PA-C  aspirin EC 81 MG EC tablet Take 1 tablet (81 mg total) by mouth daily. 02/18/16  Yes Arbutus Leas, NP  atorvastatin (LIPITOR) 80 MG tablet Take 1 tablet (80 mg total) by mouth daily at 6 PM. 12/28/16  Yes Josue Hector, MD  carvedilol (COREG) 12.5 MG tablet TAKE 1 TABLET (12.5 MG TOTAL) BY MOUTH 2 (TWO) TIMES DAILY WITH A MEAL. Patient taking differently: Take 25 mg by mouth 2 (two) times daily with a meal.  10/11/17  Yes Josue Hector, MD  clopidogrel (PLAVIX) 75 MG tablet TAKE 1 TABLET BY MOUTH EVERY DAY 11/15/17  Yes Josue Hector, MD  donepezil (ARICEPT) 5 MG tablet Take 5 mg by mouth at bedtime.   Yes [provider]  levothyroxine (SYNTHROID, LEVOTHROID) 25 MCG tablet Take 25 mcg by mouth daily before breakfast.   Yes [provider]  nitroGLYCERIN (NITROSTAT) 0.4 MG SL tablet Place 1 tablet (0.4 mg total) under the tongue every 5 (five) minutes x 3 doses as needed for chest pain. 11/07/17   Josue Hector, MD    Physical Exam:  Physical Exam  Constitutional: He appears well-developed and well-nourished. No distress.  HENT:  Mouth/Throat: Oropharynx is clear and moist.  Eyes: Pupils are equal, round, and reactive to light. Conjunctivae and EOM are normal.  Neck: Normal range of motion.  Cardiovascular: Normal rate, regular rhythm and normal heart  sounds.  No murmur heard. Pulmonary/Chest: Effort normal and breath sounds normal. No respiratory distress. He has no wheezes. He has no rales.  Abdominal: Soft. Bowel sounds are normal. He exhibits no distension. There is no tenderness. There is no rebound and no guarding.  Musculoskeletal: He exhibits no edema.       Left hip: He exhibits decreased range of motion.  Lymphadenopathy:    He has no cervical adenopathy.  Neurological: He is alert. He is disoriented.  Skin: Skin is warm and dry. Lesion (right side of face near temple) noted. He is not diaphoretic.  Psychiatric: He has a normal mood and affect. His speech is normal and behavior is normal. Thought content normal. Cognition and memory are impaired.     Labs on Admission: I have personally reviewed following labs and imaging studies  CBC: Recent Labs  Lab 01/24/18 1535  WBC 13.9*  NEUTROABS 9.5*  HGB 11.6*  HCT 34.5*  MCV 95.0  PLT 950    Basic Metabolic Panel: Recent Labs  Lab 01/24/18 1535  NA 143  K 3.7  CL 111  CO2 23  GLUCOSE 112*  BUN 25*  CREATININE 1.73*  CALCIUM 9.4    GFR: CrCl cannot be calculated (Unknown ideal weight.).  Liver Function Tests: No results for input(s): AST, ALT, ALKPHOS, BILITOT, PROT, ALBUMIN in the last 168 hours. No results for input(s): LIPASE, AMYLASE in the last 168 hours. No results for input(s): AMMONIA in the last 168 hours.  Coagulation Profile: Recent Labs  Lab 01/24/18 1535  INR 1.03    Cardiac Enzymes: No results for input(s): CKTOTAL, CKMB, CKMBINDEX, TROPONINI in the last 168 hours.  BNP (last 3 results) No results for input(s): PROBNP in the last 8760 hours.  HbA1C: No results for input(s): HGBA1C in the last 72 hours.  CBG: No results for input(s): GLUCAP in the last 168 hours.  Lipid Profile: No results for input(s): CHOL, HDL, LDLCALC, TRIG, CHOLHDL, LDLDIRECT in the last 72 hours.  Thyroid Function Tests: No results for input(s): TSH,  T4TOTAL, FREET4, T3FREE, THYROIDAB in the last 72 hours.  Anemia Panel: No results for input(s): VITAMINB12, FOLATE, FERRITIN, TIBC, IRON, RETICCTPCT in the last 72 hours.  Urine analysis:    Component Value Date/Time   COLORURINE YELLOW 02/16/2016 2058   APPEARANCEUR CLEAR 02/16/2016 2058   LABSPEC 1.025 02/16/2016 2058   PHURINE 5.0 02/16/2016 2058   GLUCOSEU NEGATIVE 02/16/2016 2058   HGBUR NEGATIVE 02/16/2016 2058   BILIRUBINUR NEGATIVE 02/16/2016 2058   KETONESUR NEGATIVE 02/16/2016 2058   PROTEINUR NEGATIVE 02/16/2016 2058   UROBILINOGEN 0.2 02/11/2013 1353   NITRITE NEGATIVE 02/16/2016 2058   LEUKOCYTESUR NEGATIVE 02/16/2016 2058     Radiological Exams on Admission: Dg Elbow 2 Views Left  Result Date: 01/24/2018 CLINICAL DATA:  [place] today, diffuse LEFT elbow pain with laceration, LEFT hip pain EXAM: LEFT ELBOW - 2 VIEW COMPARISON:  None FINDINGS: Osseous demineralization. Joint spaces  preserved. No acute fracture, dislocation, or bone destruction. No elbow joint effusion. Dorsal soft tissue swelling overlying olecranon. IMPRESSION: No acute osseous abnormalities. Electronically Signed   By: Lavonia Dana M.D.   On: 01/24/2018 14:58   Ct Head Wo Contrast  Result Date: 01/24/2018 CLINICAL DATA:  Status post fall.  Head trauma.  On anticoagulation. EXAM: CT HEAD WITHOUT CONTRAST TECHNIQUE: Contiguous axial images were obtained from the base of the skull through the vertex without intravenous contrast. COMPARISON:  Brain MRI dated 03/06/2013. FINDINGS: Brain: Confluent areas of low attenuation are again seen throughout the bilateral periventricular and subcortical white matter regions, consistent with chronic small vessel ischemic changes, not significantly changed in extent compared to earlier brain MRI. There is no mass, hemorrhage, edema or other evidence of acute parenchymal abnormality. No extra-axial hemorrhage. Vascular: There are chronic calcified atherosclerotic changes of the  large vessels at the skull base. No unexpected hyperdense vessel. Skull: Normal. Negative for fracture or focal lesion. Sinuses/Orbits: No acute finding. Other: None. IMPRESSION: 1. No acute findings. No intracranial hemorrhage or edema. No skull fracture. 2. Chronic small vessel ischemic changes within the white matter. Electronically Signed   By: Franki Cabot M.D.   On: 01/24/2018 15:46   Dg Hip Unilat With Pelvis 2-3 Views Left  Result Date: 01/24/2018 CLINICAL DATA:  Fall today with left hip pain.  Initial encounter. EXAM: DG HIP (WITH OR WITHOUT PELVIS) 2-3V LEFT COMPARISON:  None. FINDINGS: Left femoral neck fracture with displacement and varus angulation. No notable hip degeneration. Osteopenia and atherosclerosis. Remote left groin hernia repair. IMPRESSION: Displaced left femoral neck fracture. Electronically Signed   By: Monte Fantasia M.D.   On: 01/24/2018 14:59    EKG: Independently reviewed. Sinus rhythm. T-wave inversion in leads II, III, V3-6 and T-wave flattening in leads I, V1  Assessment/Plan Active Problems:   NSTEMI (non-ST elevated myocardial infarction) (HCC)   Dementia   Basal cell carcinoma (BCC) of face   Closed left hip fracture (HCC)    Left hip fracture Surgery planned for 6/6 -Orthopedic surgery recommendations -Pain management -CM/CSW consult  CKD stage III Baseline 1.6-1.7. Stable.  Basal cell carcinoma Status post radiation therapy. Outpatient follow-up  Dementia -Continue Aricept  History of NSTEMI Status post drug-eluting stent in 2017.  Currently on aspirin and Plavix as an outpatient.  No chest pain. -Holding aspirin and Plavix secondary to upcoming surgery   DVT prophylaxis: SCDs Code Status: Full code Family Communication: Wife and daughter Disposition Plan: Plan for left hip surgery Consults called: Orthopedic surgery by EDP Admission status: Inpatient, medical floor   Cordelia Poche, MD [facility]  If 7PM-7AM, please  contact night-coverage [email or web address] Password [facility]  01/24/2018, 5:24 PM

## 2018-01-25 DIAGNOSIS — S72002A Fracture of unspecified part of neck of left femur, initial encounter for closed fracture: Secondary | ICD-10-CM

## 2018-01-25 LAB — BASIC METABOLIC PANEL
Anion gap: 7 (ref 5–15)
BUN: 22 mg/dL — AB (ref 6–20)
CALCIUM: 8.5 mg/dL — AB (ref 8.9–10.3)
CO2: 26 mmol/L (ref 22–32)
CREATININE: 1.74 mg/dL — AB (ref 0.61–1.24)
Chloride: 107 mmol/L (ref 101–111)
GFR calc non Af Amer: 33 mL/min — ABNORMAL LOW (ref >60)
GFR, EST AFRICAN AMERICAN: 38 mL/min — AB (ref >60)
Glucose, Bld: 99 mg/dL (ref 65–99)
Potassium: 3.2 mmol/L — ABNORMAL LOW (ref 3.5–5.1)
SODIUM: 140 mmol/L (ref 135–145)

## 2018-01-25 LAB — SURGICAL PCR SCREEN
MRSA, PCR: NEGATIVE
Staphylococcus aureus: POSITIVE — AB

## 2018-01-25 LAB — CBC
HCT: 30.5 % — ABNORMAL LOW (ref 39.0–52.0)
HEMOGLOBIN: 10.1 g/dL — AB (ref 13.0–17.0)
MCH: 31.3 pg (ref 26.0–34.0)
MCHC: 33.1 g/dL (ref 30.0–36.0)
MCV: 94.4 fL (ref 78.0–100.0)
Platelets: 158 10*3/uL (ref 150–400)
RBC: 3.23 MIL/uL — ABNORMAL LOW (ref 4.22–5.81)
RDW: 13.8 % (ref 11.5–15.5)
WBC: 11.9 10*3/uL — ABNORMAL HIGH (ref 4.0–10.5)

## 2018-01-25 MED ORDER — SODIUM CHLORIDE 0.9 % IV SOLN
INTRAVENOUS | Status: DC
  Administered 2018-01-25 – 2018-01-26 (×3): via INTRAVENOUS

## 2018-01-25 MED ORDER — MUPIROCIN 2 % EX OINT
1.0000 "application " | TOPICAL_OINTMENT | Freq: Two times a day (BID) | CUTANEOUS | Status: AC
  Administered 2018-01-25 – 2018-01-26 (×5): 1 via NASAL
  Filled 2018-01-25: qty 22

## 2018-01-25 MED ORDER — ALUM & MAG HYDROXIDE-SIMETH 200-200-20 MG/5ML PO SUSP: 30.0000 mL | Freq: Four times a day (QID) | ORAL | Status: DC | PRN

## 2018-01-25 MED ORDER — CALCIUM CARBONATE ANTACID 500 MG PO CHEW
1.0000 | CHEWABLE_TABLET | Freq: Three times a day (TID) | ORAL | Status: DC | PRN
  Administered 2018-01-27: 200 mg via ORAL
  Filled 2018-01-25: qty 1

## 2018-01-25 MED ORDER — TRANEXAMIC ACID 1000 MG/10ML IV SOLN
1000.0000 mg | INTRAVENOUS | Status: AC
  Administered 2018-01-26: 1000 mg via INTRAVENOUS
  Filled 2018-01-25: qty 1100

## 2018-01-25 NOTE — Progress Notes (Signed)
PROGRESS NOTE    Daniel Schmitt  JOI:[id number] DOB: 1926-07-19 DOA: 01/24/2018 PCP: [name], MD    Brief Narrative:  84 y.o. male with medical history significant of dementia, NSTEMI status post stent placement, basal cell carcinoma. Pt fell and had difficulty getting up. Hip x ray in ED would report desplaced left femoral neck fx.  Assessment & Plan:   Active Problems:   Closed left hip fracture ([place]) - operation planned for 6/6 - npo after midnight - supportive therapy    Dementia -stable    Basal cell carcinoma (BCC) of face -stable     NSTEMI (non-ST elevated myocardial infarction) ([place]) - Status post drug-eluting stent in 2017.  Currently on aspirin and Plavix as an outpatient.  No chest pain. -Holding aspirin and Plavix secondary to upcoming surgery   DVT prophylaxis: SCD's Code Status: Full Family Communication: none at bedside. Disposition Plan: pending PT recommendations after operation   Consultants:   Orthopaedics   Procedures: pending   Antimicrobials: none   Subjective: Pt has no new complaints.  Objective: Vitals:   01/25/18 0830 01/25/18 0836 01/25/18 1010 01/25/18 1314  BP:  (!) 119/52 129/61 116/63  Pulse: (!) 57 (!) 59 65 68  Resp:  14 16 14   Temp:  98.9 F (37.2 C) 98.5 F (36.9 C) 98.1 F (36.7 C)  TempSrc:  Oral Oral Oral  SpO2:  94% 95% 96%  Weight:      Height:        Intake/Output Summary (Last 24 hours) at 01/25/2018 1345 Last data filed at 01/25/2018 1318 Gross per 24 hour  Intake 1830 ml  Output 475 ml  Net 1355 ml   Filed Weights   01/24/18 2000  Weight: 60.2 kg (132 lb 11.5 oz)    Examination:  General exam: Appears calm and comfortable, in nad. Respiratory system: Clear to auscultation. Respiratory effort normal. Equal chest rise. Cardiovascular system: S1 & S2 heard, RRR. No JVD, murmurs, rubs, gallops or clicks. No pedal edema. Gastrointestinal system: Abdomen is nondistended, soft and nontender. No  organomegaly or masses felt. Normal bowel sounds heard. Central nervous system: Alert and oriented. No focal neurological deficits. Extremities: warm, + pulses Skin: No rashes, lesions or ulcers on limited exam. Psychiatry:  Mood & affect appropriate.     Data Reviewed: I have personally reviewed following labs and imaging studies  CBC: Recent Labs  Lab 01/24/18 1535 01/25/18 0521  WBC 13.9* 11.9*  NEUTROABS 9.5*  --   HGB 11.6* 10.1*  HCT 34.5* 30.5*  MCV 95.0 94.4  PLT 186 470   Basic Metabolic Panel: Recent Labs  Lab 01/24/18 1535 01/25/18 0521  NA 143 140  K 3.7 3.2*  CL 111 107  CO2 23 26  GLUCOSE 112* 99  BUN 25* 22*  CREATININE 1.73* 1.74*  CALCIUM 9.4 8.5*   GFR: Estimated Creatinine Clearance: 23.2 mL/min (A) (by C-G formula based on SCr of 1.74 mg/dL (H)). Liver Function Tests: No results for input(s): AST, ALT, ALKPHOS, BILITOT, PROT, ALBUMIN in the last 168 hours. No results for input(s): LIPASE, AMYLASE in the last 168 hours. No results for input(s): AMMONIA in the last 168 hours. Coagulation Profile: Recent Labs  Lab 01/24/18 1535  INR 1.03   Cardiac Enzymes: No results for input(s): CKTOTAL, CKMB, CKMBINDEX, TROPONINI in the last 168 hours. BNP (last 3 results) No results for input(s): PROBNP in the last 8760 hours. HbA1C: No results for input(s): HGBA1C in the last 72 hours. CBG:  No results for input(s): GLUCAP in the last 168 hours. Lipid Profile: No results for input(s): CHOL, HDL, LDLCALC, TRIG, CHOLHDL, LDLDIRECT in the last 72 hours. Thyroid Function Tests: No results for input(s): TSH, T4TOTAL, FREET4, T3FREE, THYROIDAB in the last 72 hours. Anemia Panel: No results for input(s): VITAMINB12, FOLATE, FERRITIN, TIBC, IRON, RETICCTPCT in the last 72 hours. Sepsis Labs: No results for input(s): PROCALCITON, LATICACIDVEN in the last 168 hours.  Recent Results (from the past 240 hour(s))  Surgical PCR screen     Status: Abnormal    Collection Time: 01/25/18  1:33 AM  Result Value Ref Range Status   MRSA, PCR NEGATIVE NEGATIVE Final   Staphylococcus aureus POSITIVE (A) NEGATIVE Final    Comment: (NOTE) The Xpert SA Assay (FDA approved for NASAL specimens in patients 69 years of age and older), is one component of a comprehensive surveillance program. It is not intended to diagnose infection nor to guide or monitor treatment. Performed at [facility], [place] [address] [place], [place] [address]          Radiology Studies: Dg Elbow 2 Views Left  Result Date: 01/24/2018 CLINICAL DATA:  [place] today, diffuse LEFT elbow pain with laceration, LEFT hip pain EXAM: LEFT ELBOW - 2 VIEW COMPARISON:  None FINDINGS: Osseous demineralization. Joint spaces preserved. No acute fracture, dislocation, or bone destruction. No elbow joint effusion. Dorsal soft tissue swelling overlying olecranon. IMPRESSION: No acute osseous abnormalities. Electronically Signed   By: Lavonia Dana M.D.   On: 01/24/2018 14:58   Ct Head Wo Contrast  Result Date: 01/24/2018 CLINICAL DATA:  Status post fall.  Head trauma.  On anticoagulation. EXAM: CT HEAD WITHOUT CONTRAST TECHNIQUE: Contiguous axial images were obtained from the base of the skull through the vertex without intravenous contrast. COMPARISON:  Brain MRI dated 03/06/2013. FINDINGS: Brain: Confluent areas of low attenuation are again seen throughout the bilateral periventricular and subcortical white matter regions, consistent with chronic small vessel ischemic changes, not significantly changed in extent compared to earlier brain MRI. There is no mass, hemorrhage, edema or other evidence of acute parenchymal abnormality. No extra-axial hemorrhage. Vascular: There are chronic calcified atherosclerotic changes of the large vessels at the skull base. No unexpected hyperdense vessel. Skull: Normal. Negative for fracture or focal lesion. Sinuses/Orbits: No acute finding. Other: None.  IMPRESSION: 1. No acute findings. No intracranial hemorrhage or edema. No skull fracture. 2. Chronic small vessel ischemic changes within the white matter. Electronically Signed   By: Franki Cabot M.D.   On: 01/24/2018 15:46   Dg Hip Unilat With Pelvis 2-3 Views Left  Result Date: 01/24/2018 CLINICAL DATA:  Fall today with left hip pain.  Initial encounter. EXAM: DG HIP (WITH OR WITHOUT PELVIS) 2-3V LEFT COMPARISON:  None. FINDINGS: Left femoral neck fracture with displacement and varus angulation. No notable hip degeneration. Osteopenia and atherosclerosis. Remote left groin hernia repair. IMPRESSION: Displaced left femoral neck fracture. Electronically Signed   By: Monte Fantasia M.D.   On: 01/24/2018 14:59    Scheduled Meds: . carvedilol  25 mg Oral BID WC  . docusate sodium  100 mg Oral BID  . donepezil  5 mg Oral QHS  . levothyroxine  25 mcg Oral Q0600  . mupirocin ointment  1 application Nasal BID  . senna  1 tablet Oral BID   Continuous Infusions: . sodium chloride 75 mL/hr at 01/25/18 1338  . [START ON 01/26/2018] tranexamic acid       LOS: 1 day  Time spent: 36 min  Velvet Bathe, MD [facility] Pager [phone]  If 7PM-7AM, please contact night-coverage [email or web address] Password [email or web address] 01/25/2018, 1:45 PM

## 2018-01-25 NOTE — Plan of Care (Signed)
Plan of care discussed.   

## 2018-01-25 NOTE — Progress Notes (Signed)
Initial Nutrition Assessment  DOCUMENTATION CODES:   (Will assess for malnutrition at follow-up)  INTERVENTION:  - Will order Magic Cup once/day with lunch with meals, this supplement provides 290 kcal and 9 grams of protein. - Continue to encourage PO intakes. - Will attempt to obtain nutrition-related information and perform NFPE at follow-up.   NUTRITION DIAGNOSIS:   Inadequate oral intake related to acute illness as evidenced by meal completion < 25%.  GOAL:   Patient will meet greater than or equal to 90% of their needs  MONITOR:   PO intake, Supplement acceptance, Weight trends, Labs  REASON FOR ASSESSMENT:   Consult Hip fracture protocol  ASSESSMENT:   84 y.o. male with medical history significant of dementia, NSTEMI status post stent placement, basal cell carcinoma.  Walking down the stairs in his home.  He reports that he thought he was on the ground floor, but had one more step than he realized and fell.  He was unable to get up at that point.  He states that he crawled to the phone and called his daughter who then called 911.  He reports not taking any medication help with his pain which was significant at that time.   BMI indicates normal weight. No intakes documented since admission. Pt to be NPO starting at midnight tonight for surgery tomorrow 2/2 L hip fx. Patient sleeping at this time with blankets and sheets completely covering his head and the rest of his body. No family/visitors in the room. Breakfast tray was untouched on bedside table: a bowl of frosted flakes and a cup of milk. A bag from [employer] was also on bedside table and appeared to be unopened since being provided to patient.  Per chart review, weight has been stable since January 2019.  Medications reviewed; 25 mcg oral Synthroid/day, 100 mg Colace BID, 1 tablet Senokot BID.  Labs reviewed; K: 3.2 mmol/L, BUN: 22 mg/dL, creatinine: 1.74 mg/dL, Ca: 8.5 mg/dL, GFR: 33 mL/min.       NUTRITION  - FOCUSED PHYSICAL EXAM:  Unable to complete at this time and will attempt at follow-up.   Diet Order:   Diet Order           Diet NPO time specified  Diet effective midnight        Diet NPO time specified Except for: Sips with Meds  Diet effective midnight        Diet Heart Room service appropriate? Yes; Fluid consistency: Thin  Diet effective now          EDUCATION NEEDS:   No education needs have been identified at this time  Skin:  Skin Assessment: Reviewed RN Assessment  Last BM:  6/3  Height:   Ht Readings from Last 1 Encounters:  01/24/18 5\' 3"  (1.6 m)    Weight:   Wt Readings from Last 1 Encounters:  01/24/18 132 lb 11.5 oz (60.2 kg)    Ideal Body Weight:  56.36 kg  BMI:  Body mass index is 23.51 kg/m.  Estimated Nutritional Needs:   Kcal:  1829-9371 (25-28 kcal/kg)  Protein:  60-70 grams  Fluid:  >/= 1.5 L/day      Jarome Matin, MS, RD, LDN, [facility] Inpatient Clinical Dietitian Pager # [phone] After hours/weekend pager # [phone]

## 2018-01-26 ENCOUNTER — Inpatient Hospital Stay (HOSPITAL_COMMUNITY): Payer: Medicare Other

## 2018-01-26 ENCOUNTER — Encounter (HOSPITAL_COMMUNITY)
Admission: EM | Disposition: A | Discharge status: Skilled Nursing Facility | Payer: Self-pay | Source: Home / Self Care | Attending: Family Medicine

## 2018-01-26 ENCOUNTER — Encounter (HOSPITAL_COMMUNITY): Payer: Self-pay | Admitting: Anesthesiology

## 2018-01-26 ENCOUNTER — Inpatient Hospital Stay (HOSPITAL_COMMUNITY): Payer: Medicare Other | Admitting: Anesthesiology

## 2018-01-26 DIAGNOSIS — S72002A Fracture of unspecified part of neck of left femur, initial encounter for closed fracture: Secondary | ICD-10-CM | POA: Diagnosis present

## 2018-01-26 HISTORY — PX: TOTAL HIP ARTHROPLASTY: SHX124

## 2018-01-26 LAB — CREATININE, SERUM
Creatinine, Ser: 1.56 mg/dL — ABNORMAL HIGH (ref 0.61–1.24)
GFR calc non Af Amer: 38 mL/min — ABNORMAL LOW (ref >60)
GFR, EST AFRICAN AMERICAN: 44 mL/min — AB (ref >60)

## 2018-01-26 LAB — CBC
HEMATOCRIT: 31 % — AB (ref 39.0–52.0)
Hemoglobin: 10.3 g/dL — ABNORMAL LOW (ref 13.0–17.0)
MCH: 31.9 pg (ref 26.0–34.0)
MCHC: 33.2 g/dL (ref 30.0–36.0)
MCV: 96 fL (ref 78.0–100.0)
PLATELETS: 133 10*3/uL — AB (ref 150–400)
RBC: 3.23 MIL/uL — ABNORMAL LOW (ref 4.22–5.81)
RDW: 13.5 % (ref 11.5–15.5)
WBC: 12.8 10*3/uL — AB (ref 4.0–10.5)

## 2018-01-26 SURGERY — ARTHROPLASTY, HIP, TOTAL, ANTERIOR APPROACH
Anesthesia: General | Site: Hip | Laterality: Left

## 2018-01-26 MED ORDER — SODIUM CHLORIDE 0.9 % IJ SOLN
INTRAMUSCULAR | Status: DC | PRN
  Administered 2018-01-26: 30 mL

## 2018-01-26 MED ORDER — SUGAMMADEX SODIUM 200 MG/2ML IV SOLN
INTRAVENOUS | Status: AC
  Filled 2018-01-26: qty 4

## 2018-01-26 MED ORDER — BUPIVACAINE-EPINEPHRINE 0.25% -1:200000 IJ SOLN
INTRAMUSCULAR | Status: DC | PRN
  Administered 2018-01-26: 30 mL

## 2018-01-26 MED ORDER — SODIUM CHLORIDE 0.9 % IJ SOLN
INTRAMUSCULAR | Status: AC
  Filled 2018-01-26: qty 50

## 2018-01-26 MED ORDER — SODIUM CHLORIDE 0.9 % IR SOLN
Status: DC | PRN
  Administered 2018-01-26: 4000 mL

## 2018-01-26 MED ORDER — CHLORHEXIDINE GLUCONATE 4 % EX LIQD
60.0000 mL | Freq: Once | CUTANEOUS | Status: AC
  Administered 2018-01-26: 4 via TOPICAL
  Filled 2018-01-26: qty 15

## 2018-01-26 MED ORDER — FENTANYL CITRATE (PF) 100 MCG/2ML IJ SOLN: 25.0000 ug | INTRAMUSCULAR | Status: DC | PRN

## 2018-01-26 MED ORDER — ONDANSETRON HCL 4 MG/2ML IJ SOLN
INTRAMUSCULAR | Status: AC
  Filled 2018-01-26: qty 2

## 2018-01-26 MED ORDER — FENTANYL CITRATE (PF) 100 MCG/2ML IJ SOLN
INTRAMUSCULAR | Status: AC
  Filled 2018-01-26: qty 2

## 2018-01-26 MED ORDER — ROCURONIUM BROMIDE 10 MG/ML (PF) SYRINGE
PREFILLED_SYRINGE | INTRAVENOUS | Status: AC
  Filled 2018-01-26: qty 5

## 2018-01-26 MED ORDER — PHENOL 1.4 % MT LIQD: 1.0000 | OROMUCOSAL | Status: DC | PRN

## 2018-01-26 MED ORDER — METOCLOPRAMIDE HCL 5 MG PO TABS: 5.0000 mg | ORAL_TABLET | Freq: Three times a day (TID) | ORAL | Status: DC | PRN

## 2018-01-26 MED ORDER — SUGAMMADEX SODIUM 200 MG/2ML IV SOLN
INTRAVENOUS | Status: DC | PRN
  Administered 2018-01-26: 200 mg via INTRAVENOUS

## 2018-01-26 MED ORDER — POVIDONE-IODINE 10 % EX SWAB
2.0000 "application " | Freq: Once | CUTANEOUS | Status: AC
  Administered 2018-01-26: 2 via TOPICAL

## 2018-01-26 MED ORDER — ONDANSETRON HCL 4 MG/2ML IJ SOLN: 4.0000 mg | Freq: Once | INTRAMUSCULAR | Status: DC | PRN

## 2018-01-26 MED ORDER — OXYCODONE HCL 5 MG/5ML PO SOLN
5.0000 mg | Freq: Once | ORAL | Status: DC | PRN
  Filled 2018-01-26: qty 5

## 2018-01-26 MED ORDER — PROPOFOL 10 MG/ML IV BOLUS
INTRAVENOUS | Status: DC | PRN
  Administered 2018-01-26: 90 mg via INTRAVENOUS

## 2018-01-26 MED ORDER — DOCUSATE SODIUM 100 MG PO CAPS
100.0000 mg | ORAL_CAPSULE | Freq: Two times a day (BID) | ORAL | Status: DC
  Administered 2018-01-26 – 2018-01-29 (×5): 100 mg via ORAL
  Filled 2018-01-26 (×5): qty 1

## 2018-01-26 MED ORDER — KETOROLAC TROMETHAMINE 30 MG/ML IJ SOLN
INTRAMUSCULAR | Status: DC | PRN
  Administered 2018-01-26: 30 mg via INTRA_ARTICULAR

## 2018-01-26 MED ORDER — ISOPROPYL ALCOHOL 70 % SOLN
Status: DC | PRN
  Administered 2018-01-26: 1 via TOPICAL

## 2018-01-26 MED ORDER — LACTATED RINGERS IV SOLN: INTRAVENOUS | Status: DC

## 2018-01-26 MED ORDER — CHLORHEXIDINE GLUCONATE CLOTH 2 % EX PADS
6.0000 | MEDICATED_PAD | Freq: Every day | CUTANEOUS | Status: DC
  Administered 2018-01-26 – 2018-01-28 (×2): 6 via TOPICAL

## 2018-01-26 MED ORDER — CEFAZOLIN SODIUM-DEXTROSE 2-4 GM/100ML-% IV SOLN
2.0000 g | INTRAVENOUS | Status: AC
  Administered 2018-01-26: 2 g via INTRAVENOUS
  Filled 2018-01-26: qty 100

## 2018-01-26 MED ORDER — LIDOCAINE 2% (20 MG/ML) 5 ML SYRINGE
INTRAMUSCULAR | Status: DC | PRN
  Administered 2018-01-26: 80 mg via INTRAVENOUS

## 2018-01-26 MED ORDER — ENOXAPARIN SODIUM 30 MG/0.3ML ~~LOC~~ SOLN
30.0000 mg | SUBCUTANEOUS | Status: DC
  Administered 2018-01-27 – 2018-01-29 (×3): 30 mg via SUBCUTANEOUS
  Filled 2018-01-26 (×3): qty 0.3

## 2018-01-26 MED ORDER — PROPOFOL 10 MG/ML IV BOLUS
INTRAVENOUS | Status: AC
  Filled 2018-01-26: qty 20

## 2018-01-26 MED ORDER — ONDANSETRON HCL 4 MG/2ML IJ SOLN
INTRAMUSCULAR | Status: AC
  Filled 2018-01-26: qty 4

## 2018-01-26 MED ORDER — ONDANSETRON HCL 4 MG/2ML IJ SOLN: 4.0000 mg | Freq: Four times a day (QID) | INTRAMUSCULAR | Status: DC | PRN

## 2018-01-26 MED ORDER — CEFAZOLIN SODIUM-DEXTROSE 2-4 GM/100ML-% IV SOLN
2.0000 g | Freq: Four times a day (QID) | INTRAVENOUS | Status: AC
  Administered 2018-01-26 – 2018-01-27 (×2): 2 g via INTRAVENOUS
  Filled 2018-01-26 (×2): qty 100

## 2018-01-26 MED ORDER — FENTANYL CITRATE (PF) 100 MCG/2ML IJ SOLN
25.0000 ug | INTRAMUSCULAR | Status: AC | PRN
  Administered 2018-01-26 (×5): 25 ug via INTRAVENOUS

## 2018-01-26 MED ORDER — ONDANSETRON HCL 4 MG PO TABS: 4.0000 mg | ORAL_TABLET | Freq: Four times a day (QID) | ORAL | Status: DC | PRN

## 2018-01-26 MED ORDER — FENTANYL CITRATE (PF) 100 MCG/2ML IJ SOLN
INTRAMUSCULAR | Status: AC
  Administered 2018-01-26: 25 ug via INTRAVENOUS
  Filled 2018-01-26: qty 2

## 2018-01-26 MED ORDER — METOCLOPRAMIDE HCL 5 MG/ML IJ SOLN: 5.0000 mg | Freq: Three times a day (TID) | INTRAMUSCULAR | Status: DC | PRN

## 2018-01-26 MED ORDER — ROCURONIUM BROMIDE 10 MG/ML (PF) SYRINGE
PREFILLED_SYRINGE | INTRAVENOUS | Status: DC | PRN
  Administered 2018-01-26: 40 mg via INTRAVENOUS

## 2018-01-26 MED ORDER — LACTATED RINGERS IV SOLN
INTRAVENOUS | Status: DC
  Administered 2018-01-26: 14:00:00 via INTRAVENOUS

## 2018-01-26 MED ORDER — EPHEDRINE SULFATE 50 MG/ML IJ SOLN
INTRAMUSCULAR | Status: DC | PRN
  Administered 2018-01-26: 5 mg via INTRAVENOUS
  Administered 2018-01-26: 10 mg via INTRAVENOUS

## 2018-01-26 MED ORDER — ONDANSETRON HCL 4 MG/2ML IJ SOLN
INTRAMUSCULAR | Status: DC | PRN
  Administered 2018-01-26: 4 mg via INTRAVENOUS

## 2018-01-26 MED ORDER — ALUM & MAG HYDROXIDE-SIMETH 200-200-20 MG/5ML PO SUSP
30.0000 mL | Freq: Four times a day (QID) | ORAL | Status: DC | PRN
  Administered 2018-01-27: 30 mL via ORAL
  Filled 2018-01-26: qty 30

## 2018-01-26 MED ORDER — DEXAMETHASONE SODIUM PHOSPHATE 10 MG/ML IJ SOLN
INTRAMUSCULAR | Status: DC | PRN
  Administered 2018-01-26: 10 mg via INTRAVENOUS

## 2018-01-26 MED ORDER — OXYCODONE HCL 5 MG PO TABS: 5.0000 mg | ORAL_TABLET | Freq: Once | ORAL | Status: DC | PRN

## 2018-01-26 MED ORDER — LACTATED RINGERS IV SOLN
INTRAVENOUS | Status: DC | PRN
  Administered 2018-01-26: 14:00:00 via INTRAVENOUS

## 2018-01-26 MED ORDER — DEXAMETHASONE SODIUM PHOSPHATE 10 MG/ML IJ SOLN
INTRAMUSCULAR | Status: AC
  Filled 2018-01-26: qty 2

## 2018-01-26 MED ORDER — KETOROLAC TROMETHAMINE 30 MG/ML IJ SOLN
INTRAMUSCULAR | Status: AC
  Filled 2018-01-26: qty 1

## 2018-01-26 MED ORDER — BUPIVACAINE-EPINEPHRINE (PF) 0.25% -1:200000 IJ SOLN
INTRAMUSCULAR | Status: AC
  Filled 2018-01-26: qty 30

## 2018-01-26 MED ORDER — MENTHOL 3 MG MT LOZG: 1.0000 | LOZENGE | OROMUCOSAL | Status: DC | PRN

## 2018-01-26 MED ORDER — ACETAMINOPHEN 10 MG/ML IV SOLN
1000.0000 mg | INTRAVENOUS | Status: AC
  Administered 2018-01-26: 1000 mg via INTRAVENOUS
  Filled 2018-01-26: qty 100

## 2018-01-26 SURGICAL SUPPLY — 45 items
ADH SKN CLS APL DERMABOND .7 (GAUZE/BANDAGES/DRESSINGS) ×1
BAG DECANTER FOR FLEXI CONT (MISCELLANEOUS) IMPLANT
BAG SPEC THK2 15X12 ZIP CLS (MISCELLANEOUS)
BAG ZIPLOCK 12X15 (MISCELLANEOUS) IMPLANT
CAPT HIP HEMI 2 ×1 IMPLANT
CHLORAPREP W/TINT 26ML (MISCELLANEOUS) ×2 IMPLANT
CLOTH BEACON ORANGE TIMEOUT ST (SAFETY) ×2 IMPLANT
COVER PERINEAL POST (MISCELLANEOUS) ×2 IMPLANT
COVER SURGICAL LIGHT HANDLE (MISCELLANEOUS) ×2 IMPLANT
DECANTER SPIKE VIAL GLASS SM (MISCELLANEOUS) ×2 IMPLANT
DERMABOND ADVANCED (GAUZE/BANDAGES/DRESSINGS) ×1
DERMABOND ADVANCED .7 DNX12 (GAUZE/BANDAGES/DRESSINGS) ×2 IMPLANT
DRAPE SHEET LG 3/4 BI-LAMINATE (DRAPES) ×6 IMPLANT
DRAPE STERI IOBAN 125X83 (DRAPES) ×2 IMPLANT
DRAPE U-SHAPE 47X51 STRL (DRAPES) ×4 IMPLANT
DRSG AQUACEL AG ADV 3.5X10 (GAUZE/BANDAGES/DRESSINGS) ×2 IMPLANT
ELECT PENCIL ROCKER SW 15FT (MISCELLANEOUS) ×2 IMPLANT
ELECT REM PT RETURN 15FT ADLT (MISCELLANEOUS) ×2 IMPLANT
GAUZE SPONGE 4X4 12PLY STRL (GAUZE/BANDAGES/DRESSINGS) ×2 IMPLANT
GLOVE BIO SURGEON STRL SZ8.5 (GLOVE) ×12 IMPLANT
GLOVE BIOGEL PI IND STRL 8.5 (GLOVE) ×1 IMPLANT
GLOVE BIOGEL PI INDICATOR 8.5 (GLOVE) ×1
GOWN SPEC L3 XXLG W/TWL (GOWN DISPOSABLE) ×5 IMPLANT
HANDPIECE INTERPULSE COAX TIP (DISPOSABLE) ×2
HOLDER FOLEY CATH W/STRAP (MISCELLANEOUS) ×2 IMPLANT
HOOD PEEL AWAY FLYTE STAYCOOL (MISCELLANEOUS) ×8 IMPLANT
MARKER SKIN DUAL TIP RULER LAB (MISCELLANEOUS) ×2 IMPLANT
NDL SPNL 18GX3.5 QUINCKE PK (NEEDLE) ×1 IMPLANT
NEEDLE SPNL 18GX3.5 QUINCKE PK (NEEDLE) ×2 IMPLANT
PACK ANTERIOR HIP CUSTOM (KITS) ×2 IMPLANT
SAW OSC TIP CART 19.5X105X1.3 (SAW) ×2 IMPLANT
SEALER BIPOLAR AQUA 6.0 (INSTRUMENTS) ×2 IMPLANT
SET HNDPC FAN SPRY TIP SCT (DISPOSABLE) ×1 IMPLANT
SUT ETHIBOND NAB CT1 #1 30IN (SUTURE) ×4 IMPLANT
SUT MNCRL AB 3-0 PS2 18 (SUTURE) ×2 IMPLANT
SUT MON AB 2-0 CT1 36 (SUTURE) ×4 IMPLANT
SUT STRATAFIX PDO 1 14 VIOLET (SUTURE) ×2
SUT STRATFX PDO 1 14 VIOLET (SUTURE) ×1
SUT VIC AB 2-0 CT1 27 (SUTURE) ×2
SUT VIC AB 2-0 CT1 TAPERPNT 27 (SUTURE) ×1 IMPLANT
SUTURE STRATFX PDO 1 14 VIOLET (SUTURE) ×1 IMPLANT
SYR 50ML LL SCALE MARK (SYRINGE) ×2 IMPLANT
TRAY FOLEY MTR SLVR 16FR STAT (SET/KITS/TRAYS/PACK) ×1 IMPLANT
WATER STERILE IRR 1000ML POUR (IV SOLUTION) ×2 IMPLANT
YANKAUER SUCT BULB TIP 10FT TU (MISCELLANEOUS) ×2 IMPLANT

## 2018-01-26 NOTE — Op Note (Signed)
OPERATIVE REPORT  SURGEON: Rod Can, MD   ASSISTANT: Sherlean Foot, RNFA.  PREOPERATIVE DIAGNOSIS: Displaced Left femoral neck fracture.   POSTOPERATIVE DIAGNOSIS: Displaced Left femoral neck fracture.   PROCEDURE: Left hip hemiarthroplasty, anterior approach.   IMPLANTS: DePuy Tri Lock stem, size 4, std offset, with a +0 mm spacer and a 49 mm monopolar head ball.  ANESTHESIA:  General  ANTIBIOTICS: 2g ancef.  ESTIMATED BLOOD LOSS:-100 mL    DRAINS: None.  COMPLICATIONS: None   CONDITION: PACU - hemodynamically stable.   BRIEF CLINICAL NOTE: Daniel Schmitt is a 84 y.o. male with a displaced Left femoral neck fracture. The patient was admitted to the hospitalist service and underwent perioperative risk stratification and medical optimization. The risks, benefits, and alternatives to hemiarthroplasty were explained, and the patient elected to proceed.  PROCEDURE IN DETAIL: The patient was taken to the operating room and general anesthesia was induced on the hospital bed. The patient was then positioned on the Hana table. All bony prominences were well padded. The hip was prepped and draped in the normal sterile surgical fashion. A time-out was called verifying side and site of surgery. Antibiotics were given within 60 minutes of beginning the procedure.  The direct anterior approach to the hip was performed through the Hueter interval. Lateral femoral circumflex vessels were treated with the Auqumantys. The anterior capsule was exposed and an inverted T capsulotomy was made. Fracture hematoma was encountered and evacuated. The patient was found to have a comminuted Left subcapital femoral neck fracture. I freshened the femoral neck cut with a saw. I removed the femoral neck fragment. A corkscrew was placed into the head and the head was removed. This was passed to the back table and was measured.  Acetabular exposure was achieved. I examined the articular  cartilage which was intact. The labrum was intact. A 49 mm trial head was placed and found to have excellent fit.  I then gained femoral exposure taking care to protect the abductors and greater trochanter. This was performed using standard external rotation, extension, and adduction. The capsule was peeled off the inner aspect of the greater trochanter, taking care to preserve the short external rotators. A cookie cutter was used to enter the femoral canal, and then the femoral canal finder was used to confirm location. I then sequentially broached up to a size 4. Calcar planer was used on the femoral neck remnant. I paced a std neck and a 36 + 1.5 head ball.The hip was reduced. Leg lengths were checked fluoroscopically. The hip was dislocated and trial components were removed. I placed the real stem followed by the real spacer and head ball. A single reduction maneuver was performed and the hip was reduced. Fluoroscopy was used to confirm component position and leg lengths. At 90 degrees of external rotation and extension, the hip was stable to an anterior directed force.  The wound was copiously irrigated with normal saline solution. Marcaine solution was injected into the periarticular soft tissue. The wound was closed in layers using #1 Vicryl and V-Loc for the fascia, 2-0 Vicryl for the subcutaneous fat, 2-0 Monocryl for the deep dermal layer, 3-0 running Monocryl subcuticular stitch and glue for the skin. Once the glue was fully dried, an Aquacell Ag dressing was applied. The patient was then awakened from anesthesia and transported to the recovery room in stable condition. Sponge, needle, and instrument counts were correct at the end of the case x2. The patient tolerated the procedure well and there  were no known complications.

## 2018-01-26 NOTE — Anesthesia Procedure Notes (Signed)
Procedure Name: Intubation Date/Time: 01/26/2018 4:02 PM Performed by: Lavina Hamman, CRNA Pre-anesthesia Checklist: Patient identified, Emergency Drugs available, Suction available, Patient being monitored and Timeout performed Patient Re-evaluated:Patient Re-evaluated prior to induction Oxygen Delivery Method: Circle system utilized Preoxygenation: Pre-oxygenation with 100% oxygen Induction Type: IV induction Ventilation: Mask ventilation without difficulty Laryngoscope Size: Mac and 4 Grade View: Grade I Tube type: Oral Tube size: 7.5 mm Number of attempts: 1 Airway Equipment and Method: Stylet Placement Confirmation: ETT inserted through vocal cords under direct vision,  positive ETCO2,  CO2 detector and breath sounds checked- equal and bilateral Secured at: 22 cm Tube secured with: Tape Dental Injury: Teeth and Oropharynx as per pre-operative assessment  Comments: Redness on right vocal cord.  Left vocal cord has small polyp at lower aspect.  ATOI.

## 2018-01-26 NOTE — Progress Notes (Signed)
Report given to Gaynelle Cage, RN who is now getting pt ready for surgery.

## 2018-01-26 NOTE — Anesthesia Preprocedure Evaluation (Addendum)
Anesthesia Evaluation  Patient identified by MRN, date of birth, ID band Patient awake    Reviewed: Allergy & Precautions, NPO status , Patient's Chart, lab work & pertinent test results  Airway Mallampati: II  TM Distance: >3 FB Neck ROM: Full    Dental  (+) Dental Advisory Given   Pulmonary former smoker,    breath sounds clear to auscultation       Cardiovascular + Past MI and + Cardiac Stents   Rhythm:Regular Rate:Normal  '17 TTE - EF 55-65%. Grade 1 diastolic dysfunction, trivial MR.  '17 Cath - Mid RCA lesion, 95% stenosed. Post intervention with a 3.0 x 38 Synergy DES postdilated to 3.5 mm, there is a 0% residual stenosis. Mildly increased LVEDP. Continuing aggressive hydration to prevent contrast-induced nephropathy.   Neuro/Psych Dementia Vertigo    GI/Hepatic negative GI ROS, Neg liver ROS,   Endo/Other  negative endocrine ROS  Renal/GU Renal InsufficiencyRenal disease  negative genitourinary   Musculoskeletal negative musculoskeletal ROS (+)   Abdominal   Peds  Hematology  (+) anemia ,   Anesthesia Other Findings Basal cell carcinoma  Reproductive/Obstetrics                           Anesthesia Physical Anesthesia Plan  ASA: III  Anesthesia Plan: General   Post-op Pain Management:    Induction: Intravenous  PONV Risk Score and Plan: 3 and Treatment may vary due to age or medical condition, Ondansetron and Propofol infusion  Airway Management Planned: Oral ETT  Additional Equipment: None  Intra-op Plan:   Post-operative Plan: Extubation in OR  Informed Consent: I have reviewed the patients History and Physical, chart, labs and discussed the procedure including the risks, benefits and alternatives for the proposed anesthesia with the patient or authorized representative who has indicated his/her understanding and acceptance.   Dental advisory given  Plan Discussed  with: CRNA and Anesthesiologist  Anesthesia Plan Comments:       Anesthesia Quick Evaluation

## 2018-01-26 NOTE — Discharge Instructions (Signed)
°Dr. Ashima Shrake °Joint Replacement Specialist °[facility] °[address] °[place], [place] [address] °[phone] ° ° °TOTAL HIP REPLACEMENT POSTOPERATIVE DIRECTIONS ° ° ° °Hip Rehabilitation, Guidelines Following Surgery  ° °WEIGHT BEARING °Weight bearing as tolerated with assist device (walker, cane, etc) as directed, use it as long as suggested by your surgeon or therapist, typically at least 4-6 weeks. ° °The results of a hip operation are greatly improved after range of motion and muscle strengthening exercises. Follow all safety measures which are given to protect your hip. If any of these exercises cause increased pain or swelling in your joint, decrease the amount until you are comfortable again. Then slowly increase the exercises. Call your caregiver if you have problems or questions.  ° °HOME CARE INSTRUCTIONS  °Most of the following instructions are designed to prevent the dislocation of your new hip.  °Remove items at home which could result in a fall. This includes throw rugs or furniture in walking pathways.  °Continue medications as instructed at time of discharge. °· You may have some home medications which will be placed on hold until you complete the course of blood thinner medication. °· You may start showering once you are discharged home. Do not remove your dressing. °Do not put on socks or shoes without following the instructions of your caregivers.   °Sit on chairs with arms. Use the chair arms to help push yourself up when arising.  °Arrange for the use of a toilet seat elevator so you are not sitting low.  °· Walk with walker as instructed.  °You may resume a sexual relationship in one month or when given the OK by your caregiver.  °Use walker as long as suggested by your caregivers.  °You may put full weight on your legs and walk as much as is comfortable. °Avoid periods of inactivity such as sitting longer than an hour when not asleep. This helps prevent  blood clots.  °You may return to work once you are cleared by your surgeon.  °Do not drive a car for 6 weeks or until released by your surgeon.  °Do not drive while taking narcotics.  °Wear elastic stockings for two weeks following surgery during the day but you may remove then at night.  °Make sure you keep all of your appointments after your operation with all of your doctors and caregivers. You should call the office at the above phone number and make an appointment for approximately two weeks after the date of your surgery. °Please pick up a stool softener and laxative for home use as long as you are requiring pain medications. °· ICE to the affected hip every three hours for 30 minutes at a time and then as needed for pain and swelling. Continue to use ice on the hip for pain and swelling from surgery. You may notice swelling that will progress down to the foot and ankle.  This is normal after surgery.  Elevate the leg when you are not up walking on it.   °It is important for you to complete the blood thinner medication as prescribed by your doctor. °· Continue to use the breathing machine which will help keep your temperature down.  It is common for your temperature to cycle up and down following surgery, especially at night when you are not up moving around and exerting yourself.  The breathing machine keeps your lungs expanded and your temperature down. ° °RANGE OF MOTION AND STRENGTHENING EXERCISES  °These exercises are   designed to help you keep full movement of your hip joint. Follow your caregiver's or physical therapist's instructions. Perform all exercises about fifteen times, three times per day or as directed. Exercise both hips, even if you have had only one joint replacement. These exercises can be done on a training (exercise) mat, on the floor, on a table or on a bed. Use whatever works the best and is most comfortable for you. Use music or television while you are exercising so that the exercises  are a pleasant break in your day. This will make your life better with the exercises acting as a break in routine you can look forward to.  °Lying on your back, slowly slide your foot toward your buttocks, raising your knee up off the floor. Then slowly slide your foot back down until your leg is straight again.  °Lying on your back spread your legs as far apart as you can without causing discomfort.  °Lying on your side, raise your upper leg and foot straight up from the floor as far as is comfortable. Slowly lower the leg and repeat.  °Lying on your back, tighten up the muscle in the front of your thigh (quadriceps muscles). You can do this by keeping your leg straight and trying to raise your heel off the floor. This helps strengthen the largest muscle supporting your knee.  °Lying on your back, tighten up the muscles of your buttocks both with the legs straight and with the knee bent at a comfortable angle while keeping your heel on the floor.  ° °SKILLED REHAB INSTRUCTIONS: °If the patient is transferred to a skilled rehab facility following release from the hospital, a list of the current medications will be sent to the facility for the patient to continue.  When discharged from the skilled rehab facility, please have the facility set up the patient's Home Health Physical Therapy prior to being released. Also, the skilled facility will be responsible for providing the patient with their medications at time of release from the facility to include their pain medication and their blood thinner medication. If the patient is still at the rehab facility at time of the two week follow up appointment, the skilled rehab facility will also need to assist the patient in arranging follow up appointment in our office and any transportation needs. ° °MAKE SURE YOU:  °Understand these instructions.  °Will watch your condition.  °Will get help right away if you are not doing well or get worse. ° °Pick up stool softner and  laxative for home use following surgery while on pain medications. °Do not remove your dressing. °The dressing is waterproof--it is OK to take showers. °Continue to use ice for pain and swelling after surgery. °Do not use any lotions or creams on the incision until instructed by your surgeon. °Total Hip Protocol. ° ° °

## 2018-01-26 NOTE — Interval H&P Note (Signed)
History and Physical Interval Note:  01/26/2018 3:28 PM  Daniel Schmitt  has presented today for surgery, with the diagnosis of displaced left femoral neck fracture  The various methods of treatment have been discussed with the patient and family. After consideration of risks, benefits and other options for treatment, the patient has consented to  Procedure(s) with comments: LEFT TOTAL HIP ARTHROPLASTY ANTERIOR APPROACH (Left) - Needs RNFA as a surgical intervention .  The patient's history has been reviewed, patient examined, no change in status, stable for surgery.  I have reviewed the patient's chart and labs.  Questions were answered to the patient's satisfaction.    The risks, benefits, and alternatives were discussed with the patient. There are risks associated with the surgery including, but not limited to, problems with anesthesia (death), infection, instability (giving out of the joint), dislocation, differences in leg length/angulation/rotation, fracture of bones, loosening or failure of implants, hematoma (blood accumulation) which may require surgical drainage, blood clots, pulmonary embolism, nerve injury (foot drop and lateral thigh numbness), and blood vessel injury. The patient understands these risks and elects to proceed.   Daniel Schmitt

## 2018-01-26 NOTE — Progress Notes (Signed)
PROGRESS NOTE    Daniel Schmitt  OJJ:[id number] DOB: Oct 14, 1925 DOA: 01/24/2018 PCP: [name], MD    Brief Narrative:  84 y.o. male with medical history significant of dementia, NSTEMI status post stent placement, basal cell carcinoma. Pt fell and had difficulty getting up. Hip x ray in ED would report desplaced left femoral neck fx.  Assessment & Plan:   Active Problems:   Closed left hip fracture ([place]) - operation planned for today - supportive therapy    Dementia -stable    Basal cell carcinoma (BCC) of face -stable     NSTEMI (non-ST elevated myocardial infarction) ([place]) - Status post drug-eluting stent in 2017.  Currently on aspirin and Plavix as an outpatient.  No chest pain. -Holding aspirin and Plavix secondary to upcoming surgery - will resume one safe from surgeon standpoint.   DVT prophylaxis: SCD's Code Status: Full Family Communication: none at bedside. Disposition Plan: pending PT recommendations after operation   Consultants:   Orthopaedics   Procedures: pending   Antimicrobials: none   Subjective: Pt has no new complaints.  Objective: Vitals:   01/26/18 0143 01/26/18 0605 01/26/18 0932 01/26/18 1331  BP: (!) 115/56 123/62 (!) 126/58 (!) 116/57  Pulse: 64 68 66 66  Resp: 16 16 15    Temp: 98.4 F (36.9 C) 99.6 F (37.6 C) 98.2 F (36.8 C) 98.7 F (37.1 C)  TempSrc: Oral Oral Oral Oral  SpO2: 96% 95% 95% 96%  Weight:      Height:        Intake/Output Summary (Last 24 hours) at 01/26/2018 1445 Last data filed at 01/26/2018 1403 Gross per 24 hour  Intake 1345 ml  Output 595 ml  Net 750 ml   Filed Weights   01/24/18 2000  Weight: 60.2 kg (132 lb 11.5 oz)    Examination: Exam unchanged when compared to prior.  General exam: Appears calm and comfortable, in nad. Respiratory system: Clear to auscultation. Respiratory effort normal. Equal chest rise. Cardiovascular system: S1 & S2 heard, RRR. No JVD, murmurs, rubs, gallops or  clicks. No pedal edema. Gastrointestinal system: Abdomen is nondistended, soft and nontender. No organomegaly or masses felt. Normal bowel sounds heard. Central nervous system: Alert and oriented. No focal neurological deficits. Extremities: warm, + pulses Skin: No rashes, lesions or ulcers on limited exam. Psychiatry:  Mood & affect appropriate.     Data Reviewed: I have personally reviewed following labs and imaging studies  CBC: Recent Labs  Lab 01/24/18 1535 01/25/18 0521  WBC 13.9* 11.9*  NEUTROABS 9.5*  --   HGB 11.6* 10.1*  HCT 34.5* 30.5*  MCV 95.0 94.4  PLT 186 937   Basic Metabolic Panel: Recent Labs  Lab 01/24/18 1535 01/25/18 0521  NA 143 140  K 3.7 3.2*  CL 111 107  CO2 23 26  GLUCOSE 112* 99  BUN 25* 22*  CREATININE 1.73* 1.74*  CALCIUM 9.4 8.5*   GFR: Estimated Creatinine Clearance: 23.2 mL/min (A) (by C-G formula based on SCr of 1.74 mg/dL (H)). Liver Function Tests: No results for input(s): AST, ALT, ALKPHOS, BILITOT, PROT, ALBUMIN in the last 168 hours. No results for input(s): LIPASE, AMYLASE in the last 168 hours. No results for input(s): AMMONIA in the last 168 hours. Coagulation Profile: Recent Labs  Lab 01/24/18 1535  INR 1.03   Cardiac Enzymes: No results for input(s): CKTOTAL, CKMB, CKMBINDEX, TROPONINI in the last 168 hours. BNP (last 3 results) No results for input(s): PROBNP in the last 8760  hours. HbA1C: No results for input(s): HGBA1C in the last 72 hours. CBG: No results for input(s): GLUCAP in the last 168 hours. Lipid Profile: No results for input(s): CHOL, HDL, LDLCALC, TRIG, CHOLHDL, LDLDIRECT in the last 72 hours. Thyroid Function Tests: No results for input(s): TSH, T4TOTAL, FREET4, T3FREE, THYROIDAB in the last 72 hours. Anemia Panel: No results for input(s): VITAMINB12, FOLATE, FERRITIN, TIBC, IRON, RETICCTPCT in the last 72 hours. Sepsis Labs: No results for input(s): PROCALCITON, LATICACIDVEN in the last 168  hours.  Recent Results (from the past 240 hour(s))  Surgical PCR screen     Status: Abnormal   Collection Time: 01/25/18  1:33 AM  Result Value Ref Range Status   MRSA, PCR NEGATIVE NEGATIVE Final   Staphylococcus aureus POSITIVE (A) NEGATIVE Final    Comment: (NOTE) The Xpert SA Assay (FDA approved for NASAL specimens in patients 60 years of age and older), is one component of a comprehensive surveillance program. It is not intended to diagnose infection nor to guide or monitor treatment. Performed at [facility], [place] [address] [place], [place] [address]          Radiology Studies: Dg Elbow 2 Views Left  Result Date: 01/24/2018 CLINICAL DATA:  [place] today, diffuse LEFT elbow pain with laceration, LEFT hip pain EXAM: LEFT ELBOW - 2 VIEW COMPARISON:  None FINDINGS: Osseous demineralization. Joint spaces preserved. No acute fracture, dislocation, or bone destruction. No elbow joint effusion. Dorsal soft tissue swelling overlying olecranon. IMPRESSION: No acute osseous abnormalities. Electronically Signed   By: Lavonia Dana M.D.   On: 01/24/2018 14:58   Ct Head Wo Contrast  Result Date: 01/24/2018 CLINICAL DATA:  Status post fall.  Head trauma.  On anticoagulation. EXAM: CT HEAD WITHOUT CONTRAST TECHNIQUE: Contiguous axial images were obtained from the base of the skull through the vertex without intravenous contrast. COMPARISON:  Brain MRI dated 03/06/2013. FINDINGS: Brain: Confluent areas of low attenuation are again seen throughout the bilateral periventricular and subcortical white matter regions, consistent with chronic small vessel ischemic changes, not significantly changed in extent compared to earlier brain MRI. There is no mass, hemorrhage, edema or other evidence of acute parenchymal abnormality. No extra-axial hemorrhage. Vascular: There are chronic calcified atherosclerotic changes of the large vessels at the skull base. No unexpected hyperdense vessel. Skull:  Normal. Negative for fracture or focal lesion. Sinuses/Orbits: No acute finding. Other: None. IMPRESSION: 1. No acute findings. No intracranial hemorrhage or edema. No skull fracture. 2. Chronic small vessel ischemic changes within the white matter. Electronically Signed   By: Franki Cabot M.D.   On: 01/24/2018 15:46   Dg Hip Unilat With Pelvis 2-3 Views Left  Result Date: 01/24/2018 CLINICAL DATA:  Fall today with left hip pain.  Initial encounter. EXAM: DG HIP (WITH OR WITHOUT PELVIS) 2-3V LEFT COMPARISON:  None. FINDINGS: Left femoral neck fracture with displacement and varus angulation. No notable hip degeneration. Osteopenia and atherosclerosis. Remote left groin hernia repair. IMPRESSION: Displaced left femoral neck fracture. Electronically Signed   By: Monte Fantasia M.D.   On: 01/24/2018 14:59    Scheduled Meds: . [MAR Hold] carvedilol  25 mg Oral BID WC  . [MAR Hold] Chlorhexidine Gluconate Cloth  6 each Topical Daily  . [MAR Hold] docusate sodium  100 mg Oral BID  . [MAR Hold] donepezil  5 mg Oral QHS  . [MAR Hold] levothyroxine  25 mcg Oral Q0600  . [MAR Hold] mupirocin ointment  1 application Nasal BID  . [  MAR Hold] senna  1 tablet Oral BID   Continuous Infusions: . sodium chloride Stopped (01/26/18 1420)  . acetaminophen    .  ceFAZolin (ANCEF) IV    . lactated ringers 50 mL/hr at 01/26/18 1421  . tranexamic acid       LOS: 2 days    Time spent: 20 min  Velvet Bathe, MD [facility] Pager [phone]  If 7PM-7AM, please contact night-coverage [email or web address] Password [email or web address] 01/26/2018, 2:45 PM

## 2018-01-26 NOTE — Transfer of Care (Signed)
Immediate Anesthesia Transfer of Care Note  Patient: Daniel Schmitt  Procedure(s) Performed: Procedure(s) with comments: LEFT TOTAL HIP ARTHROPLASTY ANTERIOR APPROACH (Left) - Needs RNFA  Patient Location: PACU  Anesthesia Type:General  Level of Consciousness:  sedated, patient cooperative and responds to stimulation  Airway & Oxygen Therapy:Patient Spontanous Breathing and Patient connected to face mask oxgen  Post-op Assessment:  Report given to PACU RN and Post -op Vital signs reviewed and stable  Post vital signs:  Reviewed and stable  Last Vitals:  Vitals:   01/26/18 1524 01/26/18 1525  BP:  (!) 120/58  Pulse: 61 (!) 59  Resp:    Temp:    SpO2: 26% 37%    Complications: No apparent anesthesia complications

## 2018-01-27 ENCOUNTER — Encounter (HOSPITAL_COMMUNITY): Payer: Self-pay | Admitting: Orthopedic Surgery

## 2018-01-27 LAB — CBC
HEMATOCRIT: 30.4 % — AB (ref 39.0–52.0)
Hemoglobin: 10.4 g/dL — ABNORMAL LOW (ref 13.0–17.0)
MCH: 32.2 pg (ref 26.0–34.0)
MCHC: 34.2 g/dL (ref 30.0–36.0)
MCV: 94.1 fL (ref 78.0–100.0)
PLATELETS: 145 10*3/uL — AB (ref 150–400)
RBC: 3.23 MIL/uL — ABNORMAL LOW (ref 4.22–5.81)
RDW: 13.3 % (ref 11.5–15.5)
WBC: 13.5 10*3/uL — ABNORMAL HIGH (ref 4.0–10.5)

## 2018-01-27 LAB — BASIC METABOLIC PANEL
Anion gap: 10 (ref 5–15)
BUN: 25 mg/dL — AB (ref 6–20)
CO2: 23 mmol/L (ref 22–32)
Calcium: 8.4 mg/dL — ABNORMAL LOW (ref 8.9–10.3)
Chloride: 104 mmol/L (ref 101–111)
Creatinine, Ser: 1.74 mg/dL — ABNORMAL HIGH (ref 0.61–1.24)
GFR calc Af Amer: 38 mL/min — ABNORMAL LOW (ref >60)
GFR, EST NON AFRICAN AMERICAN: 33 mL/min — AB (ref >60)
GLUCOSE: 224 mg/dL — AB (ref 65–99)
POTASSIUM: 3.7 mmol/L (ref 3.5–5.1)
Sodium: 137 mmol/L (ref 135–145)

## 2018-01-27 MED ORDER — HYDROCODONE-ACETAMINOPHEN 5-325 MG PO TABS: 1.0000 | ORAL_TABLET | Freq: Four times a day (QID) | ORAL | 0 refills | Status: DC | PRN

## 2018-01-27 MED ORDER — ENOXAPARIN SODIUM 30 MG/0.3ML ~~LOC~~ SOLN: 30.0000 mg | SUBCUTANEOUS | 0 refills | Status: DC

## 2018-01-27 NOTE — NC FL2 (Signed)
Daniel Schmitt LEVEL OF CARE SCREENING TOOL     IDENTIFICATION  Patient Name: Daniel Schmitt Birthdate: [birth year] Sex: male Admission Date (Current Location): 01/24/2018  [facility] and [place] Number:  Herbalist and Address:  [facility],  [place] [address], [place]      Provider Number: [id number]  Attending Physician Name and Address:  Velvet Bathe, MD  Relative Name and Phone Number:       Current Level of Care: Hospital Recommended Level of Care: [place] Prior Approval Number:    Date Approved/Denied:   PASRR Number: [id number]  Discharge Plan: SNF    Current Diagnoses: Patient Active Problem List   Diagnosis Date Noted  . Closed displaced fracture of left femoral neck ([place]) 01/26/2018  . Closed left hip fracture ([place]) 01/24/2018  . Basal cell carcinoma (BCC) of face 10/11/2017  . Dementia 07/05/2016  . CKD (chronic kidney disease) 02/26/2016  . History of GI bleed 02/26/2016  . NSTEMI (non-ST elevated myocardial infarction) ([place]) 02/17/2016    Orientation RESPIRATION BLADDER Height & Weight     Self  O2 Continent Weight: 132 lb 11.5 oz (60.2 kg) Height:  5\' 3"  (160 cm)  BEHAVIORAL SYMPTOMS/MOOD NEUROLOGICAL BOWEL NUTRITION STATUS      Continent Diet(Left Hip )  AMBULATORY STATUS COMMUNICATION OF NEEDS Skin   Extensive Assist Verbally Normal                       Personal Care Assistance Level of Assistance  Bathing, Feeding, Dressing Bathing Assistance: Limited assistance Feeding assistance: Independent Dressing Assistance: Limited assistance     Functional Limitations Info  Sight, Hearing, Speech Sight Info: Adequate Hearing Info: Adequate Speech Info: Adequate    SPECIAL CARE FACTORS FREQUENCY  PT (By licensed PT)     PT Frequency: 5x/week              Contractures Contractures Info: Not present    Additional Factors Info  Code Status, Allergies, Psychotropic Code  Status Info: fullcode Allergies Info: Allergies: Aspirin           Current Medications (01/27/2018):  This is the current hospital active medication list Current Facility-Administered Medications  Medication Dose Route Frequency Provider Last Rate Last Dose  . 0.9 %  sodium chloride infusion   Intravenous Continuous Rod Can, MD 75 mL/hr at 01/26/18 2215    . alum & mag hydroxide-simeth (MAALOX/MYLANTA) 200-200-20 MG/5ML suspension 30 mL  30 mL Oral Q6H PRN Rod Can, MD   30 mL at 01/27/18 0855  . calcium carbonate (TUMS - dosed in mg elemental calcium) chewable tablet 200 mg of elemental calcium  1 tablet Oral TID PRN Rod Can, MD   200 mg of elemental calcium at 01/27/18 0855  . carvedilol (COREG) tablet 25 mg  25 mg Oral BID WC Rod Can, MD   25 mg at 01/27/18 0813  . Chlorhexidine Gluconate Cloth 2 % PADS 6 each  6 each Topical Daily Rod Can, MD   6 each at 01/26/18 [phone]  . docusate sodium (COLACE) capsule 100 mg  100 mg Oral BID Rod Can, MD   100 mg at 01/27/18 1045  . donepezil (ARICEPT) tablet 5 mg  5 mg Oral QHS Rod Can, MD   5 mg at 01/26/18 2140  . enoxaparin (LOVENOX) injection 30 mg  30 mg Subcutaneous Q24H Rod Can, MD   30 mg at 01/27/18 0813  . HYDROcodone-acetaminophen (NORCO/VICODIN)  5-325 MG per tablet 1-2 tablet  1-2 tablet Oral Q6H PRN Rod Can, MD   1 tablet at 01/27/18 1042  . levothyroxine (SYNTHROID, LEVOTHROID) tablet 25 mcg  25 mcg Oral [id number] Rod Can, MD   25 mcg at 01/27/18 0533  . menthol-cetylpyridinium (CEPACOL) lozenge 3 mg  1 lozenge Oral PRN Swinteck, Aaron Edelman, MD       Or  . phenol (CHLORASEPTIC) mouth spray 1 spray  1 spray Mouth/Throat PRN Swinteck, Aaron Edelman, MD      . metoCLOPramide (REGLAN) tablet 5-10 mg  5-10 mg Oral Q8H PRN Swinteck, Aaron Edelman, MD       Or  . metoCLOPramide (REGLAN) injection 5-10 mg  5-10 mg Intravenous Q8H PRN Swinteck, Aaron Edelman, MD      . morphine 2 MG/ML injection 0.5 mg   0.5 mg Intravenous Q2H PRN Rod Can, MD   0.5 mg at 01/26/18 1147  . ondansetron (ZOFRAN) tablet 4 mg  4 mg Oral Q6H PRN Swinteck, Aaron Edelman, MD       Or  . ondansetron (ZOFRAN) injection 4 mg  4 mg Intravenous Q6H PRN Swinteck, Aaron Edelman, MD      . polyethylene glycol (MIRALAX / GLYCOLAX) packet 17 g  17 g Oral Daily PRN Rod Can, MD   17 g at 01/25/18 1030  . senna (SENOKOT) tablet 8.6 mg  1 tablet Oral BID Rod Can, MD   8.6 mg at 01/27/18 1045     Discharge Medications: Please see discharge summary for a list of discharge medications.  Relevant Imaging Results:  Relevant Lab Results:   Additional Information VXY:[id number]  Lia Hopping, LCSW

## 2018-01-27 NOTE — Progress Notes (Signed)
Plan for d/c to SNF, discharge planning per CSW. [phone] 

## 2018-01-27 NOTE — Progress Notes (Signed)
PROGRESS NOTE    Daniel Schmitt  FOY:[id number] DOB: 05-01-1926 DOA: 01/24/2018 PCP: [name], MD    Brief Narrative:  84 y.o. male with medical history significant of dementia, NSTEMI status post stent placement, basal cell carcinoma. Pt fell and had difficulty getting up. Hip x ray in ED would report desplaced left femoral neck fx.  Assessment & Plan:   Active Problems:   Closed left hip fracture (HCC) - pt is s/p left hip hemiarthroplasty anterior approach - will continue supportive therapy    Dementia - may have been exacerbated due to recent operation and anesthesia but on exam patient is oriented x 3.    Basal cell carcinoma (BCC) of face -stable   NSTEMI (non-ST elevated myocardial infarction) ([place]) - Status post drug-eluting stent in 2017.  Currently on aspirin and Plavix as an outpatient.  No chest pain. -Holding aspirin and Plavix secondary to upcoming surgery - will resume blood thinners once safe from surgeon standpoint.  DVT prophylaxis: SCD's Code Status: Full Family Communication: none at bedside. Disposition Plan: pending PT recommendations after operation   Consultants:   Orthopaedics   Procedures: pending   Antimicrobials: none   Subjective: Son had concerns about delirium vs dementia. Pt oriented x 3 on exam.  Objective: Vitals:   01/26/18 2201 01/27/18 0231 01/27/18 0545 01/27/18 0918  BP: 117/70 122/62 137/68 127/62  Pulse: 63 65 67 64  Resp: 16 17 17 13   Temp: (!) 97.4 F (36.3 C) 97.9 F (36.6 C) 98.1 F (36.7 C) 98 F (36.7 C)  TempSrc: Oral Oral Oral Oral  SpO2: 98% 98% 93% 94%  Weight:      Height:        Intake/Output Summary (Last 24 hours) at 01/27/2018 1021 Last data filed at 01/27/2018 1003 Gross per 24 hour  Intake 3288.75 ml  Output 780 ml  Net 2508.75 ml   Filed Weights   01/24/18 2000  Weight: 60.2 kg (132 lb 11.5 oz)    Examination:   General exam: Pt in nad, Appears calm and comfortable Respiratory  system: Clear to auscultation. Respiratory effort normal. Equal chest rise. Cardiovascular system: S1 & S2 heard, RRR. No JVD, murmurs, rubs, gallops or clicks. No pedal edema. Gastrointestinal system: Abdomen is nondistended, soft and nontender. No organomegaly or masses felt. Normal bowel sounds heard. Central nervous system: Alert and oriented x 3 on exam. No focal neurological deficits. Extremities: warm, + pulses Skin: No rashes, lesions or ulcers on limited exam. Psychiatry:  Mood & affect appropriate.     Data Reviewed: I have personally reviewed following labs and imaging studies  CBC: Recent Labs  Lab 01/24/18 1535 01/25/18 0521 01/26/18 1944 01/27/18 0537  WBC 13.9* 11.9* 12.8* 13.5*  NEUTROABS 9.5*  --   --   --   HGB 11.6* 10.1* 10.3* 10.4*  HCT 34.5* 30.5* 31.0* 30.4*  MCV 95.0 94.4 96.0 94.1  PLT 186 158 133* 767*   Basic Metabolic Panel: Recent Labs  Lab 01/24/18 1535 01/25/18 0521 01/26/18 1944 01/27/18 0537  NA 143 140  --  137  K 3.7 3.2*  --  3.7  CL 111 107  --  104  CO2 23 26  --  23  GLUCOSE 112* 99  --  224*  BUN 25* 22*  --  25*  CREATININE 1.73* 1.74* 1.56* 1.74*  CALCIUM 9.4 8.5*  --  8.4*   GFR: Estimated Creatinine Clearance: 23.2 mL/min (A) (by C-G formula based on SCr of  1.74 mg/dL (H)). Liver Function Tests: No results for input(s): AST, ALT, ALKPHOS, BILITOT, PROT, ALBUMIN in the last 168 hours. No results for input(s): LIPASE, AMYLASE in the last 168 hours. No results for input(s): AMMONIA in the last 168 hours. Coagulation Profile: Recent Labs  Lab 01/24/18 1535  INR 1.03   Cardiac Enzymes: No results for input(s): CKTOTAL, CKMB, CKMBINDEX, TROPONINI in the last 168 hours. BNP (last 3 results) No results for input(s): PROBNP in the last 8760 hours. HbA1C: No results for input(s): HGBA1C in the last 72 hours. CBG: No results for input(s): GLUCAP in the last 168 hours. Lipid Profile: No results for input(s): CHOL, HDL,  LDLCALC, TRIG, CHOLHDL, LDLDIRECT in the last 72 hours. Thyroid Function Tests: No results for input(s): TSH, T4TOTAL, FREET4, T3FREE, THYROIDAB in the last 72 hours. Anemia Panel: No results for input(s): VITAMINB12, FOLATE, FERRITIN, TIBC, IRON, RETICCTPCT in the last 72 hours. Sepsis Labs: No results for input(s): PROCALCITON, LATICACIDVEN in the last 168 hours.  Recent Results (from the past 240 hour(s))  Surgical PCR screen     Status: Abnormal   Collection Time: 01/25/18  1:33 AM  Result Value Ref Range Status   MRSA, PCR NEGATIVE NEGATIVE Final   Staphylococcus aureus POSITIVE (A) NEGATIVE Final    Comment: (NOTE) The Xpert SA Assay (FDA approved for NASAL specimens in patients 16 years of age and older), is one component of a comprehensive surveillance program. It is not intended to diagnose infection nor to guide or monitor treatment. Performed at [facility], [place] [address] [place], [place] [address]          Radiology Studies: Pelvis Portable  Result Date: 01/26/2018 CLINICAL DATA:  Left total hip replacement. EXAM: PORTABLE PELVIS 1-2 VIEWS COMPARISON:  Left hip x-rays dated January 24, 2018. FINDINGS: The left hip demonstrates a hemiarthroplasty without evidence of hardware failure or complication. There is expected intra-articular air. There is no fracture or dislocation. The alignment is anatomic. Post-surgical changes noted in the surrounding soft tissues. The right hip is unremarkable. Osteopenia. Vascular calcifications. IMPRESSION: 1. Interval left hip hemiarthroplasty without evidence of acute postoperative complication. Electronically Signed   By: Titus Dubin M.D.   On: 01/26/2018 18:10   Dg C-arm 1-60 Min-no Report  Result Date: 01/26/2018 Fluoroscopy was utilized by the requesting physician.  No radiographic interpretation.    Scheduled Meds: . carvedilol  25 mg Oral BID WC  . Chlorhexidine Gluconate Cloth  6 each Topical Daily  .  docusate sodium  100 mg Oral BID  . donepezil  5 mg Oral QHS  . enoxaparin (LOVENOX) injection  30 mg Subcutaneous Q24H  . levothyroxine  25 mcg Oral Q0600  . senna  1 tablet Oral BID   Continuous Infusions: . sodium chloride 75 mL/hr at 01/26/18 2215     LOS: 3 days    Time spent: 20 min  Velvet Bathe, MD [facility] Pager [phone]  If 7PM-7AM, please contact night-coverage [email or web address] Password [email or web address] 01/27/2018, 10:21 AM

## 2018-01-27 NOTE — Clinical Social Work Note (Signed)
Clinical Social Work Assessment  Patient Details  Name: Daniel Schmitt MRN: [id number] Date of Birth: [birth year]  Date of referral:  01/27/18               Reason for consult:  Facility Placement                Permission sought to share information with:  Family Supports Permission granted to share information::  Yes, Verbal Permission Granted  Name::        Agency::   SNF  Relationship::   DaughterWynonia Lawman, SHERRY/[phone]/ Son-  Contact Information:     Housing/Transportation Living arrangements for the past 2 months:  Single Family Home Source of Information:  Adult Children Patient Interpreter Needed:  None Criminal Activity/Legal Involvement Pertinent to Current Situation/Hospitalization:  No - Comment as needed Significant Relationships:  Adult Children Lives with:  Adult Children Do you feel safe going back to the place where you live?  Yes Need for family participation in patient care:  Yes (Comment)  Care giving concerns:   Patient need for care.   Social Worker assessment / plan:  CSW met with patient son in law to discuss discharge planning to SNF/ patient daughter not present. Patient son report the patient has been declining over the past few years. He reports concerns about the mental capacity and behaviors and possibly having dementia. He reports the patient has not been evaluated for dementia and was hoping the physician may look into him being seen while here at the hospital. Patient son reports the patient wife has her own health issues, she cannot care for the patient like she has done in the past. He is agreeable the patient will need rehab before retuning home.   CSW explain SNF process and will later follow up with bed offers.  FL2 complete.   Plan: SNF   Employment status:    Insurance information:  Medicare PT Recommendations:  [place] / Referral to community resources:  [place]  Patient/Family's  Response to care: Agreeable and Responding well to care.   Patient/Family's Understanding of and Emotional Response to Diagnosis, Current Treatment, and Prognosis:  Patient has dementia. Patient adult children has a good understanding of patient diagnosis and care.   Emotional Assessment Appearance:  Appears stated age Attitude/Demeanor/Rapport:    Affect (typically observed):  Calm Orientation:  Oriented to Self Alcohol / Substance use:  Not Applicable Psych involvement (Current and /or in the community):     Discharge Needs  Concerns to be addressed:  Discharge Planning Concerns Readmission within the last 30 days:  Yes Current discharge risk:  Dependent with Mobility Barriers to Discharge:  Continued Medical Work up   [employer], LCSW 01/27/2018, 2:19 PM

## 2018-01-27 NOTE — Progress Notes (Signed)
OT Cancellation Note  Patient Details Name: KADEEN SROKA MRN: [id number] DOB: 1926/02/05   Cancelled Treatment:    Reason Eval/Treat Not Completed: Other (comment). Plan is for SNF; will defer OT evaluation to that venue  [place] 01/27/2018, 12:27 PM  Lesle Chris, OTR/L [phone] 01/27/2018

## 2018-01-27 NOTE — Progress Notes (Signed)
    Subjective:  Patient reports pain as mild to moderate.  Denies N/V/CP/SOB.   Objective:   VITALS:   Vitals:   01/26/18 2201 01/27/18 0231 01/27/18 0545 01/27/18 0918  BP: 117/70 122/62 137/68 127/62  Pulse: 63 65 67 64  Resp: 16 17 17 13   Temp: (!) 97.4 F (36.3 C) 97.9 F (36.6 C) 98.1 F (36.7 C) 98 F (36.7 C)  TempSrc: Oral Oral Oral Oral  SpO2: 98% 98% 93% 94%  Weight:      Height:        NAD, oriented to self only ABD soft Sensation intact distally Intact pulses distally Dorsiflexion/Plantar flexion intact Incision: dressing C/D/I Compartment soft   Lab Results  Component Value Date   WBC 13.5 (H) 01/27/2018   HGB 10.4 (L) 01/27/2018   HCT 30.4 (L) 01/27/2018   MCV 94.1 01/27/2018   PLT 145 (L) 01/27/2018   BMET    Component Value Date/Time   NA 137 01/27/2018 0537   K 3.7 01/27/2018 0537   CL 104 01/27/2018 0537   CO2 23 01/27/2018 0537   GLUCOSE 224 (H) 01/27/2018 0537   BUN 25 (H) 01/27/2018 0537   CREATININE 1.74 (H) 01/27/2018 0537   CREATININE 1.90 (H) 10/11/2017 1344   CREATININE 1.48 (H) 04/09/2016 1200   CALCIUM 8.4 (L) 01/27/2018 0537   GFRNONAA 33 (L) 01/27/2018 0537   GFRAA 38 (L) 01/27/2018 0537     Assessment/Plan: 1 Day Post-Op   Active Problems:   NSTEMI (non-ST elevated myocardial infarction) (HCC)   Dementia   Basal cell carcinoma (BCC) of face   Closed left hip fracture (HCC)   Closed displaced fracture of left femoral neck (HCC)   WBAT with walker DVT ppx: Lovenox, SCDs, TEDS PO pain control PT/OT Dispo: d/c planning, likely SNF placement when medically ready    [place] Rishit Burkhalter 01/27/2018, 1:05 PM   Rod Can, MD Cell [phone]

## 2018-01-27 NOTE — Evaluation (Signed)
Physical Therapy Evaluation Patient Details Name: Daniel Schmitt MRN: [id number] DOB: Jan 11, 1926 Today's Date: 01/27/2018   History of Present Illness  84 y.o. male with medical history significant of dementia, NSTEMI status post stent placement, basal cell carcinoma. Pt fell and had difficulty getting up. Hip x ray: displaced left femoral neck fx.  Pt s/p left direct anterior hip hemiarthroplasty 01/26/18  Clinical Impression  Pt admitted with above diagnosis. Pt currently with functional limitations due to the deficits listed below (see PT Problem List).  Pt will benefit from skilled PT to increase their independence and safety with mobility to allow discharge to the venue listed below.   Pt premedicated for session yet reported he had not had pain medicine (then realized the time, thought 3 hrs had passed).  Pt agreeable to mobilize and performed short distance ambulation.  Pt requiring min assist for stability and frequent cues for safety.  Pt is high fall risk.  Recommend SNF upon d/c.     Follow Up Recommendations SNF    Equipment Recommendations  Rolling walker with 5" wheels    Recommendations for Other Services       Precautions / Restrictions Precautions Precautions: Fall Restrictions Weight Bearing Restrictions: No LLE Weight Bearing: Weight bearing as tolerated      Mobility  Bed Mobility Overal bed mobility: Needs Assistance Bed Mobility: Supine to Sit     Supine to sit: Min guard;HOB elevated     General bed mobility comments: pt requested to perform as much as possible, required bil hands to self assist  Transfers Overall transfer level: Needs assistance Equipment used: Rolling walker (2 wheeled) Transfers: Sit to/from Stand Sit to Stand: Min assist         General transfer comment: assist to steady with rise as pt attempting to get up without RW, cues for safety  Ambulation/Gait Ambulation/Gait assistance: Min assist Ambulation Distance (Feet): 40  Feet Assistive device: Rolling walker (2 wheeled) Gait Pattern/deviations: Step-to pattern;Decreased stance time - left;Antalgic     General Gait Details: verbal cues for sequence, RW positioning, safety, distance to tolerance, limited by pain and fatigue  Stairs            Wheelchair Mobility    Modified Rankin (Stroke Patients Only)       Balance Overall balance assessment: History of Falls;Needs assistance         Standing balance support: Bilateral upper extremity supported Standing balance-Leahy Scale: Poor                               Pertinent Vitals/Pain Pain Assessment: Faces Faces Pain Scale: Hurts little more Pain Location: L hip Pain Descriptors / Indicators: Grimacing;Guarding Pain Intervention(s): Limited activity within patient's tolerance;Repositioned;Monitored during session;Premedicated before session    Home Living Family/patient expects to be discharged to:: Skilled nursing facility               Home Equipment: None      Prior Function           Comments: pt poor historian, reports falling down stairs     Hand Dominance        Extremity/Trunk Assessment        Lower Extremity Assessment Lower Extremity Assessment: LLE deficits/detail LLE Deficits / Details: observed grossly 2+/5 hip strength, pt self assisted L LE as needed with mobility       Communication   Communication: No difficulties  Cognition  Arousal/Alertness: Awake/alert Behavior During Therapy: WFL for tasks assessed/performed Overall Cognitive Status: History of cognitive impairments - at baseline                                 General Comments: ?dementia, follows simple commands however requires safety cues      General Comments      Exercises     Assessment/Plan    PT Assessment Patient needs continued PT services  PT Problem List Decreased strength;Decreased mobility;Decreased activity tolerance;Decreased range  of motion;Pain;Decreased knowledge of use of DME;Decreased balance;Decreased cognition;Decreased knowledge of precautions;Decreased safety awareness       PT Treatment Interventions DME instruction;Therapeutic activities;Gait training;Functional mobility training;Balance training;Therapeutic exercise;Patient/family education    PT Goals (Current goals can be found in the Care Plan section)  Acute Rehab PT Goals PT Goal Formulation: With patient/family Time For Goal Achievement: 02/10/18 Potential to Achieve Goals: Good    Frequency Min 3X/week   Barriers to discharge        Co-evaluation               AM-PAC PT "6 Clicks" Daily Activity  Outcome Measure Difficulty turning over in bed (including adjusting bedclothes, sheets and blankets)?: A Lot Difficulty moving from lying on back to sitting on the side of the bed? : A Lot Difficulty sitting down on and standing up from a chair with arms (e.g., wheelchair, bedside commode, etc,.)?: Unable Help needed moving to and from a bed to chair (including a wheelchair)?: A Little Help needed walking in hospital room?: A Little Help needed climbing 3-5 steps with a railing? : A Lot 6 Click Score: 13    End of Session Equipment Utilized During Treatment: Gait belt Activity Tolerance: Patient tolerated treatment well Patient left: in chair;with chair alarm set;with call bell/phone within reach Nurse Communication: Mobility status PT Visit Diagnosis: Difficulty in walking, not elsewhere classified (R26.2)    Time: 0277-4128 PT Time Calculation (min) (ACUTE ONLY): 19 min   Charges:   PT Evaluation $PT Eval Low Complexity: 1 Low     PT G CodesCarmelia Bake, PT, DPT 01/27/2018 Pager: [phone]  York Ram E 01/27/2018, 1:16 PM

## 2018-01-28 LAB — BASIC METABOLIC PANEL
Anion gap: 7 (ref 5–15)
BUN: 33 mg/dL — AB (ref 6–20)
CHLORIDE: 106 mmol/L (ref 101–111)
CO2: 23 mmol/L (ref 22–32)
CREATININE: 1.59 mg/dL — AB (ref 0.61–1.24)
Calcium: 8.3 mg/dL — ABNORMAL LOW (ref 8.9–10.3)
GFR calc Af Amer: 43 mL/min — ABNORMAL LOW (ref >60)
GFR calc non Af Amer: 37 mL/min — ABNORMAL LOW (ref >60)
GLUCOSE: 122 mg/dL — AB (ref 65–99)
Potassium: 4 mmol/L (ref 3.5–5.1)
Sodium: 136 mmol/L (ref 135–145)

## 2018-01-28 LAB — CBC
HEMATOCRIT: 25.2 % — AB (ref 39.0–52.0)
HEMOGLOBIN: 8.5 g/dL — AB (ref 13.0–17.0)
MCH: 32.1 pg (ref 26.0–34.0)
MCHC: 33.7 g/dL (ref 30.0–36.0)
MCV: 95.1 fL (ref 78.0–100.0)
Platelets: 154 10*3/uL (ref 150–400)
RBC: 2.65 MIL/uL — ABNORMAL LOW (ref 4.22–5.81)
RDW: 13.8 % (ref 11.5–15.5)
WBC: 14.2 10*3/uL — ABNORMAL HIGH (ref 4.0–10.5)

## 2018-01-28 NOTE — Care Management Important Message (Signed)
Important Message  Patient Details  Name: [name] MRN: [id number] Date of Birth: [birth year]   Medicare Important Message Given:  Yes    Erenest Rasher, RN 01/28/2018, 1:35 PM

## 2018-01-28 NOTE — Progress Notes (Signed)
Subjective: 2 Days Post-Op Procedure(s) (LRB): LEFT TOTAL HIP ARTHROPLASTY ANTERIOR APPROACH (Left)  Patient reports pain as mild to moderate.  Tolerating POs well.  Admits to flatus.  Denies fever, chills, N/V, CP, SOB.  Objective:   VITALS:  Temp:  [97.6 F (36.4 C)] 97.6 F (36.4 C) (06/07 1425) Pulse Rate:  [66-68] 67 (06/08 0845) Resp:  [12-20] 20 (06/07 2217) BP: (136-146)/(68-69) 146/68 (06/08 0845) SpO2:  [95 %] 95 % (06/07 1425)  General: WDWN patient in NAD. Psych:  Appropriate mood and affect. Neuro:  A&O x 3, Moving all extremities, sensation intact to light touch HEENT:  EOMs intact Chest:  Even non-labored respirations Skin:  Dressing C/D/I, no rashes or lesions Extremities: warm/dry, mild edema, no erythema or echymosis.  No lymphadenopathy. Pulses: Popliteus 2+ MSK:  ROM: TKE, MMT: able to perform quad set, (-) Homan's    LABS Recent Labs    01/26/18 1944 01/27/18 0537  HGB 10.3* 10.4*  WBC 12.8* 13.5*  PLT 133* 145*   Recent Labs    01/27/18 0537 01/28/18 0722  NA 137 136  K 3.7 4.0  CL 104 106  CO2 23 23  BUN 25* 33*  CREATININE 1.74* 1.59*  GLUCOSE 224* 122*   No results for input(s): LABPT, INR in the last 72 hours.   Assessment/Plan: 2 Days Post-Op Procedure(s) (LRB): LEFT TOTAL HIP ARTHROPLASTY ANTERIOR APPROACH (Left)  Patient seen in rounds for Dr. Areta Haber L LE with walker Lovenox for DVT prophylaxis Up with therapy Disp: likely SNF Plan for 2 week outpatient post-op visit with Dr. Riley Lam PA-C [facility] Office:  [phone]

## 2018-01-28 NOTE — Progress Notes (Addendum)
Patient has safety sitter at bedside, unable to discharge SNF today. Patient needs to be 24 hours without sitter. Physician informed.   CSW attempted to call patient daughter and son in law 3x's to discuss SNF offers. CSW left voicemail. Per nurse patient family will return this evening. CSW left SNF list in room. CSW will follow up in the am.   Kathrin Greathouse, Latanya Presser, MSW Clinical Social Worker  =

## 2018-01-28 NOTE — Discharge Summary (Addendum)
Physician Discharge Summary  Daniel Schmitt DPO:[id number] DOB: 05-16-1926 DOA: 01/24/2018  PCP: [name], MD  Admit date: 01/24/2018 Discharge date: 01/28/2018  Time spent: 36 minutes  Recommendations for Outpatient Follow-up:  1. Ensure pt f/u with orthopaedic surgeon   Discharge Diagnoses:  Active Problems:   NSTEMI (non-ST elevated myocardial infarction) (HCC)   Dementia   Basal cell carcinoma (BCC) of face   Closed left hip fracture (HCC)   Closed displaced fracture of left femoral neck ([place])   Discharge Condition: stable  Diet recommendation: regular diet  Filed Weights   01/24/18 2000  Weight: 60.2 kg (132 lb 11.5 oz)    History of present illness:  84 y.o.malewith medical history significant ofdementia, NSTEMI status post stent placement, basal cell carcinoma.Pt fell and had difficulty getting up. Hip x ray in ED would report desplaced left femoral neck fx.  Hospital Course:  Active Problems:   Closed left hip fracture (HCC) - pt is s/p left hip hemiarthroplasty anterior approach - d/c to SNF once bed availablem, order placed.    Dementia - stable. Continue outpatient monitoring/therapy.    Basal cell carcinoma (BCC) of face -stable   NSTEMI (non-ST elevated myocardial infarction) ([place]) - Status post drug-eluting stent in 2017. Currently on aspirin and Plavix as an outpatient. will continue on discharge  Procedures:  none  Consultations:  None  Discharge Exam: Vitals:   01/27/18 2217 01/28/18 0845  BP:  (!) 146/68  Pulse: 68 67  Resp: 20   Temp:    SpO2:      General: Pt in nad, alert and awake Cardiovascular: rrr, no rubs Respiratory: no increased wob, no wheezes  Discharge Instructions   Discharge Instructions    Call MD for:  severe uncontrolled pain   Complete by:  As directed    Call MD for:  temperature >100.4   Complete by:  As directed    Diet - low sodium heart healthy   Complete by:  As directed    Discharge  instructions   Complete by:  As directed    Ensure patient follows up with orhtopeadic surgeon after hospital discharge.   Increase activity slowly   Complete by:  As directed      Allergies as of 01/28/2018      Reactions   Aspirin Other (See Comments)   Stomach bleeds       Medication List    TAKE these medications   acetaminophen 500 MG tablet Commonly known as:  TYLENOL Take 1,000 mg by mouth every 6 (six) hours as needed for pain.   amLODipine 10 MG tablet Commonly known as:  NORVASC Take 1 tablet (10 mg total) by mouth daily.   aspirin 81 MG EC tablet Take 1 tablet (81 mg total) by mouth daily.   atorvastatin 80 MG tablet Commonly known as:  LIPITOR Take 1 tablet (80 mg total) by mouth daily at 6 PM.   carvedilol 12.5 MG tablet Commonly known as:  COREG TAKE 1 TABLET (12.5 MG TOTAL) BY MOUTH 2 (TWO) TIMES DAILY WITH A MEAL. What changed:  how much to take   clopidogrel 75 MG tablet Commonly known as:  PLAVIX TAKE 1 TABLET BY MOUTH EVERY DAY   donepezil 5 MG tablet Commonly known as:  ARICEPT Take 5 mg by mouth at bedtime.   enoxaparin 30 MG/0.3ML injection Commonly known as:  LOVENOX Inject 0.3 mLs (30 mg total) into the skin daily.   HYDROcodone-acetaminophen 5-325 MG tablet Commonly known  as:  NORCO/VICODIN Take 1-2 tablets by mouth every 6 (six) hours as needed (postop hip pain).   levothyroxine 25 MCG tablet Commonly known as:  SYNTHROID, LEVOTHROID Take 25 mcg by mouth daily before breakfast.   nitroGLYCERIN 0.4 MG SL tablet Commonly known as:  NITROSTAT Place 1 tablet (0.4 mg total) under the tongue every 5 (five) minutes x 3 doses as needed for chest pain.      Allergies  Allergen Reactions  . Aspirin Other (See Comments)    Stomach bleeds    Follow-up Information    Swinteck, Aaron Edelman, MD. Schedule an appointment as soon as possible for a visit in 2 weeks.   Specialty:  Orthopedic Surgery Why:  For wound re-check Contact  information: [address] [place] [address] [phone]            The results of significant diagnostics from this hospitalization (including imaging, microbiology, ancillary and laboratory) are listed below for reference.    Significant Diagnostic Studies: Dg Elbow 2 Views Left  Result Date: 01/24/2018 CLINICAL DATA:  [place] today, diffuse LEFT elbow pain with laceration, LEFT hip pain EXAM: LEFT ELBOW - 2 VIEW COMPARISON:  None FINDINGS: Osseous demineralization. Joint spaces preserved. No acute fracture, dislocation, or bone destruction. No elbow joint effusion. Dorsal soft tissue swelling overlying olecranon. IMPRESSION: No acute osseous abnormalities. Electronically Signed   By: Lavonia Dana M.D.   On: 01/24/2018 14:58   Ct Head Wo Contrast  Result Date: 01/24/2018 CLINICAL DATA:  Status post fall.  Head trauma.  On anticoagulation. EXAM: CT HEAD WITHOUT CONTRAST TECHNIQUE: Contiguous axial images were obtained from the base of the skull through the vertex without intravenous contrast. COMPARISON:  Brain MRI dated 03/06/2013. FINDINGS: Brain: Confluent areas of low attenuation are again seen throughout the bilateral periventricular and subcortical white matter regions, consistent with chronic small vessel ischemic changes, not significantly changed in extent compared to earlier brain MRI. There is no mass, hemorrhage, edema or other evidence of acute parenchymal abnormality. No extra-axial hemorrhage. Vascular: There are chronic calcified atherosclerotic changes of the large vessels at the skull base. No unexpected hyperdense vessel. Skull: Normal. Negative for fracture or focal lesion. Sinuses/Orbits: No acute finding. Other: None. IMPRESSION: 1. No acute findings. No intracranial hemorrhage or edema. No skull fracture. 2. Chronic small vessel ischemic changes within the white matter. Electronically Signed   By: Franki Cabot M.D.   On: 01/24/2018 15:46   Pelvis  Portable  Result Date: 01/26/2018 CLINICAL DATA:  Left total hip replacement. EXAM: PORTABLE PELVIS 1-2 VIEWS COMPARISON:  Left hip x-rays dated January 24, 2018. FINDINGS: The left hip demonstrates a hemiarthroplasty without evidence of hardware failure or complication. There is expected intra-articular air. There is no fracture or dislocation. The alignment is anatomic. Post-surgical changes noted in the surrounding soft tissues. The right hip is unremarkable. Osteopenia. Vascular calcifications. IMPRESSION: 1. Interval left hip hemiarthroplasty without evidence of acute postoperative complication. Electronically Signed   By: Titus Dubin M.D.   On: 01/26/2018 18:10   Dg C-arm 1-60 Min-no Report  Result Date: 01/26/2018 Fluoroscopy was utilized by the requesting physician.  No radiographic interpretation.   Dg Hip Unilat With Pelvis 2-3 Views Left  Result Date: 01/24/2018 CLINICAL DATA:  Fall today with left hip pain.  Initial encounter. EXAM: DG HIP (WITH OR WITHOUT PELVIS) 2-3V LEFT COMPARISON:  None. FINDINGS: Left femoral neck fracture with displacement and varus angulation. No notable hip degeneration. Osteopenia and atherosclerosis. Remote left  groin hernia repair. IMPRESSION: Displaced left femoral neck fracture. Electronically Signed   By: Monte Fantasia M.D.   On: 01/24/2018 14:59    Microbiology: Recent Results (from the past 240 hour(s))  Surgical PCR screen     Status: Abnormal   Collection Time: 01/25/18  1:33 AM  Result Value Ref Range Status   MRSA, PCR NEGATIVE NEGATIVE Final   Staphylococcus aureus POSITIVE (A) NEGATIVE Final    Comment: (NOTE) The Xpert SA Assay (FDA approved for NASAL specimens in patients 73 years of age and older), is one component of a comprehensive surveillance program. It is not intended to diagnose infection nor to guide or monitor treatment. Performed at [facility], [place] [address] [place], [place] [address]       Labs: Basic Metabolic Panel: Recent Labs  Lab 01/24/18 1535 01/25/18 0521 01/26/18 1944 01/27/18 0537 01/28/18 0722  NA 143 140  --  137 136  K 3.7 3.2*  --  3.7 4.0  CL 111 107  --  104 106  CO2 23 26  --  23 23  GLUCOSE 112* 99  --  224* 122*  BUN 25* 22*  --  25* 33*  CREATININE 1.73* 1.74* 1.56* 1.74* 1.59*  CALCIUM 9.4 8.5*  --  8.4* 8.3*   Liver Function Tests: No results for input(s): AST, ALT, ALKPHOS, BILITOT, PROT, ALBUMIN in the last 168 hours. No results for input(s): LIPASE, AMYLASE in the last 168 hours. No results for input(s): AMMONIA in the last 168 hours. CBC: Recent Labs  Lab 01/24/18 1535 01/25/18 0521 01/26/18 1944 01/27/18 0537  WBC 13.9* 11.9* 12.8* 13.5*  NEUTROABS 9.5*  --   --   --   HGB 11.6* 10.1* 10.3* 10.4*  HCT 34.5* 30.5* 31.0* 30.4*  MCV 95.0 94.4 96.0 94.1  PLT 186 158 133* 145*   Cardiac Enzymes: No results for input(s): CKTOTAL, CKMB, CKMBINDEX, TROPONINI in the last 168 hours. BNP: BNP (last 3 results) No results for input(s): BNP in the last 8760 hours.  ProBNP (last 3 results) No results for input(s): PROBNP in the last 8760 hours.  CBG: No results for input(s): GLUCAP in the last 168 hours.     Signed:  Velvet Bathe MD.  [facility] 01/28/2018, 10:35 AM  Stable for discharge.  Velvet Bathe MD  01/29/18

## 2018-01-29 DIAGNOSIS — Z7401 Bed confinement status: Secondary | ICD-10-CM | POA: Diagnosis not present

## 2018-01-29 DIAGNOSIS — F0390 Unspecified dementia without behavioral disturbance: Secondary | ICD-10-CM | POA: Diagnosis not present

## 2018-01-29 DIAGNOSIS — R630 Anorexia: Secondary | ICD-10-CM | POA: Diagnosis not present

## 2018-01-29 DIAGNOSIS — N184 Chronic kidney disease, stage 4 (severe): Secondary | ICD-10-CM | POA: Diagnosis not present

## 2018-01-29 DIAGNOSIS — Y9301 Activity, walking, marching and hiking: Secondary | ICD-10-CM | POA: Diagnosis not present

## 2018-01-29 DIAGNOSIS — R195 Other fecal abnormalities: Secondary | ICD-10-CM | POA: Diagnosis not present

## 2018-01-29 DIAGNOSIS — I252 Old myocardial infarction: Secondary | ICD-10-CM | POA: Diagnosis not present

## 2018-01-29 DIAGNOSIS — S72002D Fracture of unspecified part of neck of left femur, subsequent encounter for closed fracture with routine healing: Secondary | ICD-10-CM | POA: Diagnosis not present

## 2018-01-29 DIAGNOSIS — D649 Anemia, unspecified: Secondary | ICD-10-CM | POA: Diagnosis not present

## 2018-01-29 DIAGNOSIS — Z7901 Long term (current) use of anticoagulants: Secondary | ICD-10-CM | POA: Diagnosis not present

## 2018-01-29 DIAGNOSIS — N189 Chronic kidney disease, unspecified: Secondary | ICD-10-CM | POA: Diagnosis not present

## 2018-01-29 DIAGNOSIS — D62 Acute posthemorrhagic anemia: Secondary | ICD-10-CM | POA: Diagnosis not present

## 2018-01-29 DIAGNOSIS — Z955 Presence of coronary angioplasty implant and graft: Secondary | ICD-10-CM | POA: Diagnosis not present

## 2018-01-29 DIAGNOSIS — I2510 Atherosclerotic heart disease of native coronary artery without angina pectoris: Secondary | ICD-10-CM | POA: Diagnosis not present

## 2018-01-29 DIAGNOSIS — Y92009 Unspecified place in unspecified non-institutional (private) residence as the place of occurrence of the external cause: Secondary | ICD-10-CM | POA: Diagnosis not present

## 2018-01-29 DIAGNOSIS — E870 Hyperosmolality and hypernatremia: Secondary | ICD-10-CM | POA: Diagnosis not present

## 2018-01-29 DIAGNOSIS — Z85828 Personal history of other malignant neoplasm of skin: Secondary | ICD-10-CM | POA: Diagnosis not present

## 2018-01-29 DIAGNOSIS — R2689 Other abnormalities of gait and mobility: Secondary | ICD-10-CM | POA: Diagnosis not present

## 2018-01-29 DIAGNOSIS — Z9181 History of falling: Secondary | ICD-10-CM | POA: Diagnosis not present

## 2018-01-29 DIAGNOSIS — K315 Obstruction of duodenum: Secondary | ICD-10-CM | POA: Diagnosis not present

## 2018-01-29 DIAGNOSIS — S72012A Unspecified intracapsular fracture of left femur, initial encounter for closed fracture: Secondary | ICD-10-CM | POA: Diagnosis present

## 2018-01-29 DIAGNOSIS — K922 Gastrointestinal hemorrhage, unspecified: Secondary | ICD-10-CM | POA: Diagnosis not present

## 2018-01-29 DIAGNOSIS — D72829 Elevated white blood cell count, unspecified: Secondary | ICD-10-CM | POA: Diagnosis not present

## 2018-01-29 DIAGNOSIS — K269 Duodenal ulcer, unspecified as acute or chronic, without hemorrhage or perforation: Secondary | ICD-10-CM | POA: Diagnosis not present

## 2018-01-29 DIAGNOSIS — K449 Diaphragmatic hernia without obstruction or gangrene: Secondary | ICD-10-CM | POA: Diagnosis not present

## 2018-01-29 DIAGNOSIS — Z7902 Long term (current) use of antithrombotics/antiplatelets: Secondary | ICD-10-CM | POA: Diagnosis not present

## 2018-01-29 DIAGNOSIS — D509 Iron deficiency anemia, unspecified: Secondary | ICD-10-CM | POA: Diagnosis not present

## 2018-01-29 DIAGNOSIS — K210 Gastro-esophageal reflux disease with esophagitis: Secondary | ICD-10-CM | POA: Diagnosis not present

## 2018-01-29 DIAGNOSIS — Z79890 Hormone replacement therapy (postmenopausal): Secondary | ICD-10-CM | POA: Diagnosis not present

## 2018-01-29 DIAGNOSIS — Z23 Encounter for immunization: Secondary | ICD-10-CM | POA: Diagnosis not present

## 2018-01-29 DIAGNOSIS — Z96642 Presence of left artificial hip joint: Secondary | ICD-10-CM | POA: Diagnosis not present

## 2018-01-29 DIAGNOSIS — R41841 Cognitive communication deficit: Secondary | ICD-10-CM | POA: Diagnosis not present

## 2018-01-29 DIAGNOSIS — Z87891 Personal history of nicotine dependence: Secondary | ICD-10-CM | POA: Diagnosis not present

## 2018-01-29 DIAGNOSIS — N179 Acute kidney failure, unspecified: Secondary | ICD-10-CM | POA: Diagnosis not present

## 2018-01-29 DIAGNOSIS — K319 Disease of stomach and duodenum, unspecified: Secondary | ICD-10-CM | POA: Diagnosis not present

## 2018-01-29 DIAGNOSIS — M25552 Pain in left hip: Secondary | ICD-10-CM | POA: Diagnosis not present

## 2018-01-29 DIAGNOSIS — I214 Non-ST elevation (NSTEMI) myocardial infarction: Secondary | ICD-10-CM | POA: Diagnosis not present

## 2018-01-29 DIAGNOSIS — S51012A Laceration without foreign body of left elbow, initial encounter: Secondary | ICD-10-CM | POA: Diagnosis not present

## 2018-01-29 DIAGNOSIS — I959 Hypotension, unspecified: Secondary | ICD-10-CM | POA: Diagnosis not present

## 2018-01-29 DIAGNOSIS — Z7982 Long term (current) use of aspirin: Secondary | ICD-10-CM | POA: Diagnosis not present

## 2018-01-29 DIAGNOSIS — W109XXA Fall (on) (from) unspecified stairs and steps, initial encounter: Secondary | ICD-10-CM | POA: Diagnosis not present

## 2018-01-29 DIAGNOSIS — K921 Melena: Secondary | ICD-10-CM | POA: Diagnosis not present

## 2018-01-29 DIAGNOSIS — Z471 Aftercare following joint replacement surgery: Secondary | ICD-10-CM | POA: Diagnosis not present

## 2018-01-29 DIAGNOSIS — M6281 Muscle weakness (generalized): Secondary | ICD-10-CM | POA: Diagnosis not present

## 2018-01-29 DIAGNOSIS — M2550 Pain in unspecified joint: Secondary | ICD-10-CM | POA: Diagnosis not present

## 2018-01-29 DIAGNOSIS — K2270 Barrett's esophagus without dysplasia: Secondary | ICD-10-CM | POA: Diagnosis not present

## 2018-01-29 DIAGNOSIS — Z79899 Other long term (current) drug therapy: Secondary | ICD-10-CM | POA: Diagnosis not present

## 2018-01-29 LAB — CBC
HCT: 24.4 % — ABNORMAL LOW (ref 39.0–52.0)
Hemoglobin: 8.3 g/dL — ABNORMAL LOW (ref 13.0–17.0)
MCH: 31.9 pg (ref 26.0–34.0)
MCHC: 34 g/dL (ref 30.0–36.0)
MCV: 93.8 fL (ref 78.0–100.0)
PLATELETS: 169 10*3/uL (ref 150–400)
RBC: 2.6 MIL/uL — ABNORMAL LOW (ref 4.22–5.81)
RDW: 14.5 % (ref 11.5–15.5)
WBC: 12 10*3/uL — AB (ref 4.0–10.5)

## 2018-01-29 NOTE — Progress Notes (Signed)
Patient will discharge to [facility]. Anticipated discharge date: 01/29/18 Family notified: Lucendia Herrlich, son in law Transportation by: [facility]  Nurse to call report to [phone]. Patient will go to room 606P in [facility] section of SNF.   CSW signing off.  Estanislado Emms, [facility]  Clinical Social Worker

## 2018-01-29 NOTE — Progress Notes (Addendum)
Report called to Diane at [facility]. Pt discharged from floor via stretcher for transport to [place] by ambulance. Belongings with pt, accomp by EMT. Harper Smoker, [employer]

## 2018-01-29 NOTE — Clinical Social Work Placement (Signed)
   CLINICAL SOCIAL WORK PLACEMENT  NOTE  Date:  01/29/2018  Patient Details  Name: Daniel Schmitt MRN: [id number] Date of Birth: 1926-06-08  Clinical Social Work is seeking post-discharge placement for this patient at the [place] level of care (*CSW will initial, date and re-position this form in  chart as items are completed):  Yes   Patient/family provided with [facility] Work Department's list of facilities offering this level of care within the geographic area requested by the patient (or if unable, by the patient's family).  Yes   Patient/family informed of their freedom to choose among providers that offer the needed level of care, that participate in Medicare, Medicaid or managed care program needed by the patient, have an available bed and are willing to accept the patient.  Yes   Patient/family informed of [facility]'s ownership interest in [facility] and [facility], as well as of the fact that they are under no obligation to receive care at these facilities.  PASRR submitted to EDS on 01/27/18     PASRR number received on 01/27/18     Existing PASRR number confirmed on       FL2 transmitted to all facilities in geographic area requested by pt/family on 01/27/18     FL2 transmitted to all facilities within larger geographic area on       Patient informed that his/her managed care company has contracts with or will negotiate with certain facilities, including the following:  [employer] informed of bed offers received.  Patient chooses bed at [facility]     Physician recommends and patient chooses bed at      Patient to be transferred to [facility] on 01/29/18.  Patient to be transferred to facility by [facility]     Patient family notified on 01/29/18 of transfer.  Name of family member notified:  Lucendia Herrlich, son in law     PHYSICIAN       Additional Comment:     _______________________________________________ Estanislado Emms, LCSW 01/29/2018, 10:20 AM

## 2018-01-29 NOTE — Progress Notes (Signed)
NIKOLAUS PIENTA  MRN: [id number] DOB/Age: Oct 19, 1925 84 y.o. Physician: Ander Slade, M.D. 3 Days Post-Op Procedure(s) (LRB): LEFT TOTAL HIP ARTHROPLASTY ANTERIOR APPROACH (Left)  Subjective: Patient resting comfortably in bed. Family at bedside Vital Signs Temp:  [98.6 F (37 C)-99.2 F (37.3 C)] 99.2 F (37.3 C) (06/09 0557) Pulse Rate:  [72-73] 73 (06/09 0557) Resp:  [15-18] 15 (06/09 0557) BP: (123-133)/(59-64) 133/62 (06/09 0557) SpO2:  [90 %-94 %] 90 % (06/09 0557)  Lab Results Recent Labs    01/28/18 0722 01/29/18 0459  WBC 14.2* 12.0*  HGB 8.5* 8.3*  HCT 25.2* 24.4*  PLT 154 169   BMET Recent Labs    01/27/18 0537 01/28/18 0722  NA 137 136  K 3.7 4.0  CL 104 106  CO2 23 23  GLUCOSE 224* 122*  BUN 25* 33*  CREATININE 1.74* 1.59*  CALCIUM 8.4* 8.3*   INR  Date Value Ref Range Status  01/24/2018 1.03  Final    Comment:    Performed at [facility], [place] [address], [place], [place] [address]     Exam  Acquacel dressing intact, clean and dry, thigh soft, n/v intact  Plan Discussed with family d/c plan. They have questions regarding long term care and reviewed with CSW and family options. D?C order in place. F/u with Dr. Lyla Glassing approx 2 weeks post op. Federica Allport M Cydnie Deason 01/29/2018, 9:34 AM    Contact # [phone]

## 2018-01-29 NOTE — Anesthesia Postprocedure Evaluation (Signed)
Anesthesia Post Note  Patient: Daniel Schmitt  Procedure(s) Performed: LEFT TOTAL HIP ARTHROPLASTY ANTERIOR APPROACH (Left Hip)     Patient location during evaluation: PACU Anesthesia Type: General Level of consciousness: awake and alert Pain management: pain level controlled Vital Signs Assessment: post-procedure vital signs reviewed and stable Respiratory status: spontaneous breathing, nonlabored ventilation, respiratory function stable and patient connected to nasal cannula oxygen Cardiovascular status: blood pressure returned to baseline and stable Postop Assessment: no apparent nausea or vomiting Anesthetic complications: no    Last Vitals:  Vitals:   01/28/18 2145 01/29/18 0557  BP: (!) 131/59 133/62  Pulse: 72 73  Resp: 15 15  Temp: 37 C 37.3 C  SpO2: 93% 90%    Last Pain:  Vitals:   01/29/18 0800  TempSrc:   PainSc: 0-No pain                 Audry Pili

## 2018-01-30 DIAGNOSIS — N189 Chronic kidney disease, unspecified: Secondary | ICD-10-CM | POA: Diagnosis not present

## 2018-01-30 DIAGNOSIS — I214 Non-ST elevation (NSTEMI) myocardial infarction: Secondary | ICD-10-CM | POA: Diagnosis not present

## 2018-01-30 DIAGNOSIS — S72002D Fracture of unspecified part of neck of left femur, subsequent encounter for closed fracture with routine healing: Secondary | ICD-10-CM | POA: Diagnosis not present

## 2018-01-30 DIAGNOSIS — D649 Anemia, unspecified: Secondary | ICD-10-CM | POA: Diagnosis not present

## 2018-01-31 DIAGNOSIS — Z9181 History of falling: Secondary | ICD-10-CM | POA: Diagnosis not present

## 2018-01-31 DIAGNOSIS — F0390 Unspecified dementia without behavioral disturbance: Secondary | ICD-10-CM | POA: Diagnosis not present

## 2018-01-31 DIAGNOSIS — M25552 Pain in left hip: Secondary | ICD-10-CM | POA: Diagnosis not present

## 2018-01-31 DIAGNOSIS — R2689 Other abnormalities of gait and mobility: Secondary | ICD-10-CM | POA: Diagnosis not present

## 2018-02-01 ENCOUNTER — Other Ambulatory Visit: Payer: Self-pay | Admitting: Cardiovascular Disease

## 2018-02-01 DIAGNOSIS — M25552 Pain in left hip: Secondary | ICD-10-CM | POA: Diagnosis not present

## 2018-02-01 DIAGNOSIS — F0390 Unspecified dementia without behavioral disturbance: Secondary | ICD-10-CM | POA: Diagnosis not present

## 2018-02-01 DIAGNOSIS — Z9181 History of falling: Secondary | ICD-10-CM | POA: Diagnosis not present

## 2018-02-01 DIAGNOSIS — R2689 Other abnormalities of gait and mobility: Secondary | ICD-10-CM | POA: Diagnosis not present

## 2018-02-03 DIAGNOSIS — M25552 Pain in left hip: Secondary | ICD-10-CM | POA: Diagnosis not present

## 2018-02-03 DIAGNOSIS — F0390 Unspecified dementia without behavioral disturbance: Secondary | ICD-10-CM | POA: Diagnosis not present

## 2018-02-03 DIAGNOSIS — R630 Anorexia: Secondary | ICD-10-CM | POA: Diagnosis not present

## 2018-02-03 DIAGNOSIS — R2689 Other abnormalities of gait and mobility: Secondary | ICD-10-CM | POA: Diagnosis not present

## 2018-02-03 DIAGNOSIS — Z9181 History of falling: Secondary | ICD-10-CM | POA: Diagnosis not present

## 2018-02-05 ENCOUNTER — Other Ambulatory Visit: Payer: Self-pay

## 2018-02-05 ENCOUNTER — Observation Stay (HOSPITAL_COMMUNITY)
Admission: EM | Admit: 2018-02-05 | Discharge: 2018-02-07 | Disposition: A | Discharge status: Skilled Nursing Facility | Payer: Medicare Other | Attending: Internal Medicine | Admitting: Internal Medicine

## 2018-02-05 ENCOUNTER — Encounter (HOSPITAL_COMMUNITY): Payer: Self-pay

## 2018-02-05 DIAGNOSIS — Z96642 Presence of left artificial hip joint: Secondary | ICD-10-CM | POA: Insufficient documentation

## 2018-02-05 DIAGNOSIS — I252 Old myocardial infarction: Secondary | ICD-10-CM | POA: Insufficient documentation

## 2018-02-05 DIAGNOSIS — N184 Chronic kidney disease, stage 4 (severe): Secondary | ICD-10-CM | POA: Diagnosis not present

## 2018-02-05 DIAGNOSIS — Z7902 Long term (current) use of antithrombotics/antiplatelets: Secondary | ICD-10-CM | POA: Insufficient documentation

## 2018-02-05 DIAGNOSIS — Z87891 Personal history of nicotine dependence: Secondary | ICD-10-CM | POA: Insufficient documentation

## 2018-02-05 DIAGNOSIS — D62 Acute posthemorrhagic anemia: Secondary | ICD-10-CM | POA: Diagnosis present

## 2018-02-05 DIAGNOSIS — Z955 Presence of coronary angioplasty implant and graft: Secondary | ICD-10-CM | POA: Insufficient documentation

## 2018-02-05 DIAGNOSIS — Z7901 Long term (current) use of anticoagulants: Secondary | ICD-10-CM | POA: Insufficient documentation

## 2018-02-05 DIAGNOSIS — N179 Acute kidney failure, unspecified: Secondary | ICD-10-CM | POA: Insufficient documentation

## 2018-02-05 DIAGNOSIS — K921 Melena: Secondary | ICD-10-CM | POA: Diagnosis not present

## 2018-02-05 DIAGNOSIS — K315 Obstruction of duodenum: Secondary | ICD-10-CM | POA: Insufficient documentation

## 2018-02-05 DIAGNOSIS — K2100 Gastro-esophageal reflux disease with esophagitis, without bleeding: Secondary | ICD-10-CM

## 2018-02-05 DIAGNOSIS — S72002A Fracture of unspecified part of neck of left femur, initial encounter for closed fracture: Secondary | ICD-10-CM | POA: Diagnosis present

## 2018-02-05 DIAGNOSIS — K2270 Barrett's esophagus without dysplasia: Secondary | ICD-10-CM | POA: Insufficient documentation

## 2018-02-05 DIAGNOSIS — Z79899 Other long term (current) drug therapy: Secondary | ICD-10-CM | POA: Insufficient documentation

## 2018-02-05 DIAGNOSIS — Z7982 Long term (current) use of aspirin: Secondary | ICD-10-CM | POA: Insufficient documentation

## 2018-02-05 DIAGNOSIS — F0390 Unspecified dementia without behavioral disturbance: Secondary | ICD-10-CM | POA: Diagnosis not present

## 2018-02-05 DIAGNOSIS — K269 Duodenal ulcer, unspecified as acute or chronic, without hemorrhage or perforation: Secondary | ICD-10-CM | POA: Diagnosis not present

## 2018-02-05 DIAGNOSIS — I2510 Atherosclerotic heart disease of native coronary artery without angina pectoris: Secondary | ICD-10-CM | POA: Insufficient documentation

## 2018-02-05 DIAGNOSIS — D72829 Elevated white blood cell count, unspecified: Secondary | ICD-10-CM | POA: Insufficient documentation

## 2018-02-05 DIAGNOSIS — E870 Hyperosmolality and hypernatremia: Secondary | ICD-10-CM | POA: Diagnosis not present

## 2018-02-05 DIAGNOSIS — Z79890 Hormone replacement therapy (postmenopausal): Secondary | ICD-10-CM | POA: Insufficient documentation

## 2018-02-05 DIAGNOSIS — K449 Diaphragmatic hernia without obstruction or gangrene: Secondary | ICD-10-CM | POA: Insufficient documentation

## 2018-02-05 DIAGNOSIS — K319 Disease of stomach and duodenum, unspecified: Secondary | ICD-10-CM | POA: Insufficient documentation

## 2018-02-05 DIAGNOSIS — K922 Gastrointestinal hemorrhage, unspecified: Secondary | ICD-10-CM | POA: Diagnosis not present

## 2018-02-05 DIAGNOSIS — Z85828 Personal history of other malignant neoplasm of skin: Secondary | ICD-10-CM | POA: Insufficient documentation

## 2018-02-05 DIAGNOSIS — N189 Chronic kidney disease, unspecified: Secondary | ICD-10-CM | POA: Diagnosis present

## 2018-02-05 LAB — PREPARE RBC (CROSSMATCH)

## 2018-02-05 LAB — CBC WITH DIFFERENTIAL/PLATELET
BASOS ABS: 0 10*3/uL (ref 0.0–0.1)
Basophils Relative: 0 %
EOS PCT: 1 %
Eosinophils Absolute: 0.3 10*3/uL (ref 0.0–0.7)
HEMATOCRIT: 22.1 % — AB (ref 39.0–52.0)
HEMOGLOBIN: 6.7 g/dL — AB (ref 13.0–17.0)
LYMPHS ABS: 3.7 10*3/uL (ref 0.7–4.0)
LYMPHS PCT: 15 %
MCH: 31.6 pg (ref 26.0–34.0)
MCHC: 30.3 g/dL (ref 30.0–36.0)
MCV: 104.2 fL — ABNORMAL HIGH (ref 78.0–100.0)
MONOS PCT: 5 %
Monocytes Absolute: 1.2 10*3/uL — ABNORMAL HIGH (ref 0.1–1.0)
Neutro Abs: 19.7 10*3/uL — ABNORMAL HIGH (ref 1.7–7.7)
Neutrophils Relative %: 79 %
Platelets: 351 10*3/uL (ref 150–400)
RBC: 2.12 MIL/uL — AB (ref 4.22–5.81)
RDW: 14.6 % (ref 11.5–15.5)
WBC: 24.9 10*3/uL — AB (ref 4.0–10.5)

## 2018-02-05 LAB — ABO/RH: ABO/RH(D): O POS

## 2018-02-05 LAB — COMPREHENSIVE METABOLIC PANEL
ALBUMIN: 2.6 g/dL — AB (ref 3.5–5.0)
ALT: 18 U/L (ref 17–63)
ANION GAP: 10 (ref 5–15)
AST: 42 U/L — AB (ref 15–41)
Alkaline Phosphatase: 54 U/L (ref 38–126)
BILIRUBIN TOTAL: 0.5 mg/dL (ref 0.3–1.2)
BUN: 72 mg/dL — AB (ref 6–20)
CO2: 19 mmol/L — ABNORMAL LOW (ref 22–32)
Calcium: 8.2 mg/dL — ABNORMAL LOW (ref 8.9–10.3)
Chloride: 117 mmol/L — ABNORMAL HIGH (ref 101–111)
Creatinine, Ser: 1.82 mg/dL — ABNORMAL HIGH (ref 0.61–1.24)
GFR calc Af Amer: 36 mL/min — ABNORMAL LOW (ref >60)
GFR, EST NON AFRICAN AMERICAN: 31 mL/min — AB (ref >60)
Glucose, Bld: 168 mg/dL — ABNORMAL HIGH (ref 65–99)
POTASSIUM: 4.2 mmol/L (ref 3.5–5.1)
Sodium: 146 mmol/L — ABNORMAL HIGH (ref 135–145)
TOTAL PROTEIN: 5.3 g/dL — AB (ref 6.5–8.1)

## 2018-02-05 LAB — POC OCCULT BLOOD, ED: FECAL OCCULT BLD: POSITIVE — AB

## 2018-02-05 LAB — I-STAT CHEM 8, ED
BUN: 70 mg/dL — ABNORMAL HIGH (ref 6–20)
CALCIUM ION: 1.28 mmol/L (ref 1.15–1.40)
Chloride: 117 mmol/L — ABNORMAL HIGH (ref 101–111)
Creatinine, Ser: 1.7 mg/dL — ABNORMAL HIGH (ref 0.61–1.24)
GLUCOSE: 161 mg/dL — AB (ref 65–99)
HCT: 19 % — ABNORMAL LOW (ref 39.0–52.0)
HEMOGLOBIN: 6.5 g/dL — AB (ref 13.0–17.0)
POTASSIUM: 4.3 mmol/L (ref 3.5–5.1)
SODIUM: 148 mmol/L — AB (ref 135–145)
TCO2: 18 mmol/L — ABNORMAL LOW (ref 22–32)

## 2018-02-05 MED ORDER — HYDROCODONE-ACETAMINOPHEN 5-325 MG PO TABS: 1.0000 | ORAL_TABLET | Freq: Four times a day (QID) | ORAL | Status: DC | PRN

## 2018-02-05 MED ORDER — LIDOCAINE 5 % EX PTCH
1.0000 | MEDICATED_PATCH | Freq: Every day | CUTANEOUS | Status: DC
  Administered 2018-02-06 – 2018-02-07 (×2): 1 via TRANSDERMAL
  Filled 2018-02-05 (×2): qty 1

## 2018-02-05 MED ORDER — AMLODIPINE BESYLATE 10 MG PO TABS
10.0000 mg | ORAL_TABLET | Freq: Every day | ORAL | Status: DC
  Administered 2018-02-06 – 2018-02-07 (×2): 10 mg via ORAL
  Filled 2018-02-05 (×2): qty 1

## 2018-02-05 MED ORDER — MIRTAZAPINE 15 MG PO TABS
7.5000 mg | ORAL_TABLET | Freq: Every day | ORAL | Status: DC
  Administered 2018-02-05 – 2018-02-06 (×2): 7.5 mg via ORAL
  Filled 2018-02-05 (×2): qty 1

## 2018-02-05 MED ORDER — NITROGLYCERIN 0.4 MG SL SUBL: 0.4000 mg | SUBLINGUAL_TABLET | SUBLINGUAL | Status: DC | PRN

## 2018-02-05 MED ORDER — ONDANSETRON HCL 4 MG/2ML IJ SOLN: 4.0000 mg | Freq: Four times a day (QID) | INTRAMUSCULAR | Status: DC | PRN

## 2018-02-05 MED ORDER — ONDANSETRON HCL 4 MG PO TABS: 4.0000 mg | ORAL_TABLET | Freq: Four times a day (QID) | ORAL | Status: DC | PRN

## 2018-02-05 MED ORDER — TRAMADOL HCL 50 MG PO TABS: 25.0000 mg | ORAL_TABLET | Freq: Two times a day (BID) | ORAL | Status: DC | PRN

## 2018-02-05 MED ORDER — ATORVASTATIN CALCIUM 80 MG PO TABS
80.0000 mg | ORAL_TABLET | Freq: Every day | ORAL | Status: DC
  Administered 2018-02-05 – 2018-02-06 (×2): 80 mg via ORAL
  Filled 2018-02-05 (×2): qty 1

## 2018-02-05 MED ORDER — SODIUM CHLORIDE 0.9 % IV SOLN
10.0000 mL/h | Freq: Once | INTRAVENOUS | Status: AC
  Administered 2018-02-05: 10 mL/h via INTRAVENOUS

## 2018-02-05 MED ORDER — ACETAMINOPHEN 500 MG PO TABS: 1000.0000 mg | ORAL_TABLET | Freq: Three times a day (TID) | ORAL | Status: DC | PRN

## 2018-02-05 MED ORDER — LEVOTHYROXINE SODIUM 25 MCG PO TABS
25.0000 ug | ORAL_TABLET | Freq: Every day | ORAL | Status: DC
  Administered 2018-02-06 – 2018-02-07 (×2): 25 ug via ORAL
  Filled 2018-02-05 (×2): qty 1

## 2018-02-05 MED ORDER — DONEPEZIL HCL 5 MG PO TABS
5.0000 mg | ORAL_TABLET | Freq: Every day | ORAL | Status: DC
  Administered 2018-02-05 – 2018-02-06 (×2): 5 mg via ORAL
  Filled 2018-02-05 (×2): qty 1

## 2018-02-05 MED ORDER — OXYCODONE HCL 5 MG PO TABS
5.0000 mg | ORAL_TABLET | ORAL | Status: DC | PRN
  Administered 2018-02-05 – 2018-02-07 (×2): 5 mg via ORAL
  Filled 2018-02-05 (×2): qty 1

## 2018-02-05 MED ORDER — SODIUM CHLORIDE 0.9% FLUSH
3.0000 mL | Freq: Two times a day (BID) | INTRAVENOUS | Status: DC
  Administered 2018-02-05 (×2): 3 mL via INTRAVENOUS
  Administered 2018-02-06: 5 mL via INTRAVENOUS
  Administered 2018-02-06: 3 mL via INTRAVENOUS

## 2018-02-05 MED ORDER — CARVEDILOL 12.5 MG PO TABS
12.5000 mg | ORAL_TABLET | Freq: Two times a day (BID) | ORAL | Status: DC
  Administered 2018-02-05 – 2018-02-07 (×3): 12.5 mg via ORAL
  Filled 2018-02-05 (×4): qty 1

## 2018-02-05 NOTE — ED Provider Notes (Signed)
[facility] EMERGENCY DEPARTMENT Provider Note   CSN: [id number] Arrival date & time: 02/05/18  1117     History   Chief Complaint Chief Complaint  Patient presents with  . Rectal Bleeding    HPI LENY MOROZOV is a 84 y.o. male.  HPI  Patient is an 84 year old male with a history of an STEMI, CAD, basal cell carcinoma, and status post left hip arthroplasty in June 2019 presenting for tarry stools.  Patient is currently at [facility] rehabilitation, and he had a large, black-colored bowel movement this morning noted by nursing staff.  Patient denies seeing this prior to this morning.  Patient does note that he had a history of GI bleeding provoked by aspirin many years ago.  Patient reports feeling increased shortness of breath while doing his physical rehabilitation.  Patient denies any chest pain or abdominal pain.  Patient denies syncope.  Patient is currently anticoagulated on Plavix and daily Lovenox injections while in rehab.  Past Medical History:  Diagnosis Date  . Basal cell carcinoma (BCC) of face 10/11/2017  . NSTEMI (non-ST elevation myocardial infarction) ([place]) 01/2016   a.3.0 x 38 Synergy DES to mid RCA 01/2016  . Vertigo     Patient Active Problem List   Diagnosis Date Noted  . Closed displaced fracture of left femoral neck ([place]) 01/26/2018  . Closed left hip fracture ([place]) 01/24/2018  . Basal cell carcinoma (BCC) of face 10/11/2017  . Dementia 07/05/2016  . CKD (chronic kidney disease) 02/26/2016  . History of GI bleed 02/26/2016  . NSTEMI (non-ST elevated myocardial infarction) ([place]) 02/17/2016    Past Surgical History:  Procedure Laterality Date  . CARDIAC CATHETERIZATION N/A 02/17/2016   Procedure: Left Heart Cath and Coronary Angiography;  Surgeon: Jettie Booze, MD;  Location: [facility] CV LAB;  Service: Cardiovascular;  Laterality: N/A;  . CARDIAC CATHETERIZATION N/A 02/17/2016   Procedure: Coronary Stent Intervention;  Surgeon:  Jettie Booze, MD;  Location: [place] [facility] CV LAB;  Service: Cardiovascular;  Laterality: N/A;  . HERNIA REPAIR    . TOTAL HIP ARTHROPLASTY Left 01/26/2018   Procedure: LEFT TOTAL HIP ARTHROPLASTY ANTERIOR APPROACH;  Surgeon: Rod Can, MD;  Location: [facility] ORS;  Service: Orthopedics;  Laterality: Left;  Needs RNFA        Home Medications    Prior to Admission medications   Medication Sig Start Date End Date Taking? Authorizing Provider  acetaminophen (TYLENOL) 500 MG tablet Take 1,000 mg by mouth every 8 (eight) hours as needed for mild pain.    Yes [provider]  amLODipine (NORVASC) 10 MG tablet Take 1 tablet (10 mg total) by mouth daily. 07/05/16  Yes Imogene Burn, PA-C  atorvastatin (LIPITOR) 80 MG tablet TAKE 1 TABLET BY MOUTH EVERY DAY AT [facility] 02/01/18  Yes Josue Hector, MD  carvedilol (COREG) 12.5 MG tablet TAKE 1 TABLET (12.5 MG TOTAL) BY MOUTH 2 (TWO) TIMES DAILY WITH A MEAL. 10/11/17  Yes Josue Hector, MD  clopidogrel (PLAVIX) 75 MG tablet TAKE 1 TABLET BY MOUTH EVERY DAY 11/15/17  Yes Josue Hector, MD  donepezil (ARICEPT) 5 MG tablet Take 5 mg by mouth at bedtime.   Yes [provider]  enoxaparin (LOVENOX) 30 MG/0.3ML injection Inject 0.3 mLs (30 mg total) into the skin daily. 01/28/18  Yes Swinteck, Aaron Edelman, MD  levothyroxine (SYNTHROID, LEVOTHROID) 25 MCG tablet Take 25 mcg by mouth daily before breakfast.   Yes [provider]  Lidocaine  4 % PTCH Apply 1 patch topically 2 (two) times daily.   Yes [provider]  mirtazapine (REMERON) 15 MG tablet Take 7.5 mg by mouth at bedtime.   Yes [provider]  nitroGLYCERIN (NITROSTAT) 0.4 MG SL tablet Place 1 tablet (0.4 mg total) under the tongue every 5 (five) minutes x 3 doses as needed for chest pain. 11/07/17  Yes Josue Hector, MD  traMADol (ULTRAM) 50 MG tablet Take 25 mg by mouth every 8 (eight) hours as needed for moderate pain.   Yes [provider]    aspirin EC 81 MG EC tablet Take 1 tablet (81 mg total) by mouth daily. 02/18/16   Arbutus Leas, NP  HYDROcodone-acetaminophen (NORCO/VICODIN) 5-325 MG tablet Take 1-2 tablets by mouth every 6 (six) hours as needed (postop hip pain). 01/27/18   Swinteck, Aaron Edelman, MD    Family History No family history on file.  Social History Social History   Tobacco Use  . Smoking status: Former Research scientist (life sciences)  . Smokeless tobacco: Never Used  . Tobacco comment: QUIT SMOKING IN THE LATE 60'S  Substance Use Topics  . Alcohol use: No  . Drug use: No     Allergies   Aspirin   Review of Systems Review of Systems  Constitutional: Negative for chills and fever.  HENT: Negative for congestion and rhinorrhea.   Respiratory: Positive for shortness of breath. Negative for chest tightness.   Cardiovascular: Negative for chest pain and leg swelling.  Gastrointestinal: Negative for abdominal pain, nausea and vomiting.  Skin: Positive for pallor.  Neurological: Negative for syncope, weakness, light-headedness and numbness.  All other systems reviewed and are negative.    Physical Exam Updated Vital Signs BP 103/62   Pulse 86   Temp 98.5 F (36.9 C) (Rectal)   Resp 20   Ht 5\' 2"  (1.575 m)   Wt 55.8 kg (123 lb)   SpO2 100%   BMI 22.50 kg/m   Physical Exam  Constitutional:  Pale, thin, and appearing poorly nourished.  HENT:  Head: Normocephalic and atraumatic.  Eyes: Pupils are equal, round, and reactive to light. EOM are normal.  Pale conjunctiva.  Neck: Normal range of motion. Neck supple.  Cardiovascular: Normal rate, regular rhythm, S1 normal, S2 normal and intact distal pulses.  No murmur heard. Pulmonary/Chest: Effort normal.  Coarse crackles in bilateral lung bases.  Abdominal: Soft. He exhibits no distension. There is no tenderness. There is no guarding.  Genitourinary: Rectal exam shows guaiac positive stool.  Genitourinary Comments: Examination performed with RN chaperone present.   Patient has frank melena present around the anus.  Normal rectal tone.  No masses of rectal vault.  No active bleeding.  Musculoskeletal: Normal range of motion. He exhibits no edema or deformity.  Neurological: He is alert.  Cranial nerves grossly intact. Patient moves extremities symmetrically and with good coordination.  Skin: Skin is warm and dry. No rash noted. No erythema.  Psychiatric: He has a normal mood and affect. His behavior is normal. Judgment and thought content normal.  Nursing note and vitals reviewed.    ED Treatments / Results  Labs (all labs ordered are listed, but only abnormal results are displayed) Labs Reviewed  POC OCCULT BLOOD, ED - Abnormal; Notable for the following components:      Result Value   Fecal Occult Bld POSITIVE (*)    All other components within normal limits  COMPREHENSIVE METABOLIC PANEL  CBC WITH DIFFERENTIAL/PLATELET  I-STAT CHEM 8, ED  TYPE AND SCREEN  PREPARE RBC (CROSSMATCH)    EKG EKG Interpretation  Date/Time:  Sunday February 05 2018 11:29:30 EDT Ventricular Rate:  85 PR Interval:    QRS Duration: 81 QT Interval:  372 QTC Calculation: 443 R Axis:   -28 Text Interpretation:  Sinus rhythm Borderline left axis deviation Low voltage, precordial leads Nonspecific T abnormalities, diffuse leads No significant change since last tracing Confirmed by Blanchie Dessert [id number]) on 02/05/2018 12:12:27 PM   Radiology No results found.  Procedures Procedures (including critical care time)  CRITICAL CARE Performed by: Albesa Seen   Total critical care time: 35 minutes  Critical care time was exclusive of separately billable procedures and treating other patients.  Critical care was necessary to treat or prevent imminent or life-threatening deterioration.  Critical care was time spent personally by me on the following activities: development of treatment plan with patient and/or surrogate as well as nursing, discussions with  consultants, evaluation of patient's response to treatment, examination of patient, obtaining history from patient or surrogate, ordering and performing treatments and interventions, ordering and review of laboratory studies, ordering and review of radiographic studies, pulse oximetry and re-evaluation of patient's condition.   Medications Ordered in ED Medications  0.9 %  sodium chloride infusion (has no administration in time range)     Initial Impression / Assessment and Plan / ED Course  I have reviewed the triage vital signs and the nursing notes.  Pertinent labs & imaging results that were available during my care of the patient were reviewed by me and considered in my medical decision making (see chart for details).  Clinical Course as of Feb 05 1500  Sun Feb 05, 2018  1236 Reassessed patient.  Hemodynamically stable, although BPs soft.  Initiating blood transfusion shortly.   [AM]  1417 Spoke with Dr. Evangeline Gula of [facility] regarding admission for GI bleed.  Appreciate [facility] involvement in the care of this patient.   [AM]  1441 Spoke with Dr. Benson Norway of gastroenterology, who will follow patient case.  Appreciate gastroenterology involvement in the care of this patient.   [AM]    Clinical Course User Index [AM] Albesa Seen, PA-C    Patient is pale appearing, but hemodynamically stable.  Patient has frank melena on exam.  Hemoglobin is 6.5.  Transfusion initiated.  Suspect this may be of upper GI source given high elevation of BUN at 72.  This is greater than patient's prior.  Creatinine overall at baseline (see below for trend).  Lab Results  Component Value Date   CREATININE 1.70 (H) 02/05/2018   CREATININE 1.82 (H) 02/05/2018   CREATININE 1.59 (H) 01/28/2018   This is a shared visit with Dr. Blanchie Dessert. Patient was independently evaluated by this attending physician. Attending physician consulted in evaluation and admission management.   Final  Clinical Impressions(s) / ED Diagnoses   Final diagnoses:  Gastrointestinal hemorrhage, unspecified gastrointestinal hemorrhage type    ED Discharge Orders    None       Tamala Julian 02/05/18 1503    Blanchie Dessert, MD 02/05/18 1559

## 2018-02-05 NOTE — H&P (View-Only) (Signed)
Consult Note for [facility]  Reason for Consult: Anemia and melena Referring Physician: [facility]  Livingston Diones Sperry HPI: This is an 84 year old male with a PMH of CAD, s/p NSTEMI 01/2016 with DES to RCA, basal cell carcinoma, remote history of a GI bleed, dementia, and recent left total hip arthroplasty admitted for melenic stools.  The patient fell in early June and suffered a displaced left femoral neck fracture and he underwent an anterior left hip hemiarthroplasty on 01/26/2018.  The patient was placed on Lovenex and maintained on Plavix.  While at [facility] for his rehab he was noted to have a large black bowel movement.  The patient was feeling weak a the time.  With his remote history of GI bleed, it is reported that it was precipitated by the use of ASA.  Past Medical History:  Diagnosis Date  . Basal cell carcinoma (BCC) of face 10/11/2017  . NSTEMI (non-ST elevation myocardial infarction) ([place]) 01/2016   a.3.0 x 38 Synergy DES to mid RCA 01/2016  . Vertigo     Past Surgical History:  Procedure Laterality Date  . CARDIAC CATHETERIZATION N/A 02/17/2016   Procedure: Left Heart Cath and Coronary Angiography;  Surgeon: Jettie Booze, MD;  Location: [place] [facility];  Service: Cardiovascular;  Laterality: N/A;  . CARDIAC CATHETERIZATION N/A 02/17/2016   Procedure: Coronary Stent Intervention;  Surgeon: Jettie Booze, MD;  Location: [place] [facility];  Service: Cardiovascular;  Laterality: N/A;  . HERNIA REPAIR    . TOTAL HIP ARTHROPLASTY Left 01/26/2018   Procedure: LEFT TOTAL HIP ARTHROPLASTY ANTERIOR APPROACH;  Surgeon: Rod Can, MD;  Location: [facility];  Service: Orthopedics;  Laterality: Left;  Needs RNFA    No family history on file.  Social History:  reports that he has quit smoking. He has never used smokeless tobacco. He reports that he does not drink alcohol or use drugs.  Allergies:  Allergies  Allergen Reactions  . Aspirin Other (See Comments)   Stomach bleeds     Medications:  Scheduled: . [START ON 02/06/2018] amLODipine  10 mg Oral Daily  . atorvastatin  80 mg Oral q1800  . carvedilol  12.5 mg Oral BID WC  . donepezil  5 mg Oral QHS  . [START ON 02/06/2018] levothyroxine  25 mcg Oral QAC breakfast  . [START ON 02/06/2018] lidocaine  1 patch Transdermal Daily  . mirtazapine  7.5 mg Oral QHS  . sodium chloride flush  3 mL Intravenous Q12H   Continuous:   Results for orders placed or performed during the hospital encounter of 02/05/18 (from the past 24 hour(s))  Comprehensive metabolic panel     Status: Abnormal   Collection Time: 02/05/18 11:40 AM  Result Value Ref Range   Sodium 146 (H) 135 - 145 mmol/L   Potassium 4.2 3.5 - 5.1 mmol/L   Chloride 117 (H) 101 - 111 mmol/L   CO2 19 (L) 22 - 32 mmol/L   Glucose, Bld 168 (H) 65 - 99 mg/dL   BUN 72 (H) 6 - 20 mg/dL   Creatinine, Ser 1.82 (H) 0.61 - 1.24 mg/dL   Calcium 8.2 (L) 8.9 - 10.3 mg/dL   Total Protein 5.3 (L) 6.5 - 8.1 g/dL   Albumin 2.6 (L) 3.5 - 5.0 g/dL   AST 42 (H) 15 - 41 U/L   ALT 18 17 - 63 U/L   Alkaline Phosphatase 54 38 - 126 U/L   Total Bilirubin 0.5 0.3 -  1.2 mg/dL   GFR calc non Af Amer 31 (L) >60 mL/min   GFR calc Af Amer 36 (L) >60 mL/min   Anion gap 10 5 - 15  CBC with Differential     Status: Abnormal   Collection Time: 02/05/18 11:40 AM  Result Value Ref Range   WBC 24.9 (H) 4.0 - 10.5 K/uL   RBC 2.12 (L) 4.22 - 5.81 MIL/uL   Hemoglobin 6.7 (LL) 13.0 - 17.0 g/dL   HCT 22.1 (L) 39.0 - 52.0 %   MCV 104.2 (H) 78.0 - 100.0 fL   MCH 31.6 26.0 - 34.0 pg   MCHC 30.3 30.0 - 36.0 g/dL   RDW 14.6 11.5 - 15.5 %   Platelets 351 150 - 400 K/uL   Neutrophils Relative % 79 %   Lymphocytes Relative 15 %   Monocytes Relative 5 %   Eosinophils Relative 1 %   Basophils Relative 0 %   Neutro Abs 19.7 (H) 1.7 - 7.7 K/uL   Lymphs Abs 3.7 0.7 - 4.0 K/uL   Monocytes Absolute 1.2 (H) 0.1 - 1.0 K/uL   Eosinophils Absolute 0.3 0.0 - 0.7 K/uL   Basophils  Absolute 0.0 0.0 - 0.1 K/uL   RBC Morphology POLYCHROMASIA PRESENT    WBC Morphology TOXIC GRANULATION   POC occult blood, ED     Status: Abnormal   Collection Time: 02/05/18 11:48 AM  Result Value Ref Range   Fecal Occult Bld POSITIVE (A) NEGATIVE  Type and screen [facility] [place]     Status: None (Preliminary result)   Collection Time: 02/05/18 11:50 AM  Result Value Ref Range   ABO/RH(D) O POS    Antibody Screen NEG    Sample Expiration 02/08/2018    Unit Number [id number]    Blood Component Type RED CELLS,LR    Unit division 00    Status of Unit ISSUED    Transfusion Status OK TO TRANSFUSE    Crossmatch Result      Compatible Performed at [facility], [address], [place], [place] [address]    Unit Number [id number]    Blood Component Type RED CELLS,LR    Unit division 00    Status of Unit ALLOCATED    Transfusion Status OK TO TRANSFUSE    Crossmatch Result Compatible   ABO/Rh     Status: None   Collection Time: 02/05/18 11:50 AM  Result Value Ref Range   ABO/RH(D)      O POS Performed at [facility] [address] [place], [place] [address]   I-stat Chem 8, ED     Status: Abnormal   Collection Time: 02/05/18 12:17 PM  Result Value Ref Range   Sodium 148 (H) 135 - 145 mmol/L   Potassium 4.3 3.5 - 5.1 mmol/L   Chloride 117 (H) 101 - 111 mmol/L   BUN 70 (H) 6 - 20 mg/dL   Creatinine, Ser 1.70 (H) 0.61 - 1.24 mg/dL   Glucose, Bld 161 (H) 65 - 99 mg/dL   Calcium, Ion 1.28 1.15 - 1.40 mmol/L   TCO2 18 (L) 22 - 32 mmol/L   Hemoglobin 6.5 (LL) 13.0 - 17.0 g/dL   HCT 19.0 (L) 39.0 - 52.0 %   Comment NOTIFIED PHYSICIAN   Prepare RBC     Status: None   Collection Time: 02/05/18 12:25 PM  Result Value Ref Range   Order Confirmation      ORDER PROCESSED BY BLOOD BANK Performed  at [facility], [place] [address], [place], [place] [address]      No results found.  ROS:  As stated above in the HPI otherwise negative.  Blood  pressure 122/62, pulse 86, temperature 98 F (36.7 C), temperature source Oral, resp. rate (!) 23, height 5\' 2"  (1.575 m), weight 55.8 kg (123 lb), SpO2 98 %.    PE: Gen: NAD, Alert and Oriented HEENT:  [place]/AT, EOMI Neck: Supple, no LAD Lungs: CTA Bilaterally CV: RRR without M/G/R ABM: Soft, NTND, +BS Ext: No C/C/E  Assessment/Plan: 1) Probable UG Bleed. 2) Anemia. 3) CAD. 4) S/p left hip arthroplasty.   The patient appears to have an UGI bleed.  His baseline HGB ranges from 10-13 g/dL and his admission value was at 6.5 g/dL.  He is hemodynamically stable.  After exiting his room a family member reported that he has had issues with GERD for the past 50 years and he takes Rolaids on a semi-routine basis.  Plan: 1) EGD tomorrow with Dr. Fuller Plan. 2) Agree with blood transfusions. 3) Follow HGB.  Lafawn Lenoir D 02/05/2018, 4:50 PM

## 2018-02-05 NOTE — H&P (Signed)
History and Physical    Daniel Schmitt ZOX:[id number] DOB: 1926/04/18 DOA: 02/05/2018  PCP: [name], MD Patient coming from: [facility] and rehab  I have personally briefly reviewed patient's old medical records in [place]  Chief Complaint: Rectal bleeding noted this morning  HPI: Daniel Schmitt is a 84 y.o. male with medical history significant of recent hip fracture with left total hip replacement earlier this month, history of myocardial infarction, coronary artery disease, basal cell carcinoma, and mild dementia who is currently at [facility] rehab due to rehabilitation for his hip fracture.  He was discharged from our facility on 01/28/18 on Plavix and Lovenox.  A he had a large black-colored bowel movement noted by nursing staff.  He denies seeing this prior to this morning.  He did have a history of GI bleeding provoked by aspirin many years ago and has not had aspirin in many years.  Aspirin is listed on his discharge medication but he has not been receiving it.  He is having some increased shortness of breath while doing his physical rehabilitation recently but denies any chest pain or abdominal pain.  He denies syncope.  He denies headache or blurry vision.  He denies dysuria urinary frequency or urgency.  He has been receiving Plavix due to a history of coronary disease.  He has been on the Lovenox for DVT prophylaxis.   ED Course: Found to be 6.5.  Patient has frank melena.  Transfusion has been ordered for 2 units of packed red blood cells.  GI consulted.  Review of Systems: As per HPI otherwise all other systems reviewed and  negative.   Past Medical History:  Diagnosis Date  . Basal cell carcinoma (BCC) of face 10/11/2017  . NSTEMI (non-ST elevation myocardial infarction) ([place]) 01/2016   a.3.0 x 38 Synergy DES to mid RCA 01/2016  . Vertigo     Past Surgical History:  Procedure Laterality Date  . CARDIAC CATHETERIZATION N/A 02/17/2016   Procedure: Left Heart Cath  and Coronary Angiography;  Surgeon: Jettie Booze, MD;  Location: [place] [facility];  Service: Cardiovascular;  Laterality: N/A;  . CARDIAC CATHETERIZATION N/A 02/17/2016   Procedure: Coronary Stent Intervention;  Surgeon: Jettie Booze, MD;  Location: [place] [facility];  Service: Cardiovascular;  Laterality: N/A;  . HERNIA REPAIR    . TOTAL HIP ARTHROPLASTY Left 01/26/2018   Procedure: LEFT TOTAL HIP ARTHROPLASTY ANTERIOR APPROACH;  Surgeon: Rod Can, MD;  [facility];  Service: Orthopedics;  Laterality: Left;  Needs RNFA    Social History   Social History Narrative  . Not on file     reports that he has quit smoking. He has never used smokeless tobacco. He reports that he does not drink alcohol or use drugs.  Allergies  Allergen Reactions  . Aspirin Other (See Comments)    Stomach bleeds     No family history on file. His daughter is alive and well.  Patient cannot remember his family history due to his dementia.  Difficulty with anesthesia in his family is recorded.  His wife was recently diagnosed with stage IV appendiceal cancer.  She is being treated at [facility].  Prior to Admission medications   Medication Sig Start Date End Date Taking? Authorizing Provider  acetaminophen (TYLENOL) 500 MG tablet Take 1,000 mg by mouth every 8 (eight) hours as needed for mild pain.    Yes [provider]  amLODipine (NORVASC) 10 MG tablet Take 1 tablet (  10 mg total) by mouth daily. 07/05/16  Yes Imogene Burn, PA-C  atorvastatin (LIPITOR) 80 MG tablet TAKE 1 TABLET BY MOUTH EVERY DAY AT [facility] 02/01/18  Yes Josue Hector, MD  carvedilol (COREG) 12.5 MG tablet TAKE 1 TABLET (12.5 MG TOTAL) BY MOUTH 2 (TWO) TIMES DAILY WITH A MEAL. 10/11/17  Yes Josue Hector, MD  clopidogrel (PLAVIX) 75 MG tablet TAKE 1 TABLET BY MOUTH EVERY DAY 11/15/17  Yes Josue Hector, MD  donepezil (ARICEPT) 5 MG tablet Take 5 mg by mouth at bedtime.   Yes [provider]    enoxaparin (LOVENOX) 30 MG/0.3ML injection Inject 0.3 mLs (30 mg total) into the skin daily. 01/28/18  Yes Swinteck, Aaron Edelman, MD  HYDROcodone-acetaminophen (NORCO/VICODIN) 5-325 MG tablet Take 1-2 tablets by mouth every 6 (six) hours as needed (postop hip pain). 01/27/18  Yes Swinteck, Aaron Edelman, MD  levothyroxine (SYNTHROID, LEVOTHROID) 25 MCG tablet Take 25 mcg by mouth daily before breakfast.   Yes [provider]  Lidocaine 4 % PTCH Apply 1 patch topically 2 (two) times daily.   Yes [provider]  mirtazapine (REMERON) 15 MG tablet Take 7.5 mg by mouth at bedtime.   Yes [provider]  nitroGLYCERIN (NITROSTAT) 0.4 MG SL tablet Place 1 tablet (0.4 mg total) under the tongue every 5 (five) minutes x 3 doses as needed for chest pain. 11/07/17  Yes Josue Hector, MD  traMADol (ULTRAM) 50 MG tablet Take 25 mg by mouth every 8 (eight) hours as needed for moderate pain.   Yes [provider]    Physical Exam:  Constitutional: NAD, calm, comfortable, very pale Vitals:   02/05/18 1145 02/05/18 1200 02/05/18 1343 02/05/18 1401  BP: 97/66 103/62 121/64 122/62  Pulse: 76 86 86 86  Resp: 20 20 (!) 23 (!) 23  Temp:   97.7 F (36.5 C) 98 F (36.7 C)  TempSrc:   Oral Oral  SpO2: 98% 100% 99% 98%  Weight:      Height:       Eyes: PERRL, lids and conjunctivae pale, sclera white ENMT: Mucous membranes are moist. Posterior pharynx clear of any exudate or lesions.Normal dentition.  Neck: normal, supple, no masses, no thyromegaly Respiratory: clear to auscultation bilaterally, no wheezing, no crackles. Normal respiratory effort. No accessory muscle use.  Cardiovascular: Regular rate and rhythm, 2/6 flow murmur, no rubs / gallops. No extremity edema. 2+ pedal pulses. No carotid bruits.  Abdomen: no tenderness, no masses palpated. No hepatosplenomegaly. Bowel sounds positive.  Musculoskeletal: no clubbing / cyanosis. No joint deformity upper and lower extremities. Good  ROM, no contractures. Normal muscle tone.  Skin: no rashes, lesions, ulcers. No induration Neurologic: CN 2-12 grossly intact. Sensation intact, DTR normal. Strength 5/5 in all 4.  Psychiatric: Normal judgment and insight. Alert and oriented x 3. Normal mood.     Labs on Admission: I have personally reviewed following labs and imaging studies  CBC: Recent Labs  Lab 02/05/18 1140 02/05/18 1217  WBC 24.9*  --   NEUTROABS 19.7*  --   HGB 6.7* 6.5*  HCT 22.1* 19.0*  MCV 104.2*  --   PLT 351  --    Basic Metabolic Panel: Recent Labs  Lab 02/05/18 1140 02/05/18 1217  NA 146* 148*  K 4.2 4.3  CL 117* 117*  CO2 19*  --   GLUCOSE 168* 161*  BUN 72* 70*  CREATININE 1.82* 1.70*  CALCIUM 8.2*  --    GFR: Estimated  Creatinine Clearance: 22.8 mL/min (A) (by C-G formula based on SCr of 1.7 mg/dL (H)). Liver Function Tests: Recent Labs  Lab 02/05/18 1140  AST 42*  ALT 18  ALKPHOS 54  BILITOT 0.5  PROT 5.3*  ALBUMIN 2.6*   Urine analysis:    Component Value Date/Time   COLORURINE YELLOW 02/16/2016 2058   APPEARANCEUR CLEAR 02/16/2016 2058   LABSPEC 1.025 02/16/2016 2058   [place] 5.0 02/16/2016 2058   GLUCOSEU NEGATIVE 02/16/2016 2058   HGBUR NEGATIVE 02/16/2016 2058   BILIRUBINUR NEGATIVE 02/16/2016 2058   [place] NEGATIVE 02/16/2016 2058   PROTEINUR NEGATIVE 02/16/2016 2058   UROBILINOGEN 0.2 02/11/2013 1353   NITRITE NEGATIVE 02/16/2016 2058   LEUKOCYTESUR NEGATIVE 02/16/2016 2058    Radiological Exams on Admission: No results found.  EKG: Independently reviewed.  Sinus rhythm with borderline left axis deviation no change when compared to prior.  Assessment/Plan Principal Problem:   GI bleed Active Problems:   Acute blood loss anemia   CKD (chronic kidney disease)   Dementia   Closed displaced fracture of left femoral neck (HCC)    1.  GI bleeding: Patient will be admitted into the hospital transfused 2 units of packed red blood cells.  We will  stop Lovenox and Plavix for now.  Would consider restarting Plavix once patient more stable.  Will consult GI for further evaluation.  2.  Acute blood loss anemia: Hemoglobin was 10 prior to surgery he was discharged with a hemoglobin of 8 now presents with a hemoglobin of 6.5.  He is visibly short of breath.  He will transfused 2 units of packed red blood cells.  Will consult GI as noted above.  3.  Chronic kidney disease stage IV with a GFR of approximately 22: Avoid nephrotoxic agents.  Supportive care.  4.  Dementia: Continue supportive care.  Would refer to outpatient physician for possible medications if felt would benefit.  5.  Closed displaced fracture of left femoral neck with recent repair.  Continue rehab once discharged from the hospital.  DVT prophylaxis: SCDs Code Status: Full code: I did suggest that the daughter reconsider his CODE STATUS given his recent history. Family Communication: Spoke with patient's daughter who was present by phone Disposition Plan: Possible discharge in a.m. Consults called: Gastroenterology Admission status: Observation   Lady Deutscher MD FACP [facility] Pager [phone]  If 7PM-7AM, please contact night-coverage [email or web address] Password [email or web address]  02/05/2018, 2:45 PM

## 2018-02-05 NOTE — Consult Note (Signed)
Consult Note for Daniel Schmitt  Reason for Consult: Anemia and melena Referring Physician: [facility]  Livingston Diones Mendiola HPI: This is an 84 year old male with a PMH of CAD, s/p NSTEMI 01/2016 with DES to RCA, basal cell carcinoma, remote history of a Schmitt bleed, dementia, and recent left total hip arthroplasty admitted for melenic stools.  The patient fell in early June and suffered a displaced left femoral neck fracture and he underwent an anterior left hip hemiarthroplasty on 01/26/2018.  The patient was placed on Lovenex and maintained on Plavix.  While at [facility] for his rehab he was noted to have a large black bowel movement.  The patient was feeling weak a the time.  With his remote history of Schmitt bleed, it is reported that it was precipitated by the use of ASA.  Past Medical History:  Diagnosis Date  . Basal cell carcinoma (BCC) of face 10/11/2017  . NSTEMI (non-ST elevation myocardial infarction) ([place]) 01/2016   a.3.0 x 38 Synergy DES to mid RCA 01/2016  . Vertigo     Past Surgical History:  Procedure Laterality Date  . CARDIAC CATHETERIZATION N/A 02/17/2016   Procedure: Left Heart Cath and Coronary Angiography;  Surgeon: Jettie Booze, MD;  Location: [place] [facility];  Service: Cardiovascular;  Laterality: N/A;  . CARDIAC CATHETERIZATION N/A 02/17/2016   Procedure: Coronary Stent Intervention;  Surgeon: Jettie Booze, MD;  Location: [place] [facility];  Service: Cardiovascular;  Laterality: N/A;  . HERNIA REPAIR    . TOTAL HIP ARTHROPLASTY Left 01/26/2018   Procedure: LEFT TOTAL HIP ARTHROPLASTY ANTERIOR APPROACH;  Surgeon: Rod Can, MD;  [facility]: [facility];  Service: Orthopedics;  Laterality: Left;  Needs RNFA    No family history on file.  Social History:  reports that he has quit smoking. He has never used smokeless tobacco. He reports that he does not drink alcohol or use drugs.  Allergies:  Allergies  Allergen Reactions  . Aspirin Other (See Comments)   Stomach bleeds     Medications:  Scheduled: . [START ON 02/06/2018] amLODipine  10 mg Oral Daily  . atorvastatin  80 mg Oral q1800  . carvedilol  12.5 mg Oral BID WC  . donepezil  5 mg Oral QHS  . [START ON 02/06/2018] levothyroxine  25 mcg Oral QAC breakfast  . [START ON 02/06/2018] lidocaine  1 patch Transdermal Daily  . mirtazapine  7.5 mg Oral QHS  . sodium chloride flush  3 mL Intravenous Q12H   Continuous:   Results for orders placed or performed during the hospital encounter of 02/05/18 (from the past 24 hour(s))  Comprehensive metabolic panel     Status: Abnormal   Collection Time: 02/05/18 11:40 AM  Result Value Ref Range   Sodium 146 (H) 135 - 145 mmol/L   Potassium 4.2 3.5 - 5.1 mmol/L   Chloride 117 (H) 101 - 111 mmol/L   CO2 19 (L) 22 - 32 mmol/L   Glucose, Bld 168 (H) 65 - 99 mg/dL   BUN 72 (H) 6 - 20 mg/dL   Creatinine, Ser 1.82 (H) 0.61 - 1.24 mg/dL   Calcium 8.2 (L) 8.9 - 10.3 mg/dL   Total Protein 5.3 (L) 6.5 - 8.1 g/dL   Albumin 2.6 (L) 3.5 - 5.0 g/dL   AST 42 (H) 15 - 41 U/L   ALT 18 17 - 63 U/L   Alkaline Phosphatase 54 38 - 126 U/L   Total Bilirubin 0.5 0.3 -  1.2 mg/dL   GFR calc non Af Amer 31 (L) >60 mL/min   GFR calc Af Amer 36 (L) >60 mL/min   Anion gap 10 5 - 15  CBC with Differential     Status: Abnormal   Collection Time: 02/05/18 11:40 AM  Result Value Ref Range   WBC 24.9 (H) 4.0 - 10.5 K/uL   RBC 2.12 (L) 4.22 - 5.81 MIL/uL   Hemoglobin 6.7 (LL) 13.0 - 17.0 g/dL   HCT 22.1 (L) 39.0 - 52.0 %   MCV 104.2 (H) 78.0 - 100.0 fL   MCH 31.6 26.0 - 34.0 pg   MCHC 30.3 30.0 - 36.0 g/dL   RDW 14.6 11.5 - 15.5 %   Platelets 351 150 - 400 K/uL   Neutrophils Relative % 79 %   Lymphocytes Relative 15 %   Monocytes Relative 5 %   Eosinophils Relative 1 %   Basophils Relative 0 %   Neutro Abs 19.7 (H) 1.7 - 7.7 K/uL   Lymphs Abs 3.7 0.7 - 4.0 K/uL   Monocytes Absolute 1.2 (H) 0.1 - 1.0 K/uL   Eosinophils Absolute 0.3 0.0 - 0.7 K/uL   Basophils  Absolute 0.0 0.0 - 0.1 K/uL   RBC Morphology POLYCHROMASIA PRESENT    WBC Morphology TOXIC GRANULATION   POC occult blood, ED     Status: Abnormal   Collection Time: 02/05/18 11:48 AM  Result Value Ref Range   Fecal Occult Bld POSITIVE (A) NEGATIVE  Type and screen [facility] [place]     Status: None (Preliminary result)   Collection Time: 02/05/18 11:50 AM  Result Value Ref Range   ABO/RH(D) O POS    Antibody Screen NEG    Sample Expiration 02/08/2018    Unit Number [id number]    Blood Component Type RED CELLS,LR    Unit division 00    Status of Unit ISSUED    Transfusion Status OK TO TRANSFUSE    Crossmatch Result      Compatible Performed at [facility], [address], [place], [place] [address]    Unit Number [id number]    Blood Component Type RED CELLS,LR    Unit division 00    Status of Unit ALLOCATED    Transfusion Status OK TO TRANSFUSE    Crossmatch Result Compatible   ABO/Rh     Status: None   Collection Time: 02/05/18 11:50 AM  Result Value Ref Range   ABO/RH(D)      O POS Performed at [facility] [address], [place],  [address]   I-stat Chem 8, ED     Status: Abnormal   Collection Time: 02/05/18 12:17 PM  Result Value Ref Range   Sodium 148 (H) 135 - 145 mmol/L   Potassium 4.3 3.5 - 5.1 mmol/L   Chloride 117 (H) 101 - 111 mmol/L   BUN 70 (H) 6 - 20 mg/dL   Creatinine, Ser 1.70 (H) 0.61 - 1.24 mg/dL   Glucose, Bld 161 (H) 65 - 99 mg/dL   Calcium, Ion 1.28 1.15 - 1.40 mmol/L   TCO2 18 (L) 22 - 32 mmol/L   Hemoglobin 6.5 (LL) 13.0 - 17.0 g/dL   HCT 19.0 (L) 39.0 - 52.0 %   Comment NOTIFIED PHYSICIAN   Prepare RBC     Status: None   Collection Time: 02/05/18 12:25 PM  Result Value Ref Range   Order Confirmation      ORDER PROCESSED BY BLOOD BANK Performed  at [facility], [place] [address], [place], [place] [address]      No results found.  ROS:  As stated above in the HPI otherwise negative.  Blood  pressure 122/62, pulse 86, temperature 98 F (36.7 C), temperature source Oral, resp. rate (!) 23, height 5\' 2"  (1.575 m), weight 55.8 kg (123 lb), SpO2 98 %.    PE: Gen: NAD, Alert and Oriented HEENT:  [place]/AT, EOMI Neck: Supple, no LAD Lungs: CTA Bilaterally CV: RRR without M/G/R ABM: Soft, NTND, +BS Ext: No C/C/E  Assessment/Plan: 1) Probable UG Bleed. 2) Anemia. 3) CAD. 4) S/p left hip arthroplasty.   The patient appears to have an UGI bleed.  His baseline HGB ranges from 10-13 g/dL and his admission value was at 6.5 g/dL.  He is hemodynamically stable.  After exiting his room a family member reported that he has had issues with GERD for the past 50 years and he takes Rolaids on a semi-routine basis.  Plan: 1) EGD tomorrow with Dr. Fuller Plan. 2) Agree with blood transfusions. 3) Follow HGB.  Zosia Lucchese D 02/05/2018, 4:50 PM

## 2018-02-05 NOTE — ED Notes (Signed)
CRITICAL VALUE ALERT  Critical Value:  [place] 6.5  Date & Time Notied:  02/05/18 1219  Provider Notified: Langston Masker, PA

## 2018-02-05 NOTE — Plan of Care (Signed)
  Problem: Pain Managment: Goal: General experience of comfort will improve Outcome: Progressing   

## 2018-02-05 NOTE — ED Triage Notes (Signed)
Per nursing home patient had large bowel movement today with dark stools. Per patient there is no pain at this time.

## 2018-02-06 ENCOUNTER — Observation Stay (HOSPITAL_COMMUNITY): Payer: Medicare Other | Admitting: Certified Registered Nurse Anesthetist

## 2018-02-06 ENCOUNTER — Encounter (HOSPITAL_COMMUNITY)
Admission: EM | Disposition: A | Discharge status: Skilled Nursing Facility | Payer: Self-pay | Source: Home / Self Care | Attending: Emergency Medicine

## 2018-02-06 ENCOUNTER — Encounter (HOSPITAL_COMMUNITY): Payer: Self-pay | Admitting: Gastroenterology

## 2018-02-06 DIAGNOSIS — D72829 Elevated white blood cell count, unspecified: Secondary | ICD-10-CM | POA: Diagnosis not present

## 2018-02-06 DIAGNOSIS — K315 Obstruction of duodenum: Secondary | ICD-10-CM | POA: Diagnosis not present

## 2018-02-06 DIAGNOSIS — K269 Duodenal ulcer, unspecified as acute or chronic, without hemorrhage or perforation: Secondary | ICD-10-CM | POA: Diagnosis not present

## 2018-02-06 DIAGNOSIS — K921 Melena: Secondary | ICD-10-CM | POA: Diagnosis not present

## 2018-02-06 DIAGNOSIS — K2100 Gastro-esophageal reflux disease with esophagitis, without bleeding: Secondary | ICD-10-CM

## 2018-02-06 DIAGNOSIS — K319 Disease of stomach and duodenum, unspecified: Secondary | ICD-10-CM | POA: Diagnosis not present

## 2018-02-06 DIAGNOSIS — E870 Hyperosmolality and hypernatremia: Secondary | ICD-10-CM | POA: Diagnosis not present

## 2018-02-06 DIAGNOSIS — F0390 Unspecified dementia without behavioral disturbance: Secondary | ICD-10-CM

## 2018-02-06 DIAGNOSIS — K449 Diaphragmatic hernia without obstruction or gangrene: Secondary | ICD-10-CM

## 2018-02-06 DIAGNOSIS — D62 Acute posthemorrhagic anemia: Secondary | ICD-10-CM | POA: Diagnosis not present

## 2018-02-06 DIAGNOSIS — K3189 Other diseases of stomach and duodenum: Secondary | ICD-10-CM | POA: Diagnosis not present

## 2018-02-06 DIAGNOSIS — N184 Chronic kidney disease, stage 4 (severe): Secondary | ICD-10-CM | POA: Diagnosis not present

## 2018-02-06 DIAGNOSIS — N179 Acute kidney failure, unspecified: Secondary | ICD-10-CM | POA: Diagnosis not present

## 2018-02-06 DIAGNOSIS — K2270 Barrett's esophagus without dysplasia: Secondary | ICD-10-CM | POA: Diagnosis not present

## 2018-02-06 HISTORY — PX: BIOPSY: SHX5522

## 2018-02-06 HISTORY — PX: ESOPHAGOGASTRODUODENOSCOPY: SHX5428

## 2018-02-06 LAB — BPAM RBC
Blood Product Expiration Date: 201907142359
Blood Product Expiration Date: 201907142359
ISSUE DATE / TIME: 201906162033
ISSUE DATE / TIME: 201906161330
UNIT TYPE AND RH: 5100
Unit Type and Rh: 5100

## 2018-02-06 LAB — CBC
HCT: 30.8 % — ABNORMAL LOW (ref 39.0–52.0)
HEMOGLOBIN: 10.1 g/dL — AB (ref 13.0–17.0)
MCH: 30.1 pg (ref 26.0–34.0)
MCHC: 32.8 g/dL (ref 30.0–36.0)
MCV: 91.7 fL (ref 78.0–100.0)
Platelets: 294 10*3/uL (ref 150–400)
RBC: 3.36 MIL/uL — ABNORMAL LOW (ref 4.22–5.81)
RDW: 17.1 % — AB (ref 11.5–15.5)
WBC: 22.7 10*3/uL — ABNORMAL HIGH (ref 4.0–10.5)

## 2018-02-06 LAB — TYPE AND SCREEN
ABO/RH(D): O POS
Antibody Screen: NEGATIVE
Unit division: 0
Unit division: 0

## 2018-02-06 SURGERY — EGD (ESOPHAGOGASTRODUODENOSCOPY)
Anesthesia: Monitor Anesthesia Care

## 2018-02-06 MED ORDER — PANTOPRAZOLE SODIUM 40 MG PO TBEC
40.0000 mg | DELAYED_RELEASE_TABLET | Freq: Two times a day (BID) | ORAL | Status: DC
  Administered 2018-02-06 – 2018-02-07 (×3): 40 mg via ORAL
  Filled 2018-02-06 (×2): qty 1

## 2018-02-06 MED ORDER — ONDANSETRON HCL 4 MG/2ML IJ SOLN
INTRAMUSCULAR | Status: DC | PRN
  Administered 2018-02-06: 4 mg via INTRAVENOUS

## 2018-02-06 MED ORDER — PROPOFOL 500 MG/50ML IV EMUL
INTRAVENOUS | Status: DC | PRN
  Administered 2018-02-06: 75 ug/kg/min via INTRAVENOUS

## 2018-02-06 MED ORDER — LACTATED RINGERS IV SOLN
INTRAVENOUS | Status: DC | PRN
  Administered 2018-02-06: 14:00:00 via INTRAVENOUS

## 2018-02-06 MED ORDER — PROPOFOL 10 MG/ML IV BOLUS
INTRAVENOUS | Status: DC | PRN
  Administered 2018-02-06 (×4): 20 mg via INTRAVENOUS

## 2018-02-06 NOTE — Clinical Social Work Note (Signed)
Clinical Social Work Assessment  Patient Details  Name: Daniel Schmitt MRN: [id number] Date of Birth: 1925/12/01  Date of referral:  02/06/18               Reason for consult:  Discharge Planning                Permission sought to share information with:  Facility Sport and exercise psychologist, Family Supports Permission granted to share information::  Yes, Verbal Permission Granted  Name::     Electrical engineer::  [place]  Relationship::  Daughter  Contact Information:  [phone]  Housing/Transportation Living arrangements for the past 2 months:  [place], [place] of Information:  Adult Children Patient Interpreter Needed:  None Criminal Activity/Legal Involvement Pertinent to Current Situation/Hospitalization:  No - Comment as needed Significant Relationships:  Adult Children, Spouse Lives with:  Adult Children Do you feel safe going back to the place where you live?  Yes Need for family participation in patient care:  Yes (Comment)  Care giving concerns:  CSW received consult regarding discharge planning. CSW spoke with patient's daughter, Daniel Schmitt. She reports that patient has been at [facility] for rehab the past week and will return there at discharge. CSW to continue to follow and assist with discharge planning needs.   Social Worker assessment / plan:  CSW spoke with patient's daughter concerning possibility of returning to rehab at [facility] before returning home.  Employment status:  Retired Forensic scientist:  Medicare PT Recommendations:  [place] / Referral to community resources:  [place]  Patient/Family's Response to care:  Patient's daughter expressed agreement with discharge back to [place] when stable. She reports that her mother has been diagnosed with cancer so she is dealing with a lot.   Patient/Family's Understanding of and Emotional Response to Diagnosis, Current Treatment, and Prognosis:   Patient/family is realistic regarding therapy needs and expressed being hopeful for return to SNF placement. Patient's daughter expressed understanding of CSW role and discharge process as well as medical condition. No questions/concerns about plan or treatment.    Emotional Assessment Appearance:  Appears stated age Attitude/Demeanor/Rapport:  Unable to Assess Affect (typically observed):  Unable to Assess Orientation:  Oriented to Self, Oriented to Place, Oriented to  Time, Oriented to Situation(Memory impairment) Alcohol / Substance use:  Not Applicable Psych involvement (Current and /or in the community):  No (Comment)  Discharge Needs  Concerns to be addressed:  Care Coordination Readmission within the last 30 days:  Yes Current discharge risk:  Dependent with Mobility Barriers to Discharge:  Continued Medical Work up   [employer], Daniel Schmitt 02/06/2018, 11:07 AM

## 2018-02-06 NOTE — Anesthesia Procedure Notes (Addendum)
Procedure Name: MAC Date/Time: 02/06/2018 1:38 PM Performed by: Leonor Liv, CRNA Pre-anesthesia Checklist: Patient identified, Emergency Drugs available, Suction available, Patient being monitored and Timeout performed Oxygen Delivery Method: Nasal cannula Placement Confirmation: positive ETCO2

## 2018-02-06 NOTE — Interval H&P Note (Signed)
History and Physical Interval Note:  02/06/2018 1:34 PM  Daniel Schmitt  has presented today for surgery, with the diagnosis of UGI bleed  The various methods of treatment have been discussed with the patient and family. After consideration of risks, benefits and other options for treatment, the patient has consented to  Procedure(s): ESOPHAGOGASTRODUODENOSCOPY (EGD) (N/A) as a surgical intervention .  The patient's history has been reviewed, patient examined, no change in status, stable for surgery.  I have reviewed the patient's chart and labs.  Questions were answered to the patient's satisfaction.     Pricilla Riffle. [place] Plan

## 2018-02-06 NOTE — Anesthesia Preprocedure Evaluation (Signed)
Anesthesia Evaluation  Patient identified by MRN, date of birth, ID band Patient awake    Reviewed: Allergy & Precautions, NPO status , Patient's Chart, lab work & pertinent test results  Airway Mallampati: II  TM Distance: >3 FB Neck ROM: Full    Dental  (+) Dental Advisory Given   Pulmonary former smoker,    breath sounds clear to auscultation       Cardiovascular + Past MI and + Cardiac Stents   Rhythm:Regular Rate:Normal  '17 TTE - EF 55-65%. Grade 1 diastolic dysfunction, trivial MR.  '17 Cath - Mid RCA lesion, 95% stenosed. Post intervention with a 3.0 x 38 Synergy DES postdilated to 3.5 mm, there is a 0% residual stenosis. Mildly increased LVEDP. Continuing aggressive hydration to prevent contrast-induced nephropathy.   Neuro/Psych Dementia Vertigo    GI/Hepatic negative GI ROS, Neg liver ROS,   Endo/Other  negative endocrine ROS  Renal/GU Renal InsufficiencyRenal disease  negative genitourinary   Musculoskeletal negative musculoskeletal ROS (+)   Abdominal   Peds  Hematology  (+) anemia ,   Anesthesia Other Findings Basal cell carcinoma  Reproductive/Obstetrics                             Anesthesia Physical  Anesthesia Plan  ASA: III  Anesthesia Plan: MAC   Post-op Pain Management:    Induction: Intravenous  PONV Risk Score and Plan: 3 and Treatment may vary due to age or medical condition, Ondansetron and Propofol infusion  Airway Management Planned: Nasal Cannula  Additional Equipment: None  Intra-op Plan:   Post-operative Plan:   Informed Consent: I have reviewed the patients History and Physical, chart, labs and discussed the procedure including the risks, benefits and alternatives for the proposed anesthesia with the patient or authorized representative who has indicated his/her understanding and acceptance.   Dental advisory given  Plan Discussed with:  CRNA and Anesthesiologist  Anesthesia Plan Comments:         Anesthesia Quick Evaluation

## 2018-02-06 NOTE — Progress Notes (Signed)
Progress Note    Daniel Schmitt  NFA:[id number] DOB: 11-Apr-1926  DOA: 02/05/2018 PCP: [name], MD    Brief Narrative:    Medical records reviewed and are as summarized below:  Daniel Schmitt is an 84 y.o. male with medical history significant of recent hip fracture with left total hip replacement earlier this month, history of myocardial infarction, coronary artery disease, basal cell carcinoma, and mild dementia who is currently at [facility] rehab due to rehabilitation for his hip fracture.  He was discharged from our facility on 01/28/18 on Plavix and Lovenox.  A he had a large black-colored bowel movement noted by nursing staff.     Assessment/Plan:   Principal Problem:   GI bleed Active Problems:   CKD (chronic kidney disease)   Dementia   Closed displaced fracture of left femoral neck (HCC)   Acute blood loss anemia   Melena   Gastroesophageal reflux disease with esophagitis  GI bleeding due to esophagitis/duodenal ulcers -s/p EGD -hold plavix for 7 days (drug eluding stent in 2017-- will need cardiology follow up to determine if able to stop-- follows with Dr. Johnsie Cancel) -pathology pending -clear diet today   Acute blood loss anemia:  -s/p 2 units of packed red blood cells -monitor  Chronic kidney disease stage IV  - Avoid nephrotoxic agents.  Supportive care.  Dementia: Continue supportive care  Closed displaced fracture of left femoral neck with recent repair.  Continue rehab once discharged from the hospital. -will not need lovenox going forward-- working with PT  Leukocytosis -no fevers -monitor -unclear etiology-- ? Stress -check u/a    Family Communication/Anticipated D/C date and plan/Code Status   DVT prophylaxis: scd Code Status: Full Code.  Family Communication: none at bedside Disposition Plan: SNF in AM?   Medical Consultants:    GI    Subjective:   No current complaints-- no overnight events  Objective:    Vitals:     02/06/18 0436 02/06/18 1053 02/06/18 1124 02/06/18 1306  BP: 131/66 138/79 138/79 (!) 142/61  Pulse: 94  87 92  Resp: 17  16 (!) 21  Temp: 98.9 F (37.2 C)  98.1 F (36.7 C) 97.9 F (36.6 C)  TempSrc: Oral   Oral  SpO2: 95%  96% 97%  Weight:    55.8 kg (123 lb)  Height:    5\' 2"  (1.575 m)    Intake/Output Summary (Last 24 hours) at 02/06/2018 1420 Last data filed at 02/06/2018 1200 Gross per 24 hour  Intake 1050 ml  Output 0 ml  Net 1050 ml   Filed Weights   02/05/18 1126 02/06/18 1306  Weight: 55.8 kg (123 lb) 55.8 kg (123 lb)    Exam: In bed-- will only say a few words rrr No wheezing, no increased work of breathing No LE edema  Data Reviewed:   I have personally reviewed following labs and imaging studies:  Labs: Labs show the following:   Basic Metabolic Panel: Recent Labs  Lab 02/05/18 1140 02/05/18 1217  NA 146* 148*  K 4.2 4.3  CL 117* 117*  CO2 19*  --   GLUCOSE 168* 161*  BUN 72* 70*  CREATININE 1.82* 1.70*  CALCIUM 8.2*  --    GFR Estimated Creatinine Clearance: 22.8 mL/min (A) (by C-G formula based on SCr of 1.7 mg/dL (H)). Liver Function Tests: Recent Labs  Lab 02/05/18 1140  AST 42*  ALT 18  ALKPHOS 54  BILITOT 0.5  PROT 5.3*  ALBUMIN  2.6*   No results for input(s): LIPASE, AMYLASE in the last 168 hours. No results for input(s): AMMONIA in the last 168 hours. Coagulation profile No results for input(s): INR, PROTIME in the last 168 hours.  CBC: Recent Labs  Lab 02/05/18 1140 02/05/18 1217 02/06/18 0421  WBC 24.9*  --  22.7*  NEUTROABS 19.7*  --   --   HGB 6.7* 6.5* 10.1*  HCT 22.1* 19.0* 30.8*  MCV 104.2*  --  91.7  PLT 351  --  294   Cardiac Enzymes: No results for input(s): CKTOTAL, CKMB, CKMBINDEX, TROPONINI in the last 168 hours. BNP (last 3 results) No results for input(s): PROBNP in the last 8760 hours. CBG: No results for input(s): GLUCAP in the last 168 hours. D-Dimer: No results for input(s): DDIMER in  the last 72 hours. Hgb A1c: No results for input(s): HGBA1C in the last 72 hours. Lipid Profile: No results for input(s): CHOL, HDL, LDLCALC, TRIG, CHOLHDL, LDLDIRECT in the last 72 hours. Thyroid function studies: No results for input(s): TSH, T4TOTAL, T3FREE, THYROIDAB in the last 72 hours.  Invalid input(s): FREET3 Anemia work up: No results for input(s): VITAMINB12, FOLATE, FERRITIN, TIBC, IRON, RETICCTPCT in the last 72 hours. Sepsis Labs: Recent Labs  Lab 02/05/18 1140 02/06/18 0421  WBC 24.9* 22.7*    Microbiology No results found for this or any previous visit (from the past 240 hour(s)).  Procedures and diagnostic studies:  No results found.  Medications:   . [MAR Hold] amLODipine  10 mg Oral Daily  . [MAR Hold] atorvastatin  80 mg Oral q1800  . [MAR Hold] carvedilol  12.5 mg Oral BID WC  . [MAR Hold] donepezil  5 mg Oral QHS  . [MAR Hold] levothyroxine  25 mcg Oral QAC breakfast  . [MAR Hold] lidocaine  1 patch Transdermal Daily  . [MAR Hold] mirtazapine  7.5 mg Oral QHS  . pantoprazole  40 mg Oral BID  . [MAR Hold] sodium chloride flush  3 mL Intravenous Q12H   Continuous Infusions:   LOS: 0 days   Geradine Girt  [facility]   *Please refer to [email or web address], password [email or web address] to get updated schedule on who will round on this patient, as hospitalists switch teams weekly. If 7PM-7AM, please contact night-coverage at [email or web address], password [email or web address] for any overnight needs.  02/06/2018, 2:20 PM

## 2018-02-06 NOTE — Evaluation (Signed)
Physical Therapy Evaluation Patient Details Name: Daniel Schmitt MRN: [id number] DOB: April 07, 1926 Today's Date: 02/06/2018   History of Present Illness  84 y.o. male was readmitted after THA L hip from fall and L fem neck fracture, now note GI bleed.  Transfused 2 units RBC. PMHx:   dementia, NSTEMI status post stent placement, basal cell carcinoma,  left direct anterior hip hemiarthroplasty 01/26/18, MI,   Clinical Impression  Pt is getting up with RW with PT but did note that he is unsafe and requires significant repetition of his instructions for PT and gait.  He is prepared to return to SNF when done with medical care, and is still appropriate for this setting.  Will follow acutely for strengthening and safety/balance education during mobility.  Noted his O2 sats and pulses were WFL with all mobility.    Follow Up Recommendations SNF    Equipment Recommendations  Rolling walker with 5" wheels    Recommendations for Other Services       Precautions / Restrictions Precautions Precautions: Fall(L hip THA with direct anterior approach) Restrictions Weight Bearing Restrictions: No LLE Weight Bearing: Weight bearing as tolerated      Mobility  Bed Mobility Overal bed mobility: Needs Assistance Bed Mobility: Supine to Sit;Sit to Supine     Supine to sit: Min assist;HOB elevated Sit to supine: Min guard;Min assist;HOB elevated      Transfers Overall transfer level: Needs assistance Equipment used: Rolling walker (2 wheeled) Transfers: Sit to/from Stand Sit to Stand: Min guard         General transfer comment: pt insists on putting hands on walker to stand despite PT asking him repeatedly to use bed to push off  Ambulation/Gait Ambulation/Gait assistance: Min guard Gait Distance (Feet): 38 Feet Assistive device: Rolling walker (2 wheeled) Gait Pattern/deviations: Step-to pattern;Step-through pattern;Decreased stride length;Narrow base of support;Trunk flexed Gait  velocity: reduced Gait velocity interpretation: <1.8 ft/sec, indicate of risk for recurrent falls General Gait Details: cued sequence mainly but pt is able to turn and maneuver walker as cued  Stairs            Wheelchair Mobility    Modified Rankin (Stroke Patients Only)       Balance Overall balance assessment: History of Falls;Needs assistance Sitting-balance support: Feet supported Sitting balance-Leahy Scale: Good     Standing balance support: Bilateral upper extremity supported Standing balance-Leahy Scale: Fair                               Pertinent Vitals/Pain Pain Assessment: No/denies pain    Home Living Family/patient expects to be discharged to:: Skilled nursing facility Living Arrangements: (not sure of previous situation due to pt's memory)             Home Equipment: None      Prior Function Level of Independence: Independent         Comments: poor historian but did previously live in a staired environment      Hand Dominance        Extremity/Trunk Assessment   Upper Extremity Assessment Upper Extremity Assessment: Overall WFL for tasks assessed    Lower Extremity Assessment Lower Extremity Assessment: LLE deficits/detail LLE Deficits / Details: 4- to 4+ LLE strength LLE Coordination: decreased fine motor;decreased gross motor    Cervical / Trunk Assessment Cervical / Trunk Assessment: Kyphotic  Communication   Communication: No difficulties  Cognition Arousal/Alertness: Awake/alert Behavior During Therapy:  Flat affect Overall Cognitive Status: History of cognitive impairments - at baseline                                        General Comments      Exercises     Assessment/Plan    PT Assessment Patient needs continued PT services  PT Problem List Decreased strength;Decreased range of motion;Decreased activity tolerance;Decreased balance;Decreased mobility;Decreased  coordination;Decreased cognition;Decreased knowledge of use of DME;Decreased safety awareness       PT Treatment Interventions DME instruction;Gait training;Functional mobility training;Therapeutic activities;Therapeutic exercise;Balance training;Neuromuscular re-education;Patient/family education    PT Goals (Current goals can be found in the Care Plan section)  Acute Rehab PT Goals Patient Stated Goal: none stated PT Goal Formulation: Patient unable to participate in goal setting Time For Goal Achievement: 02/13/18 Potential to Achieve Goals: Good    Frequency Min 3X/week   Barriers to discharge Inaccessible home environment(has been in rehab in SNF)      Co-evaluation               AM-PAC PT "6 Clicks" Daily Activity  Outcome Measure Difficulty turning over in bed (including adjusting bedclothes, sheets and blankets)?: A Little Difficulty moving from lying on back to sitting on the side of the bed? : A Little Difficulty sitting down on and standing up from a chair with arms (e.g., wheelchair, bedside commode, etc,.)?: Unable Help needed moving to and from a bed to chair (including a wheelchair)?: A Little Help needed walking in hospital room?: A Little Help needed climbing 3-5 steps with a railing? : A Little 6 Click Score: 16    End of Session Equipment Utilized During Treatment: Gait belt Activity Tolerance: Patient tolerated treatment well;Patient limited by lethargy Patient left: in bed;with call bell/phone within reach;with bed alarm set Nurse Communication: Mobility status PT Visit Diagnosis: Unsteadiness on feet (R26.81);Muscle weakness (generalized) (M62.81);Difficulty in walking, not elsewhere classified (R26.2)    Time: 1610-9604 PT Time Calculation (min) (ACUTE ONLY): 28 min   Charges:   PT Evaluation $PT Eval Moderate Complexity: 1 Mod PT Treatments $Gait Training: 8-22 mins   PT G Codes:   PT G-Codes **NOT FOR INPATIENT CLASS** Functional  Assessment Tool Used: AM-PAC 6 Clicks Basic Mobility    Ramond Dial 02/06/2018, 11:14 AM   Mee Hives, PT MS Acute Rehab Dept. Number: [place] and [place]

## 2018-02-06 NOTE — NC FL2 (Signed)
[place] LEVEL OF CARE SCREENING TOOL     IDENTIFICATION  Patient Name: Daniel Schmitt Birthdate: 12/08/1925 Sex: male Admission Date (Current Location): 02/05/2018  [facility] and [place] Number:  Herbalist and Address:  [facility] [place], [place] [address], [place], [place] [address]      Provider Number: [id number]  Attending Physician Name and Address:  Geradine Girt, DO  Relative Name and Phone Number:  [name], daughter, [phone]    Current Level of Care: Hospital Recommended Level of Care: [place] Prior Approval Number:    Date Approved/Denied:   PASRR Number: [id number]  Discharge Plan: SNF    Current Diagnoses: Patient Active Problem List   Diagnosis Date Noted  . GI bleed 02/05/2018  . Acute blood loss anemia 02/05/2018  . Closed displaced fracture of left femoral neck ([place]) 01/26/2018  . Closed left hip fracture ([place]) 01/24/2018  . Basal cell carcinoma (BCC) of face 10/11/2017  . Dementia 07/05/2016  . CKD (chronic kidney disease) 02/26/2016  . History of GI bleed 02/26/2016  . NSTEMI (non-ST elevated myocardial infarction) ([place]) 02/17/2016    Orientation RESPIRATION BLADDER Height & Weight     Self, Time, Situation, Place  Normal Continent Weight: 55.8 kg (123 lb) Height:  5\' 2"  (157.5 cm)  BEHAVIORAL SYMPTOMS/MOOD NEUROLOGICAL BOWEL NUTRITION STATUS      Continent Diet(Please see DC Summary)  AMBULATORY STATUS COMMUNICATION OF NEEDS Skin   Extensive Assist Verbally Surgical wounds(Closed incision on hip)                       Personal Care Assistance Level of Assistance  Bathing, Feeding, Dressing Bathing Assistance: Limited assistance Feeding assistance: Independent Dressing Assistance: Limited assistance     Functional Limitations Info  Sight, Hearing, Speech Sight Info: Adequate Hearing Info: Adequate Speech Info: Impaired    SPECIAL CARE FACTORS FREQUENCY  PT (By  licensed PT)     PT Frequency: 5x/week              Contractures Contractures Info: Not present    Additional Factors Info  Code Status, Allergies Code Status Info: Full Allergies Info: Aspirin           Current Medications (02/06/2018):  This is the current hospital active medication list Current Facility-Administered Medications  Medication Dose Route Frequency Provider Last Rate Last Dose  . acetaminophen (TYLENOL) tablet 1,000 mg  1,000 mg Oral Q8H PRN Lady Deutscher, MD      . amLODipine (NORVASC) tablet 10 mg  10 mg Oral Daily Lady Deutscher, MD   10 mg at 02/06/18 1053  . atorvastatin (LIPITOR) tablet 80 mg  80 mg Oral q1800 Lady Deutscher, MD   80 mg at 02/05/18 1837  . carvedilol (COREG) tablet 12.5 mg  12.5 mg Oral BID WC Lady Deutscher, MD   12.5 mg at 02/05/18 1837  . donepezil (ARICEPT) tablet 5 mg  5 mg Oral QHS Lady Deutscher, MD   5 mg at 02/05/18 2156  . levothyroxine (SYNTHROID, LEVOTHROID) tablet 25 mcg  25 mcg Oral QAC breakfast Lady Deutscher, MD   25 mcg at 02/06/18 1052  . lidocaine (LIDODERM) 5 % 1 patch  1 patch Transdermal Daily Lady Deutscher, MD   1 patch at 02/06/18 1057  . mirtazapine (REMERON) tablet 7.5 mg  7.5 mg Oral QHS Lady Deutscher, MD   7.5 mg at 02/05/18  2155  . nitroGLYCERIN (NITROSTAT) SL tablet 0.4 mg  0.4 mg Sublingual Q5 Min x 3 PRN Lady Deutscher, MD      . ondansetron [facility]) tablet 4 mg  4 mg Oral Q6H PRN Lady Deutscher, MD       Or  . ondansetron [facility]) injection 4 mg  4 mg Intravenous Q6H PRN Lady Deutscher, MD      . oxyCODONE (Oxy IR/ROXICODONE) immediate release tablet 5 mg  5 mg Oral Q4H PRN Lady Deutscher, MD   5 mg at 02/05/18 1837  . sodium chloride flush (NS) 0.9 % injection 3 mL  3 mL Intravenous Q12H Lady Deutscher, MD   5 mL at 02/06/18 1104  . traMADol (ULTRAM) tablet 25 mg  25 mg Oral Q12H PRN Lady Deutscher, MD         Discharge Medications: Please see  discharge summary for a list of discharge medications.  Relevant Imaging Results:  Relevant Lab Results:   Additional Information ZOX:[id number]  Benard Halsted, LCSWA

## 2018-02-06 NOTE — Op Note (Signed)
[facility] Patient Name: Daniel Schmitt Procedure Date : 02/06/2018 MRN: [id number] Attending MD: Ladene Artist , MD Date of Birth: [birth year] CSN: [id number] Age: 84 Admit Type: Inpatient Procedure:                Upper GI endoscopy Indications:              Melena Providers:                Pricilla Riffle. Fuller Plan, MD, Cleda Daub, RN, Cherylynn Ridges, Technician, Trixie Deis, CRNA Referring MD:             [facility] Medicines:                Monitored Anesthesia Care Complications:            No immediate complications. Estimated Blood Loss:     Estimated blood loss was minimal. Procedure:                Pre-Anesthesia Assessment:                           - Prior to the procedure, a History and Physical                            was performed, and patient medications and                            allergies were reviewed. The patient's tolerance of                            previous anesthesia was also reviewed. The risks                            and benefits of the procedure and the sedation                            options and risks were discussed with the patient.                            All questions were answered, and informed consent                            was obtained. Prior Anticoagulants: The patient has                            taken Plavix (clopidogrel), last dose was 2 days                            prior to procedure. ASA Grade Assessment: III - A                            patient with severe systemic disease. After  reviewing the risks and benefits, the patient was                            deemed in satisfactory condition to undergo the                            procedure.                           After obtaining informed consent, the endoscope was                            passed under direct vision. Throughout the                            procedure, the patient's blood  pressure, pulse, and                            oxygen saturations were monitored continuously. The                            EG-[id number] ([id number]) scope was introduced through the                            mouth, and advanced to the second part of duodenum.                            The upper GI endoscopy was accomplished without                            difficulty. The patient tolerated the procedure                            well. Scope In: Scope Out: Findings:      LA Grade D (one or more mucosal breaks involving at least 75% of       esophageal circumference) esophagitis with no bleeding was found in the       distal and mid esophagus. Biopsies were taken with a cold forceps for       histology.      The proximal esophagus was normal.      A medium-sized hiatal hernia was present.      A single localized, 8 mm non-bleeding linear erosion was found in the       cardia. There were no stigmata of recent bleeding.      The exam of the stomach was otherwise normal. Biopsies obtained for H       pylori.      Five non-bleeding cratered duodenal ulcers, one with pigmented material,       were found in the duodenal bulb. The largest ulcer was 10 mm in largest       dimension and it had the pigmented spot.      An acquired benign-appearing, intrinsic moderate stenosis was found in       the second portion of the duodenum and was traversed.      The exam of the duodenum was otherwise normal. Impression:               -  LA Grade D reflux esophagitis. Biopsied.                           - Normal proximal esophagus.                           - Medium-sized hiatal hernia.                           - Non-bleeding linear erosive gastropathy                            associated with the hiatal hernia.                           - Multiple non-bleeding duodenal ulcers with                            pigmented material on the largest DU.                           - Acquired duodenal  stenosis. Recommendation:           - Return patient to hospital ward for ongoing care.                           - Clear liquid diet today.                           - Antireflux measures long term.                           - Continue present medications.                           - No aspirin, ibuprofen, naproxen, or other                            non-steroidal anti-inflammatory drugs.                           - Continue to hold Plavix and Lovenox for at least                            7 days. Primary service to re-evaluate the long                            term need for Plavix and Lovenox.                           - Await pathology results. Procedure Code(s):        --- Professional ---                           [phone], Esophagogastroduodenoscopy, flexible,  transoral; with biopsy, single or multiple Diagnosis Code(s):        --- Professional ---                           K21.0, Gastro-esophageal reflux disease with                            esophagitis                           K44.9, Diaphragmatic hernia without obstruction or                            gangrene                           K31.89, Other diseases of stomach and duodenum                           K26.9, Duodenal ulcer, unspecified as acute or                            chronic, without hemorrhage or perforation                           K31.5, Obstruction of duodenum                           K92.1, Melena (includes Hematochezia) CPT copyright 2017 [employer]. All rights reserved. The codes documented in this report are preliminary and upon coder review may  be revised to meet current compliance requirements. Ladene Artist, MD 02/06/2018 2:13:11 PM This report has been signed electronically. Number of Addenda: 0

## 2018-02-07 DIAGNOSIS — Z96642 Presence of left artificial hip joint: Secondary | ICD-10-CM | POA: Diagnosis not present

## 2018-02-07 DIAGNOSIS — D649 Anemia, unspecified: Secondary | ICD-10-CM | POA: Diagnosis not present

## 2018-02-07 DIAGNOSIS — K210 Gastro-esophageal reflux disease with esophagitis: Secondary | ICD-10-CM | POA: Diagnosis not present

## 2018-02-07 DIAGNOSIS — I214 Non-ST elevation (NSTEMI) myocardial infarction: Secondary | ICD-10-CM | POA: Diagnosis not present

## 2018-02-07 DIAGNOSIS — Z7401 Bed confinement status: Secondary | ICD-10-CM | POA: Diagnosis not present

## 2018-02-07 DIAGNOSIS — K921 Melena: Secondary | ICD-10-CM

## 2018-02-07 DIAGNOSIS — K922 Gastrointestinal hemorrhage, unspecified: Secondary | ICD-10-CM | POA: Diagnosis not present

## 2018-02-07 DIAGNOSIS — N184 Chronic kidney disease, stage 4 (severe): Secondary | ICD-10-CM | POA: Diagnosis not present

## 2018-02-07 DIAGNOSIS — K269 Duodenal ulcer, unspecified as acute or chronic, without hemorrhage or perforation: Secondary | ICD-10-CM | POA: Diagnosis not present

## 2018-02-07 DIAGNOSIS — M25552 Pain in left hip: Secondary | ICD-10-CM | POA: Diagnosis not present

## 2018-02-07 DIAGNOSIS — I959 Hypotension, unspecified: Secondary | ICD-10-CM | POA: Diagnosis not present

## 2018-02-07 DIAGNOSIS — K3189 Other diseases of stomach and duodenum: Secondary | ICD-10-CM | POA: Diagnosis not present

## 2018-02-07 DIAGNOSIS — S72002D Fracture of unspecified part of neck of left femur, subsequent encounter for closed fracture with routine healing: Secondary | ICD-10-CM | POA: Diagnosis not present

## 2018-02-07 DIAGNOSIS — Z471 Aftercare following joint replacement surgery: Secondary | ICD-10-CM | POA: Diagnosis not present

## 2018-02-07 DIAGNOSIS — E870 Hyperosmolality and hypernatremia: Secondary | ICD-10-CM | POA: Diagnosis not present

## 2018-02-07 DIAGNOSIS — R41841 Cognitive communication deficit: Secondary | ICD-10-CM | POA: Diagnosis not present

## 2018-02-07 DIAGNOSIS — M2550 Pain in unspecified joint: Secondary | ICD-10-CM | POA: Diagnosis not present

## 2018-02-07 DIAGNOSIS — N189 Chronic kidney disease, unspecified: Secondary | ICD-10-CM | POA: Diagnosis not present

## 2018-02-07 DIAGNOSIS — D62 Acute posthemorrhagic anemia: Secondary | ICD-10-CM | POA: Diagnosis not present

## 2018-02-07 DIAGNOSIS — R630 Anorexia: Secondary | ICD-10-CM | POA: Diagnosis not present

## 2018-02-07 DIAGNOSIS — M6281 Muscle weakness (generalized): Secondary | ICD-10-CM | POA: Diagnosis not present

## 2018-02-07 DIAGNOSIS — R509 Fever, unspecified: Secondary | ICD-10-CM | POA: Diagnosis not present

## 2018-02-07 DIAGNOSIS — Z9181 History of falling: Secondary | ICD-10-CM | POA: Diagnosis not present

## 2018-02-07 DIAGNOSIS — R2689 Other abnormalities of gait and mobility: Secondary | ICD-10-CM | POA: Diagnosis not present

## 2018-02-07 DIAGNOSIS — F0390 Unspecified dementia without behavioral disturbance: Secondary | ICD-10-CM | POA: Diagnosis not present

## 2018-02-07 LAB — CBC
HEMATOCRIT: 26.6 % — AB (ref 39.0–52.0)
Hemoglobin: 8.5 g/dL — ABNORMAL LOW (ref 13.0–17.0)
MCH: 30.7 pg (ref 26.0–34.0)
MCHC: 32 g/dL (ref 30.0–36.0)
MCV: 96 fL (ref 78.0–100.0)
Platelets: 277 10*3/uL (ref 150–400)
RBC: 2.77 MIL/uL — ABNORMAL LOW (ref 4.22–5.81)
RDW: 17.2 % — AB (ref 11.5–15.5)
WBC: 19.2 10*3/uL — ABNORMAL HIGH (ref 4.0–10.5)

## 2018-02-07 LAB — BASIC METABOLIC PANEL
Anion gap: 6 (ref 5–15)
BUN: 61 mg/dL — AB (ref 6–20)
CALCIUM: 8.2 mg/dL — AB (ref 8.9–10.3)
CO2: 21 mmol/L — AB (ref 22–32)
CREATININE: 1.66 mg/dL — AB (ref 0.61–1.24)
Chloride: 120 mmol/L — ABNORMAL HIGH (ref 101–111)
GFR calc non Af Amer: 35 mL/min — ABNORMAL LOW (ref >60)
GFR, EST AFRICAN AMERICAN: 41 mL/min — AB (ref >60)
GLUCOSE: 111 mg/dL — AB (ref 65–99)
Potassium: 4 mmol/L (ref 3.5–5.1)
Sodium: 147 mmol/L — ABNORMAL HIGH (ref 135–145)

## 2018-02-07 MED ORDER — PANTOPRAZOLE SODIUM 40 MG PO TBEC: 40.0000 mg | DELAYED_RELEASE_TABLET | Freq: Two times a day (BID) | ORAL | Status: DC

## 2018-02-07 MED ORDER — SODIUM CHLORIDE 0.9 % IV SOLN: INTRAVENOUS | Status: DC

## 2018-02-07 NOTE — Progress Notes (Addendum)
Daily Rounding Note  02/07/2018, 9:35 AM  LOS: 0 days   SUBJECTIVE:   Chief complaint: He has no complaints other than dry mouth and feeling thirsty.  Denies abdominal pain, nausea. Says no bowel movements yesterday or today.  Note only produced 450 mL's measured urine output yesterday.  OBJECTIVE:         Vital signs in last 24 hours:    Temp:  [97.5 F (36.4 C)-98.1 F (36.7 C)] 97.5 F (36.4 C) (06/18 0500) Pulse Rate:  [79-96] 79 (06/18 0500) Resp:  [16-28] 16 (06/17 1527) BP: (100-150)/(49-79) 136/67 (06/18 0500) SpO2:  [95 %-97 %] 97 % (06/18 0500) Weight:  [123 lb (55.8 kg)] 123 lb (55.8 kg) (06/17 1306) Last BM Date: 02/05/18 Filed Weights   02/05/18 1126 02/06/18 1306  Weight: 123 lb (55.8 kg) 123 lb (55.8 kg)   General: Frail looking, sleeping but easily awakened aged WM.  Comfortable. Heart: RRR.  No MRG. Chest: Clear bilaterally.  No labored breathing or cough. Abdomen: Soft.  Not tender or distended. Extremities: No CCE. Neuro/Psych: to year, self.  Asked where he was and why he was here, listened appropriately to the answer.  Follows commands.  No tremors.  Intake/Output from previous day: 06/17 0701 - 06/18 0700 In: 0  Out: 450 [Urine:450]  Intake/Output this shift: No intake/output data recorded.  Lab Results: Recent Labs    02/05/18 1140 02/05/18 1217 02/06/18 0421 02/07/18 0506  WBC 24.9*  --  22.7* 19.2*  HGB 6.7* 6.5* 10.1* 8.5*  HCT 22.1* 19.0* 30.8* 26.6*  PLT 351  --  294 277   BMET Recent Labs    02/05/18 1140 02/05/18 1217 02/07/18 0506  NA 146* 148* 147*  K 4.2 4.3 4.0  CL 117* 117* 120*  CO2 19*  --  21*  GLUCOSE 168* 161* 111*  BUN 72* 70* 61*  CREATININE 1.82* 1.70* 1.66*  CALCIUM 8.2*  --  8.2*   LFT Recent Labs    02/05/18 1140  PROT 5.3*  ALBUMIN 2.6*  AST 42*  ALT 18  ALKPHOS 54  BILITOT 0.5     ASSESMENT:   *    GI bleed manifested by black  stools.  No PPI or H2 blocker PTA. 02/06/2018 EGD LA grade D reflux esophagitis.  Nonbleeding linear erosion associated with hiatal hernia.  Multiple nonbleeding duodenal ulcers.  Pigmented material seen within the largest of the ulcers.  Duodenal stenosis.  *   ABL anemia.  Baseline 11-12 before hip surgery.    Hgb 6.7 >> 6.5 >>  2 U PRBC (6/16)  >> 10.1 >> 8.5 (yesterday) Yesterdays's Hgb in keeping with expected rise after 2 UPRBCs, the Hgb of 10.1 represents unexpectedly large rise   *   Non-STEMI, drug-eluting stent placement 2017.  Chronic ASA/Plavix, PTA  *  S/p hip fracture, right hemiarthroplasty 01/26/2018.  On daily Lovenox for ? Duration?   *   AKI.  Improved.    *    Dementia.   PLAN   *   Hold Plavix and Lovenox for least a week, earliest start date suggested is 6/25. ? Is Lovenox still needed > 1 month post hip surgery, duration of therapy not indicated in notes last month.    *   Protonix 40 mg BID (or other PPI at discharge).  For 1 month, then drop to 1 x daily.    *   Await path report from  gastric biopsy.  If H Pylori +, will need abx.    *    My observation of him this morning is that he is not taking in much p.o.  Part of the problem is that he needs help drinking and eating.  Given his renal insufficiency, I started normal saline at 50 cc/h.  *    Advance to heart healthy diet  *   CBC in AM.    *   Even his current physical appearance, I wonder if he is not laying in bed most of the day which encourages reflux.  He should be up in a chair for the bulk of the day and possibly walking if he is doing that post hip surgery.   Azucena Freed  02/07/2018, 9:35 AM Phone [phone]     Attending physician's note   I have taken an interval history, reviewed the chart and examined the patient. I agree with the Advanced Practitioner's note, impression and recommendations.  Five duodenal ulcers. Await pathology. PPI bid for 1 month then PPI qam indefinitely. Advance  diet.  Grade D erosive esophagitis. Await pathology. Antireflux measures. Elevated HOB to > 30 degrees at all time. OOB to chair as appropriate.  DC Lovenox and hold Plavix for at least 1 week. Consider need for Plavix long term.  GI signing off. GI available if needed.    Lucio Edward, MD FACG [phone] office

## 2018-02-07 NOTE — Discharge Summary (Signed)
Physician Discharge Summary  Daniel Schmitt:[id number] DOB: Jun 13, 1926 DOA: 02/05/2018  PCP: [name], MD  Admit date: 02/05/2018 Discharge date: 02/07/2018  Admitted From: SNF Discharge disposition: SNF   Recommendations for Outpatient Follow-Up:   Encourage PO intake-- food and water Protonix 40 mg BID For 1 month, then drop to 1 x daily forever palliative care to follow at SNF (wife with new stage 4 cancer diagnosis-- patient not eating much) GI to follow up with pathology Hold plavix for 1 week-- resume 6/25 HOB to > 30 degrees at all time; He should be up in a chair for the bulk of the day BMP/cbc 1 week   Discharge Diagnosis:   Principal Problem:   GI bleed Active Problems:   CKD (chronic kidney disease)   Dementia   Closed displaced fracture of left femoral neck (HCC)   Acute blood loss anemia   Melena   Gastroesophageal reflux disease with esophagitis    Discharge Condition: Improved.  Diet recommendation: Low sodium, heart healthy  Wound care: None.  Code status: Full.   History of Present Illness:   Daniel Schmitt is a 84 y.o. male with medical history significant of recent hip fracture with left total hip replacement earlier this month, history of myocardial infarction, coronary artery disease, basal cell carcinoma, and mild dementia who is currently at [facility] rehab due to rehabilitation for his hip fracture.  He was discharged from our facility on 01/28/18 on Plavix and Lovenox.  A he had a large black-colored bowel movement noted by nursing staff.  He denies seeing this prior to this morning.  He did have a history of GI bleeding provoked by aspirin many years ago and has not had aspirin in many years.  Aspirin is listed on his discharge medication but he has not been receiving it.  He is having some increased shortness of breath while doing his physical rehabilitation recently but denies any chest pain or abdominal pain.  He denies syncope.   He denies headache or blurry vision.  He denies dysuria urinary frequency or urgency.  He has been receiving Plavix due to a history of coronary disease.  He has been on the Lovenox for DVT prophylaxis.     Hospital Course by Problem:   GI bleeding due to esophagitis/duodenal ulcers -s/p EGD -hold plavix for 7 days (drug eluding stent in 2017-- will need cardiology follow up to determine if able to stop-- follows with Dr. Johnsie Cancel) -pathology pending -encourage PO intake -see above  Hypernatremia -encourage PO intake  Acute blood loss anemia:  -s/p 2 units of packed red blood cells -monitor outpatient  Chronic kidney disease stage IV  - Avoid nephrotoxic agents. Supportive care.  Dementia: Continue supportive care -palliative care to follow at SNF  Closed displaced fracture of left femoral neck with recent repair. Continue rehab once discharged from the hospital. -will not need lovenox going forward  Leukocytosis -no fevers -monitor for resolution -unclear etiology-- ? Stress       Medical Consultants:   GI   Discharge Exam:   Vitals:   02/06/18 2226 02/07/18 0500  BP: (!) 114/56 136/67  Pulse: 96 79  Resp:    Temp: (!) 97.5 F (36.4 C) (!) 97.5 F (36.4 C)  SpO2: 97% 97%   Vitals:   02/06/18 1430 02/06/18 1527 02/06/18 2226 02/07/18 0500  BP: (!) 150/56 134/63 (!) 114/56 136/67  Pulse: 81 84 96 79  Resp: (!) 21 16  Temp:  97.6 F (36.4 C) (!) 97.5 F (36.4 C) (!) 97.5 F (36.4 C)  TempSrc:  Oral Oral Oral  SpO2: 96% 95% 97% 97%  Weight:      Height:        General exam: Appears calm and comfortable. NAD-- says he is a picky eater  The results of significant diagnostics from this hospitalization (including imaging, microbiology, ancillary and laboratory) are listed below for reference.     Procedures and Diagnostic Studies:   No results found.   Labs:   Basic Metabolic Panel: Recent Labs  Lab 02/05/18 1140 02/05/18 1217  02/07/18 0506  NA 146* 148* 147*  K 4.2 4.3 4.0  CL 117* 117* 120*  CO2 19*  --  21*  GLUCOSE 168* 161* 111*  BUN 72* 70* 61*  CREATININE 1.82* 1.70* 1.66*  CALCIUM 8.2*  --  8.2*   GFR Estimated Creatinine Clearance: 23.3 mL/min (A) (by C-G formula based on SCr of 1.66 mg/dL (H)). Liver Function Tests: Recent Labs  Lab 02/05/18 1140  AST 42*  ALT 18  ALKPHOS 54  BILITOT 0.5  PROT 5.3*  ALBUMIN 2.6*   No results for input(s): LIPASE, AMYLASE in the last 168 hours. No results for input(s): AMMONIA in the last 168 hours. Coagulation profile No results for input(s): INR, PROTIME in the last 168 hours.  CBC: Recent Labs  Lab 02/05/18 1140 02/05/18 1217 02/06/18 0421 02/07/18 0506  WBC 24.9*  --  22.7* 19.2*  NEUTROABS 19.7*  --   --   --   HGB 6.7* 6.5* 10.1* 8.5*  HCT 22.1* 19.0* 30.8* 26.6*  MCV 104.2*  --  91.7 96.0  PLT 351  --  294 277   Cardiac Enzymes: No results for input(s): CKTOTAL, CKMB, CKMBINDEX, TROPONINI in the last 168 hours. BNP: Invalid input(s): POCBNP CBG: No results for input(s): GLUCAP in the last 168 hours. D-Dimer No results for input(s): DDIMER in the last 72 hours. Hgb A1c No results for input(s): HGBA1C in the last 72 hours. Lipid Profile No results for input(s): CHOL, HDL, LDLCALC, TRIG, CHOLHDL, LDLDIRECT in the last 72 hours. Thyroid function studies No results for input(s): TSH, T4TOTAL, T3FREE, THYROIDAB in the last 72 hours.  Invalid input(s): FREET3 Anemia work up No results for input(s): VITAMINB12, FOLATE, FERRITIN, TIBC, IRON, RETICCTPCT in the last 72 hours. Microbiology No results found for this or any previous visit (from the past 240 hour(s)).   Discharge Instructions:   Discharge Instructions    Diet - low sodium heart healthy   Complete by:  As directed    Increase activity slowly   Complete by:  As directed      Allergies as of 02/07/2018      Reactions   Aspirin Other (See Comments)   Stomach bleeds         Medication List    STOP taking these medications   clopidogrel 75 MG tablet Commonly known as:  PLAVIX   enoxaparin 30 MG/0.3ML injection Commonly known as:  LOVENOX   HYDROcodone-acetaminophen 5-325 MG tablet Commonly known as:  NORCO/VICODIN   traMADol 50 MG tablet Commonly known as:  ULTRAM     TAKE these medications   acetaminophen 500 MG tablet Commonly known as:  TYLENOL Take 1,000 mg by mouth every 8 (eight) hours as needed for mild pain.   amLODipine 10 MG tablet Commonly known as:  NORVASC Take 1 tablet (10 mg total) by mouth daily.   atorvastatin 80  MG tablet Commonly known as:  LIPITOR TAKE 1 TABLET BY MOUTH EVERY DAY AT 6PM   carvedilol 12.5 MG tablet Commonly known as:  COREG TAKE 1 TABLET (12.5 MG TOTAL) BY MOUTH 2 (TWO) TIMES DAILY WITH A MEAL.   donepezil 5 MG tablet Commonly known as:  ARICEPT Take 5 mg by mouth at bedtime.   levothyroxine 25 MCG tablet Commonly known as:  SYNTHROID, LEVOTHROID Take 25 mcg by mouth daily before breakfast.   Lidocaine 4 % Ptch Apply 1 patch topically 2 (two) times daily.   mirtazapine 15 MG tablet Commonly known as:  REMERON Take 7.5 mg by mouth at bedtime.   nitroGLYCERIN 0.4 MG SL tablet Commonly known as:  NITROSTAT Place 1 tablet (0.4 mg total) under the tongue every 5 (five) minutes x 3 doses as needed for chest pain.   pantoprazole 40 MG tablet Commonly known as:  PROTONIX Take 1 tablet (40 mg total) by mouth 2 (two) times daily.         Time coordinating discharge: 35 min  Signed:  Geradine Girt  [facility] 02/07/2018, 12:34 PM

## 2018-02-07 NOTE — Transfer of Care (Signed)
Immediate Anesthesia Transfer of Care Note  Patient: Daniel Schmitt  Procedure(s) Performed: ESOPHAGOGASTRODUODENOSCOPY (EGD) (N/A ) BIOPSY  Patient Location: Endoscopy Unit  Anesthesia Type:MAC  Level of Consciousness: drowsy  Airway & Oxygen Therapy: Patient Spontanous Breathing and Patient connected to nasal cannula oxygen  Post-op Assessment: Report given to RN and Post -op Vital signs reviewed and stable  Post vital signs: Reviewed and stable  Last Vitals:  Vitals Value Taken Time  BP    Temp    Pulse    Resp    SpO2      Last Pain:  Vitals:   02/07/18 0500  TempSrc: Oral  PainSc:          Complications: No apparent anesthesia complications

## 2018-02-07 NOTE — Progress Notes (Signed)
Patient will DC to: [facility] Anticipated DC date: 02/07/18 Family notified: Daughter, Surveyor, mining by: Corey Harold 3:40pm   Per MD patient ready for DC to [facility]. RN, patient, patient's family, and facility notified of DC. Discharge Summary sent to facility. RN given number for report [phone] Room 606P). DC packet on chart. Ambulance transport requested for patient.   CSW signing off.  Cedric Fishman, LCSW Clinical Social Worker [phone]

## 2018-02-07 NOTE — Anesthesia Postprocedure Evaluation (Signed)
Anesthesia Post Note  Patient: Daniel Schmitt  Procedure(s) Performed: ESOPHAGOGASTRODUODENOSCOPY (EGD) (N/A ) BIOPSY     Patient location during evaluation: Endoscopy Anesthesia Type: MAC Level of consciousness: awake and alert Pain management: pain level controlled Vital Signs Assessment: post-procedure vital signs reviewed and stable Respiratory status: spontaneous breathing, nonlabored ventilation, respiratory function stable and patient connected to nasal cannula oxygen Cardiovascular status: stable and blood pressure returned to baseline Postop Assessment: no apparent nausea or vomiting Anesthetic complications: no    Last Vitals:  Vitals:   02/06/18 2226 02/07/18 0500  BP: (!) 114/56 136/67  Pulse: 96 79  Resp:    Temp: (!) 36.4 C (!) 36.4 C  SpO2: 97% 97%    Last Pain:  Vitals:   02/07/18 0815  TempSrc:   PainSc: Asleep   Pain Goal:                 [name]

## 2018-02-08 ENCOUNTER — Encounter (HOSPITAL_COMMUNITY): Payer: Self-pay | Admitting: Gastroenterology

## 2018-02-08 DIAGNOSIS — N189 Chronic kidney disease, unspecified: Secondary | ICD-10-CM | POA: Diagnosis not present

## 2018-02-08 DIAGNOSIS — S72002D Fracture of unspecified part of neck of left femur, subsequent encounter for closed fracture with routine healing: Secondary | ICD-10-CM | POA: Diagnosis not present

## 2018-02-08 DIAGNOSIS — D649 Anemia, unspecified: Secondary | ICD-10-CM | POA: Diagnosis not present

## 2018-02-08 DIAGNOSIS — K922 Gastrointestinal hemorrhage, unspecified: Secondary | ICD-10-CM | POA: Diagnosis not present

## 2018-02-09 ENCOUNTER — Encounter: Payer: Self-pay | Admitting: Gastroenterology

## 2018-02-13 DIAGNOSIS — R630 Anorexia: Secondary | ICD-10-CM | POA: Diagnosis not present

## 2018-02-13 DIAGNOSIS — Z9181 History of falling: Secondary | ICD-10-CM | POA: Diagnosis not present

## 2018-02-13 DIAGNOSIS — F0390 Unspecified dementia without behavioral disturbance: Secondary | ICD-10-CM | POA: Diagnosis not present

## 2018-02-13 DIAGNOSIS — M25552 Pain in left hip: Secondary | ICD-10-CM | POA: Diagnosis not present

## 2018-02-13 DIAGNOSIS — R2689 Other abnormalities of gait and mobility: Secondary | ICD-10-CM | POA: Diagnosis not present

## 2018-02-15 DIAGNOSIS — M25552 Pain in left hip: Secondary | ICD-10-CM | POA: Diagnosis not present

## 2018-02-15 DIAGNOSIS — D649 Anemia, unspecified: Secondary | ICD-10-CM | POA: Diagnosis not present

## 2018-02-15 DIAGNOSIS — R2689 Other abnormalities of gait and mobility: Secondary | ICD-10-CM | POA: Diagnosis not present

## 2018-02-15 DIAGNOSIS — R630 Anorexia: Secondary | ICD-10-CM | POA: Diagnosis not present

## 2018-02-15 DIAGNOSIS — Z9181 History of falling: Secondary | ICD-10-CM | POA: Diagnosis not present

## 2018-02-15 DIAGNOSIS — R509 Fever, unspecified: Secondary | ICD-10-CM | POA: Diagnosis not present

## 2018-02-15 DIAGNOSIS — I959 Hypotension, unspecified: Secondary | ICD-10-CM | POA: Diagnosis not present

## 2018-02-15 DIAGNOSIS — F0390 Unspecified dementia without behavioral disturbance: Secondary | ICD-10-CM | POA: Diagnosis not present

## 2018-02-20 DIAGNOSIS — R509 Fever, unspecified: Secondary | ICD-10-CM | POA: Diagnosis not present

## 2018-02-21 DIAGNOSIS — S72002D Fracture of unspecified part of neck of left femur, subsequent encounter for closed fracture with routine healing: Secondary | ICD-10-CM | POA: Diagnosis not present

## 2018-02-21 DIAGNOSIS — F0390 Unspecified dementia without behavioral disturbance: Secondary | ICD-10-CM | POA: Diagnosis not present

## 2018-02-21 DIAGNOSIS — Z9181 History of falling: Secondary | ICD-10-CM | POA: Diagnosis not present

## 2018-02-21 DIAGNOSIS — M25552 Pain in left hip: Secondary | ICD-10-CM | POA: Diagnosis not present

## 2018-02-21 DIAGNOSIS — R2689 Other abnormalities of gait and mobility: Secondary | ICD-10-CM | POA: Diagnosis not present

## 2018-02-24 DIAGNOSIS — M25552 Pain in left hip: Secondary | ICD-10-CM | POA: Diagnosis not present

## 2018-02-24 DIAGNOSIS — F0390 Unspecified dementia without behavioral disturbance: Secondary | ICD-10-CM | POA: Diagnosis not present

## 2018-02-24 DIAGNOSIS — R2689 Other abnormalities of gait and mobility: Secondary | ICD-10-CM | POA: Diagnosis not present

## 2018-02-24 DIAGNOSIS — Z9181 History of falling: Secondary | ICD-10-CM | POA: Diagnosis not present

## 2018-02-27 DIAGNOSIS — S72002D Fracture of unspecified part of neck of left femur, subsequent encounter for closed fracture with routine healing: Secondary | ICD-10-CM | POA: Diagnosis not present

## 2018-02-27 DIAGNOSIS — D649 Anemia, unspecified: Secondary | ICD-10-CM | POA: Diagnosis not present

## 2018-02-27 DIAGNOSIS — R509 Fever, unspecified: Secondary | ICD-10-CM | POA: Diagnosis not present

## 2018-02-28 DIAGNOSIS — Z9181 History of falling: Secondary | ICD-10-CM | POA: Diagnosis not present

## 2018-02-28 DIAGNOSIS — R2689 Other abnormalities of gait and mobility: Secondary | ICD-10-CM | POA: Diagnosis not present

## 2018-02-28 DIAGNOSIS — M25552 Pain in left hip: Secondary | ICD-10-CM | POA: Diagnosis not present

## 2018-02-28 DIAGNOSIS — F0390 Unspecified dementia without behavioral disturbance: Secondary | ICD-10-CM | POA: Diagnosis not present

## 2018-03-01 DIAGNOSIS — F0390 Unspecified dementia without behavioral disturbance: Secondary | ICD-10-CM | POA: Diagnosis not present

## 2018-03-01 DIAGNOSIS — R630 Anorexia: Secondary | ICD-10-CM | POA: Diagnosis not present

## 2018-03-02 DIAGNOSIS — Z9181 History of falling: Secondary | ICD-10-CM | POA: Diagnosis not present

## 2018-03-02 DIAGNOSIS — M25552 Pain in left hip: Secondary | ICD-10-CM | POA: Diagnosis not present

## 2018-03-02 DIAGNOSIS — R2689 Other abnormalities of gait and mobility: Secondary | ICD-10-CM | POA: Diagnosis not present

## 2018-03-02 DIAGNOSIS — F0390 Unspecified dementia without behavioral disturbance: Secondary | ICD-10-CM | POA: Diagnosis not present

## 2018-03-03 DIAGNOSIS — I214 Non-ST elevation (NSTEMI) myocardial infarction: Secondary | ICD-10-CM | POA: Diagnosis not present

## 2018-03-03 DIAGNOSIS — D649 Anemia, unspecified: Secondary | ICD-10-CM | POA: Diagnosis not present

## 2018-03-03 DIAGNOSIS — R630 Anorexia: Secondary | ICD-10-CM | POA: Diagnosis not present

## 2018-03-03 DIAGNOSIS — S72002D Fracture of unspecified part of neck of left femur, subsequent encounter for closed fracture with routine healing: Secondary | ICD-10-CM | POA: Diagnosis not present

## 2018-03-09 DIAGNOSIS — G309 Alzheimer's disease, unspecified: Secondary | ICD-10-CM | POA: Diagnosis not present

## 2018-03-09 DIAGNOSIS — K219 Gastro-esophageal reflux disease without esophagitis: Secondary | ICD-10-CM | POA: Diagnosis not present

## 2018-03-09 DIAGNOSIS — I10 Essential (primary) hypertension: Secondary | ICD-10-CM | POA: Diagnosis not present

## 2018-03-09 DIAGNOSIS — E46 Unspecified protein-calorie malnutrition: Secondary | ICD-10-CM | POA: Diagnosis not present

## 2018-03-09 DIAGNOSIS — I2510 Atherosclerotic heart disease of native coronary artery without angina pectoris: Secondary | ICD-10-CM | POA: Diagnosis not present

## 2018-03-09 DIAGNOSIS — E785 Hyperlipidemia, unspecified: Secondary | ICD-10-CM | POA: Diagnosis not present

## 2018-03-09 DIAGNOSIS — E039 Hypothyroidism, unspecified: Secondary | ICD-10-CM | POA: Diagnosis not present

## 2018-03-09 DIAGNOSIS — N189 Chronic kidney disease, unspecified: Secondary | ICD-10-CM | POA: Diagnosis not present

## 2018-03-10 DIAGNOSIS — I10 Essential (primary) hypertension: Secondary | ICD-10-CM | POA: Diagnosis not present

## 2018-03-10 DIAGNOSIS — G309 Alzheimer's disease, unspecified: Secondary | ICD-10-CM | POA: Diagnosis not present

## 2018-03-10 DIAGNOSIS — I2510 Atherosclerotic heart disease of native coronary artery without angina pectoris: Secondary | ICD-10-CM | POA: Diagnosis not present

## 2018-03-10 DIAGNOSIS — K219 Gastro-esophageal reflux disease without esophagitis: Secondary | ICD-10-CM | POA: Diagnosis not present

## 2018-03-10 DIAGNOSIS — N189 Chronic kidney disease, unspecified: Secondary | ICD-10-CM | POA: Diagnosis not present

## 2018-03-10 DIAGNOSIS — E46 Unspecified protein-calorie malnutrition: Secondary | ICD-10-CM | POA: Diagnosis not present

## 2018-03-13 DIAGNOSIS — I2510 Atherosclerotic heart disease of native coronary artery without angina pectoris: Secondary | ICD-10-CM | POA: Diagnosis not present

## 2018-03-13 DIAGNOSIS — N189 Chronic kidney disease, unspecified: Secondary | ICD-10-CM | POA: Diagnosis not present

## 2018-03-13 DIAGNOSIS — K219 Gastro-esophageal reflux disease without esophagitis: Secondary | ICD-10-CM | POA: Diagnosis not present

## 2018-03-13 DIAGNOSIS — I10 Essential (primary) hypertension: Secondary | ICD-10-CM | POA: Diagnosis not present

## 2018-03-13 DIAGNOSIS — E46 Unspecified protein-calorie malnutrition: Secondary | ICD-10-CM | POA: Diagnosis not present

## 2018-03-13 DIAGNOSIS — G309 Alzheimer's disease, unspecified: Secondary | ICD-10-CM | POA: Diagnosis not present

## 2018-03-14 ENCOUNTER — Other Ambulatory Visit: Payer: Self-pay | Admitting: Physician Assistant

## 2018-03-14 DIAGNOSIS — I2510 Atherosclerotic heart disease of native coronary artery without angina pectoris: Secondary | ICD-10-CM | POA: Diagnosis not present

## 2018-03-14 DIAGNOSIS — I10 Essential (primary) hypertension: Secondary | ICD-10-CM | POA: Diagnosis not present

## 2018-03-14 DIAGNOSIS — N189 Chronic kidney disease, unspecified: Secondary | ICD-10-CM | POA: Diagnosis not present

## 2018-03-14 DIAGNOSIS — E46 Unspecified protein-calorie malnutrition: Secondary | ICD-10-CM | POA: Diagnosis not present

## 2018-03-14 DIAGNOSIS — G309 Alzheimer's disease, unspecified: Secondary | ICD-10-CM | POA: Diagnosis not present

## 2018-03-14 DIAGNOSIS — K219 Gastro-esophageal reflux disease without esophagitis: Secondary | ICD-10-CM | POA: Diagnosis not present

## 2018-03-14 NOTE — Telephone Encounter (Signed)
Pl pharmacy is requesting pantoprazole. wll Dr. Johnsie Cancel like to refill this medication. Please adress

## 2018-03-15 NOTE — Telephone Encounter (Signed)
Pam, I have not seen this patient since 2017 and Dr. Johnsie Cancel saw this patient this year.  Please ask him to refill this prescription.

## 2018-03-16 DIAGNOSIS — K219 Gastro-esophageal reflux disease without esophagitis: Secondary | ICD-10-CM | POA: Diagnosis not present

## 2018-03-16 DIAGNOSIS — E46 Unspecified protein-calorie malnutrition: Secondary | ICD-10-CM | POA: Diagnosis not present

## 2018-03-16 DIAGNOSIS — G309 Alzheimer's disease, unspecified: Secondary | ICD-10-CM | POA: Diagnosis not present

## 2018-03-16 DIAGNOSIS — N189 Chronic kidney disease, unspecified: Secondary | ICD-10-CM | POA: Diagnosis not present

## 2018-03-16 DIAGNOSIS — I10 Essential (primary) hypertension: Secondary | ICD-10-CM | POA: Diagnosis not present

## 2018-03-16 DIAGNOSIS — I2510 Atherosclerotic heart disease of native coronary artery without angina pectoris: Secondary | ICD-10-CM | POA: Diagnosis not present

## 2018-03-17 DIAGNOSIS — K219 Gastro-esophageal reflux disease without esophagitis: Secondary | ICD-10-CM | POA: Diagnosis not present

## 2018-03-17 DIAGNOSIS — N189 Chronic kidney disease, unspecified: Secondary | ICD-10-CM | POA: Diagnosis not present

## 2018-03-17 DIAGNOSIS — I2510 Atherosclerotic heart disease of native coronary artery without angina pectoris: Secondary | ICD-10-CM | POA: Diagnosis not present

## 2018-03-17 DIAGNOSIS — G309 Alzheimer's disease, unspecified: Secondary | ICD-10-CM | POA: Diagnosis not present

## 2018-03-17 DIAGNOSIS — E46 Unspecified protein-calorie malnutrition: Secondary | ICD-10-CM | POA: Diagnosis not present

## 2018-03-17 DIAGNOSIS — I10 Essential (primary) hypertension: Secondary | ICD-10-CM | POA: Diagnosis not present

## 2018-03-21 DIAGNOSIS — I10 Essential (primary) hypertension: Secondary | ICD-10-CM | POA: Diagnosis not present

## 2018-03-21 DIAGNOSIS — N189 Chronic kidney disease, unspecified: Secondary | ICD-10-CM | POA: Diagnosis not present

## 2018-03-21 DIAGNOSIS — K219 Gastro-esophageal reflux disease without esophagitis: Secondary | ICD-10-CM | POA: Diagnosis not present

## 2018-03-21 DIAGNOSIS — E46 Unspecified protein-calorie malnutrition: Secondary | ICD-10-CM | POA: Diagnosis not present

## 2018-03-21 DIAGNOSIS — I2510 Atherosclerotic heart disease of native coronary artery without angina pectoris: Secondary | ICD-10-CM | POA: Diagnosis not present

## 2018-03-21 DIAGNOSIS — G309 Alzheimer's disease, unspecified: Secondary | ICD-10-CM | POA: Diagnosis not present

## 2018-03-23 DIAGNOSIS — E039 Hypothyroidism, unspecified: Secondary | ICD-10-CM | POA: Diagnosis not present

## 2018-03-23 DIAGNOSIS — K219 Gastro-esophageal reflux disease without esophagitis: Secondary | ICD-10-CM | POA: Diagnosis not present

## 2018-03-23 DIAGNOSIS — E785 Hyperlipidemia, unspecified: Secondary | ICD-10-CM | POA: Diagnosis not present

## 2018-03-23 DIAGNOSIS — N189 Chronic kidney disease, unspecified: Secondary | ICD-10-CM | POA: Diagnosis not present

## 2018-03-23 DIAGNOSIS — I2510 Atherosclerotic heart disease of native coronary artery without angina pectoris: Secondary | ICD-10-CM | POA: Diagnosis not present

## 2018-03-23 DIAGNOSIS — I10 Essential (primary) hypertension: Secondary | ICD-10-CM | POA: Diagnosis not present

## 2018-03-23 DIAGNOSIS — G309 Alzheimer's disease, unspecified: Secondary | ICD-10-CM | POA: Diagnosis not present

## 2018-03-23 DIAGNOSIS — E46 Unspecified protein-calorie malnutrition: Secondary | ICD-10-CM | POA: Diagnosis not present

## 2018-03-24 DIAGNOSIS — E46 Unspecified protein-calorie malnutrition: Secondary | ICD-10-CM | POA: Diagnosis not present

## 2018-03-24 DIAGNOSIS — K219 Gastro-esophageal reflux disease without esophagitis: Secondary | ICD-10-CM | POA: Diagnosis not present

## 2018-03-24 DIAGNOSIS — I2510 Atherosclerotic heart disease of native coronary artery without angina pectoris: Secondary | ICD-10-CM | POA: Diagnosis not present

## 2018-03-24 DIAGNOSIS — N189 Chronic kidney disease, unspecified: Secondary | ICD-10-CM | POA: Diagnosis not present

## 2018-03-24 DIAGNOSIS — I10 Essential (primary) hypertension: Secondary | ICD-10-CM | POA: Diagnosis not present

## 2018-03-24 DIAGNOSIS — G309 Alzheimer's disease, unspecified: Secondary | ICD-10-CM | POA: Diagnosis not present

## 2018-03-27 DIAGNOSIS — N189 Chronic kidney disease, unspecified: Secondary | ICD-10-CM | POA: Diagnosis not present

## 2018-03-27 DIAGNOSIS — E46 Unspecified protein-calorie malnutrition: Secondary | ICD-10-CM | POA: Diagnosis not present

## 2018-03-27 DIAGNOSIS — I10 Essential (primary) hypertension: Secondary | ICD-10-CM | POA: Diagnosis not present

## 2018-03-27 DIAGNOSIS — I2510 Atherosclerotic heart disease of native coronary artery without angina pectoris: Secondary | ICD-10-CM | POA: Diagnosis not present

## 2018-03-27 DIAGNOSIS — G309 Alzheimer's disease, unspecified: Secondary | ICD-10-CM | POA: Diagnosis not present

## 2018-03-27 DIAGNOSIS — K219 Gastro-esophageal reflux disease without esophagitis: Secondary | ICD-10-CM | POA: Diagnosis not present

## 2018-03-28 DIAGNOSIS — N189 Chronic kidney disease, unspecified: Secondary | ICD-10-CM | POA: Diagnosis not present

## 2018-03-28 DIAGNOSIS — I10 Essential (primary) hypertension: Secondary | ICD-10-CM | POA: Diagnosis not present

## 2018-03-28 DIAGNOSIS — G309 Alzheimer's disease, unspecified: Secondary | ICD-10-CM | POA: Diagnosis not present

## 2018-03-28 DIAGNOSIS — K219 Gastro-esophageal reflux disease without esophagitis: Secondary | ICD-10-CM | POA: Diagnosis not present

## 2018-03-28 DIAGNOSIS — E46 Unspecified protein-calorie malnutrition: Secondary | ICD-10-CM | POA: Diagnosis not present

## 2018-03-28 DIAGNOSIS — I2510 Atherosclerotic heart disease of native coronary artery without angina pectoris: Secondary | ICD-10-CM | POA: Diagnosis not present

## 2018-03-29 DIAGNOSIS — I2510 Atherosclerotic heart disease of native coronary artery without angina pectoris: Secondary | ICD-10-CM | POA: Diagnosis not present

## 2018-03-29 DIAGNOSIS — K219 Gastro-esophageal reflux disease without esophagitis: Secondary | ICD-10-CM | POA: Diagnosis not present

## 2018-03-29 DIAGNOSIS — G309 Alzheimer's disease, unspecified: Secondary | ICD-10-CM | POA: Diagnosis not present

## 2018-03-29 DIAGNOSIS — E46 Unspecified protein-calorie malnutrition: Secondary | ICD-10-CM | POA: Diagnosis not present

## 2018-03-29 DIAGNOSIS — N189 Chronic kidney disease, unspecified: Secondary | ICD-10-CM | POA: Diagnosis not present

## 2018-03-29 DIAGNOSIS — I10 Essential (primary) hypertension: Secondary | ICD-10-CM | POA: Diagnosis not present

## 2018-04-01 DIAGNOSIS — I10 Essential (primary) hypertension: Secondary | ICD-10-CM | POA: Diagnosis not present

## 2018-04-01 DIAGNOSIS — K219 Gastro-esophageal reflux disease without esophagitis: Secondary | ICD-10-CM | POA: Diagnosis not present

## 2018-04-01 DIAGNOSIS — N189 Chronic kidney disease, unspecified: Secondary | ICD-10-CM | POA: Diagnosis not present

## 2018-04-01 DIAGNOSIS — E46 Unspecified protein-calorie malnutrition: Secondary | ICD-10-CM | POA: Diagnosis not present

## 2018-04-01 DIAGNOSIS — I2510 Atherosclerotic heart disease of native coronary artery without angina pectoris: Secondary | ICD-10-CM | POA: Diagnosis not present

## 2018-04-01 DIAGNOSIS — G309 Alzheimer's disease, unspecified: Secondary | ICD-10-CM | POA: Diagnosis not present

## 2018-04-02 DIAGNOSIS — N189 Chronic kidney disease, unspecified: Secondary | ICD-10-CM | POA: Diagnosis not present

## 2018-04-02 DIAGNOSIS — I2510 Atherosclerotic heart disease of native coronary artery without angina pectoris: Secondary | ICD-10-CM | POA: Diagnosis not present

## 2018-04-02 DIAGNOSIS — G309 Alzheimer's disease, unspecified: Secondary | ICD-10-CM | POA: Diagnosis not present

## 2018-04-02 DIAGNOSIS — K219 Gastro-esophageal reflux disease without esophagitis: Secondary | ICD-10-CM | POA: Diagnosis not present

## 2018-04-02 DIAGNOSIS — E46 Unspecified protein-calorie malnutrition: Secondary | ICD-10-CM | POA: Diagnosis not present

## 2018-04-02 DIAGNOSIS — I10 Essential (primary) hypertension: Secondary | ICD-10-CM | POA: Diagnosis not present

## 2018-04-03 DIAGNOSIS — E46 Unspecified protein-calorie malnutrition: Secondary | ICD-10-CM | POA: Diagnosis not present

## 2018-04-03 DIAGNOSIS — K219 Gastro-esophageal reflux disease without esophagitis: Secondary | ICD-10-CM | POA: Diagnosis not present

## 2018-04-03 DIAGNOSIS — G309 Alzheimer's disease, unspecified: Secondary | ICD-10-CM | POA: Diagnosis not present

## 2018-04-03 DIAGNOSIS — I10 Essential (primary) hypertension: Secondary | ICD-10-CM | POA: Diagnosis not present

## 2018-04-03 DIAGNOSIS — N189 Chronic kidney disease, unspecified: Secondary | ICD-10-CM | POA: Diagnosis not present

## 2018-04-03 DIAGNOSIS — I2510 Atherosclerotic heart disease of native coronary artery without angina pectoris: Secondary | ICD-10-CM | POA: Diagnosis not present

## 2018-04-05 DIAGNOSIS — N189 Chronic kidney disease, unspecified: Secondary | ICD-10-CM | POA: Diagnosis not present

## 2018-04-05 DIAGNOSIS — K219 Gastro-esophageal reflux disease without esophagitis: Secondary | ICD-10-CM | POA: Diagnosis not present

## 2018-04-05 DIAGNOSIS — I2510 Atherosclerotic heart disease of native coronary artery without angina pectoris: Secondary | ICD-10-CM | POA: Diagnosis not present

## 2018-04-05 DIAGNOSIS — E46 Unspecified protein-calorie malnutrition: Secondary | ICD-10-CM | POA: Diagnosis not present

## 2018-04-05 DIAGNOSIS — G309 Alzheimer's disease, unspecified: Secondary | ICD-10-CM | POA: Diagnosis not present

## 2018-04-05 DIAGNOSIS — I10 Essential (primary) hypertension: Secondary | ICD-10-CM | POA: Diagnosis not present

## 2018-04-12 DIAGNOSIS — N189 Chronic kidney disease, unspecified: Secondary | ICD-10-CM | POA: Diagnosis not present

## 2018-04-12 DIAGNOSIS — I10 Essential (primary) hypertension: Secondary | ICD-10-CM | POA: Diagnosis not present

## 2018-04-12 DIAGNOSIS — G309 Alzheimer's disease, unspecified: Secondary | ICD-10-CM | POA: Diagnosis not present

## 2018-04-12 DIAGNOSIS — I2510 Atherosclerotic heart disease of native coronary artery without angina pectoris: Secondary | ICD-10-CM | POA: Diagnosis not present

## 2018-04-12 DIAGNOSIS — E46 Unspecified protein-calorie malnutrition: Secondary | ICD-10-CM | POA: Diagnosis not present

## 2018-04-12 DIAGNOSIS — K219 Gastro-esophageal reflux disease without esophagitis: Secondary | ICD-10-CM | POA: Diagnosis not present

## 2018-04-19 DIAGNOSIS — E46 Unspecified protein-calorie malnutrition: Secondary | ICD-10-CM | POA: Diagnosis not present

## 2018-04-19 DIAGNOSIS — I10 Essential (primary) hypertension: Secondary | ICD-10-CM | POA: Diagnosis not present

## 2018-04-19 DIAGNOSIS — K219 Gastro-esophageal reflux disease without esophagitis: Secondary | ICD-10-CM | POA: Diagnosis not present

## 2018-04-19 DIAGNOSIS — I2510 Atherosclerotic heart disease of native coronary artery without angina pectoris: Secondary | ICD-10-CM | POA: Diagnosis not present

## 2018-04-19 DIAGNOSIS — G309 Alzheimer's disease, unspecified: Secondary | ICD-10-CM | POA: Diagnosis not present

## 2018-04-19 DIAGNOSIS — N189 Chronic kidney disease, unspecified: Secondary | ICD-10-CM | POA: Diagnosis not present

## 2018-04-23 DIAGNOSIS — E785 Hyperlipidemia, unspecified: Secondary | ICD-10-CM | POA: Diagnosis not present

## 2018-04-23 DIAGNOSIS — I2510 Atherosclerotic heart disease of native coronary artery without angina pectoris: Secondary | ICD-10-CM | POA: Diagnosis not present

## 2018-04-23 DIAGNOSIS — E039 Hypothyroidism, unspecified: Secondary | ICD-10-CM | POA: Diagnosis not present

## 2018-04-23 DIAGNOSIS — G309 Alzheimer's disease, unspecified: Secondary | ICD-10-CM | POA: Diagnosis not present

## 2018-04-23 DIAGNOSIS — E46 Unspecified protein-calorie malnutrition: Secondary | ICD-10-CM | POA: Diagnosis not present

## 2018-04-23 DIAGNOSIS — I10 Essential (primary) hypertension: Secondary | ICD-10-CM | POA: Diagnosis not present

## 2018-04-23 DIAGNOSIS — K219 Gastro-esophageal reflux disease without esophagitis: Secondary | ICD-10-CM | POA: Diagnosis not present

## 2018-04-23 DIAGNOSIS — N189 Chronic kidney disease, unspecified: Secondary | ICD-10-CM | POA: Diagnosis not present

## 2018-05-02 DIAGNOSIS — G309 Alzheimer's disease, unspecified: Secondary | ICD-10-CM | POA: Diagnosis not present

## 2018-05-02 DIAGNOSIS — N189 Chronic kidney disease, unspecified: Secondary | ICD-10-CM | POA: Diagnosis not present

## 2018-05-02 DIAGNOSIS — I2510 Atherosclerotic heart disease of native coronary artery without angina pectoris: Secondary | ICD-10-CM | POA: Diagnosis not present

## 2018-05-02 DIAGNOSIS — K219 Gastro-esophageal reflux disease without esophagitis: Secondary | ICD-10-CM | POA: Diagnosis not present

## 2018-05-02 DIAGNOSIS — E46 Unspecified protein-calorie malnutrition: Secondary | ICD-10-CM | POA: Diagnosis not present

## 2018-05-02 DIAGNOSIS — I10 Essential (primary) hypertension: Secondary | ICD-10-CM | POA: Diagnosis not present

## 2018-05-03 DIAGNOSIS — K219 Gastro-esophageal reflux disease without esophagitis: Secondary | ICD-10-CM | POA: Diagnosis not present

## 2018-05-03 DIAGNOSIS — G309 Alzheimer's disease, unspecified: Secondary | ICD-10-CM | POA: Diagnosis not present

## 2018-05-03 DIAGNOSIS — E46 Unspecified protein-calorie malnutrition: Secondary | ICD-10-CM | POA: Diagnosis not present

## 2018-05-03 DIAGNOSIS — I10 Essential (primary) hypertension: Secondary | ICD-10-CM | POA: Diagnosis not present

## 2018-05-03 DIAGNOSIS — I2510 Atherosclerotic heart disease of native coronary artery without angina pectoris: Secondary | ICD-10-CM | POA: Diagnosis not present

## 2018-05-03 DIAGNOSIS — N189 Chronic kidney disease, unspecified: Secondary | ICD-10-CM | POA: Diagnosis not present

## 2018-05-10 DIAGNOSIS — I2510 Atherosclerotic heart disease of native coronary artery without angina pectoris: Secondary | ICD-10-CM | POA: Diagnosis not present

## 2018-05-10 DIAGNOSIS — N189 Chronic kidney disease, unspecified: Secondary | ICD-10-CM | POA: Diagnosis not present

## 2018-05-10 DIAGNOSIS — I10 Essential (primary) hypertension: Secondary | ICD-10-CM | POA: Diagnosis not present

## 2018-05-10 DIAGNOSIS — G309 Alzheimer's disease, unspecified: Secondary | ICD-10-CM | POA: Diagnosis not present

## 2018-05-10 DIAGNOSIS — K219 Gastro-esophageal reflux disease without esophagitis: Secondary | ICD-10-CM | POA: Diagnosis not present

## 2018-05-10 DIAGNOSIS — E46 Unspecified protein-calorie malnutrition: Secondary | ICD-10-CM | POA: Diagnosis not present

## 2018-05-17 DIAGNOSIS — N189 Chronic kidney disease, unspecified: Secondary | ICD-10-CM | POA: Diagnosis not present

## 2018-05-17 DIAGNOSIS — I2510 Atherosclerotic heart disease of native coronary artery without angina pectoris: Secondary | ICD-10-CM | POA: Diagnosis not present

## 2018-05-17 DIAGNOSIS — K219 Gastro-esophageal reflux disease without esophagitis: Secondary | ICD-10-CM | POA: Diagnosis not present

## 2018-05-17 DIAGNOSIS — I10 Essential (primary) hypertension: Secondary | ICD-10-CM | POA: Diagnosis not present

## 2018-05-17 DIAGNOSIS — E46 Unspecified protein-calorie malnutrition: Secondary | ICD-10-CM | POA: Diagnosis not present

## 2018-05-17 DIAGNOSIS — G309 Alzheimer's disease, unspecified: Secondary | ICD-10-CM | POA: Diagnosis not present

## 2018-05-18 DIAGNOSIS — E46 Unspecified protein-calorie malnutrition: Secondary | ICD-10-CM | POA: Diagnosis not present

## 2018-05-18 DIAGNOSIS — N189 Chronic kidney disease, unspecified: Secondary | ICD-10-CM | POA: Diagnosis not present

## 2018-05-18 DIAGNOSIS — G309 Alzheimer's disease, unspecified: Secondary | ICD-10-CM | POA: Diagnosis not present

## 2018-05-18 DIAGNOSIS — K219 Gastro-esophageal reflux disease without esophagitis: Secondary | ICD-10-CM | POA: Diagnosis not present

## 2018-05-18 DIAGNOSIS — I10 Essential (primary) hypertension: Secondary | ICD-10-CM | POA: Diagnosis not present

## 2018-05-18 DIAGNOSIS — I2510 Atherosclerotic heart disease of native coronary artery without angina pectoris: Secondary | ICD-10-CM | POA: Diagnosis not present

## 2018-05-23 DIAGNOSIS — E039 Hypothyroidism, unspecified: Secondary | ICD-10-CM | POA: Diagnosis not present

## 2018-05-23 DIAGNOSIS — I10 Essential (primary) hypertension: Secondary | ICD-10-CM | POA: Diagnosis not present

## 2018-05-23 DIAGNOSIS — E785 Hyperlipidemia, unspecified: Secondary | ICD-10-CM | POA: Diagnosis not present

## 2018-05-23 DIAGNOSIS — K219 Gastro-esophageal reflux disease without esophagitis: Secondary | ICD-10-CM | POA: Diagnosis not present

## 2018-05-23 DIAGNOSIS — N189 Chronic kidney disease, unspecified: Secondary | ICD-10-CM | POA: Diagnosis not present

## 2018-05-23 DIAGNOSIS — I2510 Atherosclerotic heart disease of native coronary artery without angina pectoris: Secondary | ICD-10-CM | POA: Diagnosis not present

## 2018-05-23 DIAGNOSIS — E46 Unspecified protein-calorie malnutrition: Secondary | ICD-10-CM | POA: Diagnosis not present

## 2018-05-23 DIAGNOSIS — G309 Alzheimer's disease, unspecified: Secondary | ICD-10-CM | POA: Diagnosis not present

## 2018-05-24 DIAGNOSIS — I2510 Atherosclerotic heart disease of native coronary artery without angina pectoris: Secondary | ICD-10-CM | POA: Diagnosis not present

## 2018-05-24 DIAGNOSIS — N189 Chronic kidney disease, unspecified: Secondary | ICD-10-CM | POA: Diagnosis not present

## 2018-05-24 DIAGNOSIS — G309 Alzheimer's disease, unspecified: Secondary | ICD-10-CM | POA: Diagnosis not present

## 2018-05-24 DIAGNOSIS — E46 Unspecified protein-calorie malnutrition: Secondary | ICD-10-CM | POA: Diagnosis not present

## 2018-05-24 DIAGNOSIS — K219 Gastro-esophageal reflux disease without esophagitis: Secondary | ICD-10-CM | POA: Diagnosis not present

## 2018-05-24 DIAGNOSIS — I10 Essential (primary) hypertension: Secondary | ICD-10-CM | POA: Diagnosis not present

## 2018-05-31 DIAGNOSIS — K219 Gastro-esophageal reflux disease without esophagitis: Secondary | ICD-10-CM | POA: Diagnosis not present

## 2018-05-31 DIAGNOSIS — N189 Chronic kidney disease, unspecified: Secondary | ICD-10-CM | POA: Diagnosis not present

## 2018-05-31 DIAGNOSIS — I2510 Atherosclerotic heart disease of native coronary artery without angina pectoris: Secondary | ICD-10-CM | POA: Diagnosis not present

## 2018-05-31 DIAGNOSIS — I10 Essential (primary) hypertension: Secondary | ICD-10-CM | POA: Diagnosis not present

## 2018-05-31 DIAGNOSIS — G309 Alzheimer's disease, unspecified: Secondary | ICD-10-CM | POA: Diagnosis not present

## 2018-05-31 DIAGNOSIS — E46 Unspecified protein-calorie malnutrition: Secondary | ICD-10-CM | POA: Diagnosis not present

## 2018-06-13 DIAGNOSIS — G309 Alzheimer's disease, unspecified: Secondary | ICD-10-CM | POA: Diagnosis not present

## 2018-06-13 DIAGNOSIS — I10 Essential (primary) hypertension: Secondary | ICD-10-CM | POA: Diagnosis not present

## 2018-06-13 DIAGNOSIS — E46 Unspecified protein-calorie malnutrition: Secondary | ICD-10-CM | POA: Diagnosis not present

## 2018-06-13 DIAGNOSIS — K219 Gastro-esophageal reflux disease without esophagitis: Secondary | ICD-10-CM | POA: Diagnosis not present

## 2018-06-13 DIAGNOSIS — N189 Chronic kidney disease, unspecified: Secondary | ICD-10-CM | POA: Diagnosis not present

## 2018-06-13 DIAGNOSIS — I2510 Atherosclerotic heart disease of native coronary artery without angina pectoris: Secondary | ICD-10-CM | POA: Diagnosis not present

## 2018-06-14 DIAGNOSIS — I2510 Atherosclerotic heart disease of native coronary artery without angina pectoris: Secondary | ICD-10-CM | POA: Diagnosis not present

## 2018-06-14 DIAGNOSIS — G309 Alzheimer's disease, unspecified: Secondary | ICD-10-CM | POA: Diagnosis not present

## 2018-06-14 DIAGNOSIS — I10 Essential (primary) hypertension: Secondary | ICD-10-CM | POA: Diagnosis not present

## 2018-06-14 DIAGNOSIS — K219 Gastro-esophageal reflux disease without esophagitis: Secondary | ICD-10-CM | POA: Diagnosis not present

## 2018-06-14 DIAGNOSIS — N189 Chronic kidney disease, unspecified: Secondary | ICD-10-CM | POA: Diagnosis not present

## 2018-06-14 DIAGNOSIS — E46 Unspecified protein-calorie malnutrition: Secondary | ICD-10-CM | POA: Diagnosis not present

## 2018-06-22 DIAGNOSIS — N189 Chronic kidney disease, unspecified: Secondary | ICD-10-CM | POA: Diagnosis not present

## 2018-06-22 DIAGNOSIS — K219 Gastro-esophageal reflux disease without esophagitis: Secondary | ICD-10-CM | POA: Diagnosis not present

## 2018-06-22 DIAGNOSIS — I2510 Atherosclerotic heart disease of native coronary artery without angina pectoris: Secondary | ICD-10-CM | POA: Diagnosis not present

## 2018-06-22 DIAGNOSIS — E46 Unspecified protein-calorie malnutrition: Secondary | ICD-10-CM | POA: Diagnosis not present

## 2018-06-22 DIAGNOSIS — G309 Alzheimer's disease, unspecified: Secondary | ICD-10-CM | POA: Diagnosis not present

## 2018-06-22 DIAGNOSIS — I10 Essential (primary) hypertension: Secondary | ICD-10-CM | POA: Diagnosis not present

## 2018-06-23 DIAGNOSIS — K219 Gastro-esophageal reflux disease without esophagitis: Secondary | ICD-10-CM | POA: Diagnosis not present

## 2018-06-23 DIAGNOSIS — E46 Unspecified protein-calorie malnutrition: Secondary | ICD-10-CM | POA: Diagnosis not present

## 2018-06-23 DIAGNOSIS — G309 Alzheimer's disease, unspecified: Secondary | ICD-10-CM | POA: Diagnosis not present

## 2018-06-23 DIAGNOSIS — E039 Hypothyroidism, unspecified: Secondary | ICD-10-CM | POA: Diagnosis not present

## 2018-06-23 DIAGNOSIS — I10 Essential (primary) hypertension: Secondary | ICD-10-CM | POA: Diagnosis not present

## 2018-06-23 DIAGNOSIS — E785 Hyperlipidemia, unspecified: Secondary | ICD-10-CM | POA: Diagnosis not present

## 2018-06-23 DIAGNOSIS — N189 Chronic kidney disease, unspecified: Secondary | ICD-10-CM | POA: Diagnosis not present

## 2018-06-23 DIAGNOSIS — I2510 Atherosclerotic heart disease of native coronary artery without angina pectoris: Secondary | ICD-10-CM | POA: Diagnosis not present

## 2018-06-28 DIAGNOSIS — I10 Essential (primary) hypertension: Secondary | ICD-10-CM | POA: Diagnosis not present

## 2018-06-28 DIAGNOSIS — N189 Chronic kidney disease, unspecified: Secondary | ICD-10-CM | POA: Diagnosis not present

## 2018-06-28 DIAGNOSIS — G309 Alzheimer's disease, unspecified: Secondary | ICD-10-CM | POA: Diagnosis not present

## 2018-06-28 DIAGNOSIS — K219 Gastro-esophageal reflux disease without esophagitis: Secondary | ICD-10-CM | POA: Diagnosis not present

## 2018-06-28 DIAGNOSIS — E46 Unspecified protein-calorie malnutrition: Secondary | ICD-10-CM | POA: Diagnosis not present

## 2018-06-28 DIAGNOSIS — I2510 Atherosclerotic heart disease of native coronary artery without angina pectoris: Secondary | ICD-10-CM | POA: Diagnosis not present

## 2018-06-29 DIAGNOSIS — I10 Essential (primary) hypertension: Secondary | ICD-10-CM | POA: Diagnosis not present

## 2018-06-29 DIAGNOSIS — I2510 Atherosclerotic heart disease of native coronary artery without angina pectoris: Secondary | ICD-10-CM | POA: Diagnosis not present

## 2018-06-29 DIAGNOSIS — E46 Unspecified protein-calorie malnutrition: Secondary | ICD-10-CM | POA: Diagnosis not present

## 2018-06-29 DIAGNOSIS — G309 Alzheimer's disease, unspecified: Secondary | ICD-10-CM | POA: Diagnosis not present

## 2018-06-29 DIAGNOSIS — K219 Gastro-esophageal reflux disease without esophagitis: Secondary | ICD-10-CM | POA: Diagnosis not present

## 2018-06-29 DIAGNOSIS — N189 Chronic kidney disease, unspecified: Secondary | ICD-10-CM | POA: Diagnosis not present

## 2018-07-10 DIAGNOSIS — N189 Chronic kidney disease, unspecified: Secondary | ICD-10-CM | POA: Diagnosis not present

## 2018-07-10 DIAGNOSIS — I10 Essential (primary) hypertension: Secondary | ICD-10-CM | POA: Diagnosis not present

## 2018-07-10 DIAGNOSIS — K219 Gastro-esophageal reflux disease without esophagitis: Secondary | ICD-10-CM | POA: Diagnosis not present

## 2018-07-10 DIAGNOSIS — E46 Unspecified protein-calorie malnutrition: Secondary | ICD-10-CM | POA: Diagnosis not present

## 2018-07-10 DIAGNOSIS — I2510 Atherosclerotic heart disease of native coronary artery without angina pectoris: Secondary | ICD-10-CM | POA: Diagnosis not present

## 2018-07-10 DIAGNOSIS — G309 Alzheimer's disease, unspecified: Secondary | ICD-10-CM | POA: Diagnosis not present

## 2018-07-12 DIAGNOSIS — N189 Chronic kidney disease, unspecified: Secondary | ICD-10-CM | POA: Diagnosis not present

## 2018-07-12 DIAGNOSIS — K219 Gastro-esophageal reflux disease without esophagitis: Secondary | ICD-10-CM | POA: Diagnosis not present

## 2018-07-12 DIAGNOSIS — I10 Essential (primary) hypertension: Secondary | ICD-10-CM | POA: Diagnosis not present

## 2018-07-12 DIAGNOSIS — I2510 Atherosclerotic heart disease of native coronary artery without angina pectoris: Secondary | ICD-10-CM | POA: Diagnosis not present

## 2018-07-12 DIAGNOSIS — G309 Alzheimer's disease, unspecified: Secondary | ICD-10-CM | POA: Diagnosis not present

## 2018-07-12 DIAGNOSIS — E46 Unspecified protein-calorie malnutrition: Secondary | ICD-10-CM | POA: Diagnosis not present

## 2018-07-13 DIAGNOSIS — G309 Alzheimer's disease, unspecified: Secondary | ICD-10-CM | POA: Diagnosis not present

## 2018-07-13 DIAGNOSIS — I2510 Atherosclerotic heart disease of native coronary artery without angina pectoris: Secondary | ICD-10-CM | POA: Diagnosis not present

## 2018-07-13 DIAGNOSIS — E46 Unspecified protein-calorie malnutrition: Secondary | ICD-10-CM | POA: Diagnosis not present

## 2018-07-13 DIAGNOSIS — K219 Gastro-esophageal reflux disease without esophagitis: Secondary | ICD-10-CM | POA: Diagnosis not present

## 2018-07-13 DIAGNOSIS — N189 Chronic kidney disease, unspecified: Secondary | ICD-10-CM | POA: Diagnosis not present

## 2018-07-13 DIAGNOSIS — I10 Essential (primary) hypertension: Secondary | ICD-10-CM | POA: Diagnosis not present

## 2018-07-19 DIAGNOSIS — I2510 Atherosclerotic heart disease of native coronary artery without angina pectoris: Secondary | ICD-10-CM | POA: Diagnosis not present

## 2018-07-19 DIAGNOSIS — G309 Alzheimer's disease, unspecified: Secondary | ICD-10-CM | POA: Diagnosis not present

## 2018-07-19 DIAGNOSIS — N189 Chronic kidney disease, unspecified: Secondary | ICD-10-CM | POA: Diagnosis not present

## 2018-07-19 DIAGNOSIS — I10 Essential (primary) hypertension: Secondary | ICD-10-CM | POA: Diagnosis not present

## 2018-07-19 DIAGNOSIS — K219 Gastro-esophageal reflux disease without esophagitis: Secondary | ICD-10-CM | POA: Diagnosis not present

## 2018-07-19 DIAGNOSIS — E46 Unspecified protein-calorie malnutrition: Secondary | ICD-10-CM | POA: Diagnosis not present

## 2018-07-23 DIAGNOSIS — E785 Hyperlipidemia, unspecified: Secondary | ICD-10-CM | POA: Diagnosis not present

## 2018-07-23 DIAGNOSIS — I2510 Atherosclerotic heart disease of native coronary artery without angina pectoris: Secondary | ICD-10-CM | POA: Diagnosis not present

## 2018-07-23 DIAGNOSIS — I10 Essential (primary) hypertension: Secondary | ICD-10-CM | POA: Diagnosis not present

## 2018-07-23 DIAGNOSIS — N189 Chronic kidney disease, unspecified: Secondary | ICD-10-CM | POA: Diagnosis not present

## 2018-07-23 DIAGNOSIS — E039 Hypothyroidism, unspecified: Secondary | ICD-10-CM | POA: Diagnosis not present

## 2018-07-23 DIAGNOSIS — G309 Alzheimer's disease, unspecified: Secondary | ICD-10-CM | POA: Diagnosis not present

## 2018-07-23 DIAGNOSIS — E46 Unspecified protein-calorie malnutrition: Secondary | ICD-10-CM | POA: Diagnosis not present

## 2018-07-23 DIAGNOSIS — K219 Gastro-esophageal reflux disease without esophagitis: Secondary | ICD-10-CM | POA: Diagnosis not present

## 2018-07-26 DIAGNOSIS — I2510 Atherosclerotic heart disease of native coronary artery without angina pectoris: Secondary | ICD-10-CM | POA: Diagnosis not present

## 2018-07-26 DIAGNOSIS — G309 Alzheimer's disease, unspecified: Secondary | ICD-10-CM | POA: Diagnosis not present

## 2018-07-26 DIAGNOSIS — I10 Essential (primary) hypertension: Secondary | ICD-10-CM | POA: Diagnosis not present

## 2018-07-26 DIAGNOSIS — E46 Unspecified protein-calorie malnutrition: Secondary | ICD-10-CM | POA: Diagnosis not present

## 2018-07-26 DIAGNOSIS — K219 Gastro-esophageal reflux disease without esophagitis: Secondary | ICD-10-CM | POA: Diagnosis not present

## 2018-07-26 DIAGNOSIS — N189 Chronic kidney disease, unspecified: Secondary | ICD-10-CM | POA: Diagnosis not present

## 2018-07-27 DIAGNOSIS — N189 Chronic kidney disease, unspecified: Secondary | ICD-10-CM | POA: Diagnosis not present

## 2018-07-27 DIAGNOSIS — I10 Essential (primary) hypertension: Secondary | ICD-10-CM | POA: Diagnosis not present

## 2018-07-27 DIAGNOSIS — E46 Unspecified protein-calorie malnutrition: Secondary | ICD-10-CM | POA: Diagnosis not present

## 2018-07-27 DIAGNOSIS — G309 Alzheimer's disease, unspecified: Secondary | ICD-10-CM | POA: Diagnosis not present

## 2018-07-27 DIAGNOSIS — I2510 Atherosclerotic heart disease of native coronary artery without angina pectoris: Secondary | ICD-10-CM | POA: Diagnosis not present

## 2018-07-27 DIAGNOSIS — K219 Gastro-esophageal reflux disease without esophagitis: Secondary | ICD-10-CM | POA: Diagnosis not present

## 2018-08-02 DIAGNOSIS — I10 Essential (primary) hypertension: Secondary | ICD-10-CM | POA: Diagnosis not present

## 2018-08-02 DIAGNOSIS — K219 Gastro-esophageal reflux disease without esophagitis: Secondary | ICD-10-CM | POA: Diagnosis not present

## 2018-08-02 DIAGNOSIS — E46 Unspecified protein-calorie malnutrition: Secondary | ICD-10-CM | POA: Diagnosis not present

## 2018-08-02 DIAGNOSIS — I2510 Atherosclerotic heart disease of native coronary artery without angina pectoris: Secondary | ICD-10-CM | POA: Diagnosis not present

## 2018-08-02 DIAGNOSIS — N189 Chronic kidney disease, unspecified: Secondary | ICD-10-CM | POA: Diagnosis not present

## 2018-08-02 DIAGNOSIS — G309 Alzheimer's disease, unspecified: Secondary | ICD-10-CM | POA: Diagnosis not present

## 2018-08-08 ENCOUNTER — Emergency Department (HOSPITAL_COMMUNITY)

## 2018-08-08 ENCOUNTER — Other Ambulatory Visit: Payer: Self-pay

## 2018-08-08 ENCOUNTER — Encounter (HOSPITAL_COMMUNITY): Payer: Self-pay

## 2018-08-08 ENCOUNTER — Inpatient Hospital Stay (HOSPITAL_COMMUNITY)
Admission: EM | Admit: 2018-08-08 | Discharge: 2018-08-11 | DRG: 872 | Disposition: A | Discharge status: Hospice | Attending: Internal Medicine | Admitting: Internal Medicine

## 2018-08-08 DIAGNOSIS — M2550 Pain in unspecified joint: Secondary | ICD-10-CM | POA: Diagnosis not present

## 2018-08-08 DIAGNOSIS — R531 Weakness: Secondary | ICD-10-CM | POA: Diagnosis not present

## 2018-08-08 DIAGNOSIS — I959 Hypotension, unspecified: Secondary | ICD-10-CM | POA: Diagnosis not present

## 2018-08-08 DIAGNOSIS — R000 Tachycardia, unspecified: Secondary | ICD-10-CM | POA: Diagnosis not present

## 2018-08-08 DIAGNOSIS — Z886 Allergy status to analgesic agent status: Secondary | ICD-10-CM

## 2018-08-08 DIAGNOSIS — R109 Unspecified abdominal pain: Secondary | ICD-10-CM

## 2018-08-08 DIAGNOSIS — R627 Adult failure to thrive: Secondary | ICD-10-CM | POA: Diagnosis present

## 2018-08-08 DIAGNOSIS — Z87891 Personal history of nicotine dependence: Secondary | ICD-10-CM | POA: Diagnosis not present

## 2018-08-08 DIAGNOSIS — Z7401 Bed confinement status: Secondary | ICD-10-CM | POA: Diagnosis not present

## 2018-08-08 DIAGNOSIS — Z955 Presence of coronary angioplasty implant and graft: Secondary | ICD-10-CM

## 2018-08-08 DIAGNOSIS — Z79899 Other long term (current) drug therapy: Secondary | ICD-10-CM | POA: Diagnosis not present

## 2018-08-08 DIAGNOSIS — N184 Chronic kidney disease, stage 4 (severe): Secondary | ICD-10-CM | POA: Diagnosis present

## 2018-08-08 DIAGNOSIS — I444 Left anterior fascicular block: Secondary | ICD-10-CM | POA: Diagnosis present

## 2018-08-08 DIAGNOSIS — R509 Fever, unspecified: Secondary | ICD-10-CM

## 2018-08-08 DIAGNOSIS — R748 Abnormal levels of other serum enzymes: Secondary | ICD-10-CM | POA: Diagnosis present

## 2018-08-08 DIAGNOSIS — K2100 Gastro-esophageal reflux disease with esophagitis, without bleeding: Secondary | ICD-10-CM | POA: Diagnosis present

## 2018-08-08 DIAGNOSIS — K819 Cholecystitis, unspecified: Secondary | ICD-10-CM

## 2018-08-08 DIAGNOSIS — R402363 Coma scale, best motor response, obeys commands, at hospital admission: Secondary | ICD-10-CM | POA: Diagnosis present

## 2018-08-08 DIAGNOSIS — K8000 Calculus of gallbladder with acute cholecystitis without obstruction: Secondary | ICD-10-CM | POA: Diagnosis present

## 2018-08-08 DIAGNOSIS — R1111 Vomiting without nausea: Secondary | ICD-10-CM | POA: Diagnosis not present

## 2018-08-08 DIAGNOSIS — I2510 Atherosclerotic heart disease of native coronary artery without angina pectoris: Secondary | ICD-10-CM | POA: Diagnosis not present

## 2018-08-08 DIAGNOSIS — N179 Acute kidney failure, unspecified: Secondary | ICD-10-CM | POA: Diagnosis present

## 2018-08-08 DIAGNOSIS — Z96642 Presence of left artificial hip joint: Secondary | ICD-10-CM | POA: Diagnosis present

## 2018-08-08 DIAGNOSIS — I10 Essential (primary) hypertension: Secondary | ICD-10-CM | POA: Diagnosis not present

## 2018-08-08 DIAGNOSIS — R402143 Coma scale, eyes open, spontaneous, at hospital admission: Secondary | ICD-10-CM | POA: Diagnosis present

## 2018-08-08 DIAGNOSIS — Z8719 Personal history of other diseases of the digestive system: Secondary | ICD-10-CM

## 2018-08-08 DIAGNOSIS — K810 Acute cholecystitis: Secondary | ICD-10-CM | POA: Diagnosis present

## 2018-08-08 DIAGNOSIS — I129 Hypertensive chronic kidney disease with stage 1 through stage 4 chronic kidney disease, or unspecified chronic kidney disease: Secondary | ICD-10-CM | POA: Diagnosis present

## 2018-08-08 DIAGNOSIS — K219 Gastro-esophageal reflux disease without esophagitis: Secondary | ICD-10-CM | POA: Diagnosis not present

## 2018-08-08 DIAGNOSIS — R402253 Coma scale, best verbal response, oriented, at hospital admission: Secondary | ICD-10-CM | POA: Diagnosis present

## 2018-08-08 DIAGNOSIS — R110 Nausea: Secondary | ICD-10-CM | POA: Diagnosis not present

## 2018-08-08 DIAGNOSIS — Z66 Do not resuscitate: Secondary | ICD-10-CM | POA: Diagnosis present

## 2018-08-08 DIAGNOSIS — G309 Alzheimer's disease, unspecified: Secondary | ICD-10-CM | POA: Diagnosis not present

## 2018-08-08 DIAGNOSIS — E46 Unspecified protein-calorie malnutrition: Secondary | ICD-10-CM | POA: Diagnosis not present

## 2018-08-08 DIAGNOSIS — A419 Sepsis, unspecified organism: Principal | ICD-10-CM | POA: Diagnosis present

## 2018-08-08 DIAGNOSIS — N189 Chronic kidney disease, unspecified: Secondary | ICD-10-CM | POA: Diagnosis present

## 2018-08-08 DIAGNOSIS — Z515 Encounter for palliative care: Secondary | ICD-10-CM | POA: Diagnosis present

## 2018-08-08 DIAGNOSIS — I252 Old myocardial infarction: Secondary | ICD-10-CM

## 2018-08-08 DIAGNOSIS — F0390 Unspecified dementia without behavioral disturbance: Secondary | ICD-10-CM | POA: Diagnosis present

## 2018-08-08 HISTORY — DX: Unspecified dementia, unspecified severity, without behavioral disturbance, psychotic disturbance, mood disturbance, and anxiety: F03.90

## 2018-08-08 HISTORY — DX: Adult failure to thrive: R62.7

## 2018-08-08 LAB — COMPREHENSIVE METABOLIC PANEL
ALK PHOS: 194 U/L — AB (ref 38–126)
ALT: 112 U/L — AB (ref 0–44)
AST: 379 U/L — AB (ref 15–41)
Albumin: 3 g/dL — ABNORMAL LOW (ref 3.5–5.0)
Anion gap: 12 (ref 5–15)
BILIRUBIN TOTAL: 1.7 mg/dL — AB (ref 0.3–1.2)
BUN: 19 mg/dL (ref 8–23)
CALCIUM: 9 mg/dL (ref 8.9–10.3)
CHLORIDE: 107 mmol/L (ref 98–111)
CO2: 22 mmol/L (ref 22–32)
CREATININE: 2.2 mg/dL — AB (ref 0.61–1.24)
GFR calc Af Amer: 29 mL/min — ABNORMAL LOW (ref >60)
GFR, EST NON AFRICAN AMERICAN: 25 mL/min — AB (ref >60)
Glucose, Bld: 117 mg/dL — ABNORMAL HIGH (ref 70–99)
Potassium: 4.1 mmol/L (ref 3.5–5.1)
Sodium: 141 mmol/L (ref 135–145)
TOTAL PROTEIN: 5.8 g/dL — AB (ref 6.5–8.1)

## 2018-08-08 LAB — CBC
HCT: 27.2 % — ABNORMAL LOW (ref 39.0–52.0)
HEMATOCRIT: 35.2 % — AB (ref 39.0–52.0)
HEMOGLOBIN: 11 g/dL — AB (ref 13.0–17.0)
Hemoglobin: 8.8 g/dL — ABNORMAL LOW (ref 13.0–17.0)
MCH: 31.4 pg (ref 26.0–34.0)
MCH: 32.5 pg (ref 26.0–34.0)
MCHC: 31.3 g/dL (ref 30.0–36.0)
MCHC: 32.4 g/dL (ref 30.0–36.0)
MCV: 100.6 fL — AB (ref 80.0–100.0)
MCV: 100.4 fL — ABNORMAL HIGH (ref 80.0–100.0)
NRBC: 0 % (ref 0.0–0.2)
Platelets: 165 10*3/uL (ref 150–400)
Platelets: 129 10*3/uL — ABNORMAL LOW (ref 150–400)
RBC: 3.5 MIL/uL — ABNORMAL LOW (ref 4.22–5.81)
RBC: 2.71 MIL/uL — ABNORMAL LOW (ref 4.22–5.81)
RDW: 13.5 % (ref 11.5–15.5)
RDW: 13.9 % (ref 11.5–15.5)
WBC: 12.7 10*3/uL — ABNORMAL HIGH (ref 4.0–10.5)
WBC: 15.1 10*3/uL — ABNORMAL HIGH (ref 4.0–10.5)
nRBC: 0 % (ref 0.0–0.2)

## 2018-08-08 LAB — CREATININE, SERUM
Creatinine, Ser: 1.71 mg/dL — ABNORMAL HIGH (ref 0.61–1.24)
GFR calc Af Amer: 40 mL/min — ABNORMAL LOW (ref >60)
GFR, EST NON AFRICAN AMERICAN: 34 mL/min — AB (ref >60)

## 2018-08-08 LAB — LACTIC ACID, PLASMA: Lactic Acid, Venous: 6.9 mmol/L (ref 0.5–1.9)

## 2018-08-08 LAB — LIPASE, BLOOD: LIPASE: 285 U/L — AB (ref 11–51)

## 2018-08-08 LAB — I-STAT TROPONIN, ED: TROPONIN I, POC: 0.04 ng/mL (ref 0.00–0.08)

## 2018-08-08 LAB — PROTIME-INR
INR: 1.72
Prothrombin Time: 19.9 seconds — ABNORMAL HIGH (ref 11.4–15.2)

## 2018-08-08 LAB — I-STAT CG4 LACTIC ACID, ED: Lactic Acid, Venous: 1.92 mmol/L — ABNORMAL HIGH (ref 0.5–1.9)

## 2018-08-08 MED ORDER — METRONIDAZOLE IN NACL 5-0.79 MG/ML-% IV SOLN
500.0000 mg | Freq: Three times a day (TID) | INTRAVENOUS | Status: DC
  Administered 2018-08-09 – 2018-08-11 (×8): 500 mg via INTRAVENOUS
  Filled 2018-08-08 (×9): qty 100

## 2018-08-08 MED ORDER — HYDRALAZINE HCL 20 MG/ML IJ SOLN: 10.0000 mg | Freq: Four times a day (QID) | INTRAMUSCULAR | Status: DC | PRN

## 2018-08-08 MED ORDER — BISACODYL 10 MG RE SUPP: 10.0000 mg | Freq: Every day | RECTAL | Status: DC | PRN

## 2018-08-08 MED ORDER — SODIUM CHLORIDE 0.9 % IV BOLUS
500.0000 mL | Freq: Once | INTRAVENOUS | Status: AC
  Administered 2018-08-08: 500 mL via INTRAVENOUS

## 2018-08-08 MED ORDER — NITROGLYCERIN 0.4 MG SL SUBL: 0.4000 mg | SUBLINGUAL_TABLET | SUBLINGUAL | Status: DC | PRN

## 2018-08-08 MED ORDER — SODIUM CHLORIDE 0.9 % IV BOLUS
1000.0000 mL | Freq: Once | INTRAVENOUS | Status: AC
  Administered 2018-08-08: 1000 mL via INTRAVENOUS

## 2018-08-08 MED ORDER — ACETAMINOPHEN 325 MG PO TABS
650.0000 mg | ORAL_TABLET | Freq: Once | ORAL | Status: AC
  Administered 2018-08-08: 650 mg via ORAL
  Filled 2018-08-08: qty 2

## 2018-08-08 MED ORDER — ONDANSETRON HCL 4 MG/2ML IJ SOLN: 4.0000 mg | Freq: Four times a day (QID) | INTRAMUSCULAR | Status: DC | PRN

## 2018-08-08 MED ORDER — METRONIDAZOLE IN NACL 5-0.79 MG/ML-% IV SOLN
500.0000 mg | Freq: Once | INTRAVENOUS | Status: AC
  Administered 2018-08-08: 500 mg via INTRAVENOUS
  Filled 2018-08-08: qty 100

## 2018-08-08 MED ORDER — ONDANSETRON HCL 4 MG/2ML IJ SOLN: 4.0000 mg | Freq: Once | INTRAMUSCULAR | Status: DC

## 2018-08-08 MED ORDER — SODIUM CHLORIDE 0.9 % IV SOLN
2.0000 g | Freq: Once | INTRAVENOUS | Status: AC
  Administered 2018-08-08: 2 g via INTRAVENOUS
  Filled 2018-08-08: qty 20

## 2018-08-08 MED ORDER — PANTOPRAZOLE SODIUM 40 MG IV SOLR
40.0000 mg | Freq: Two times a day (BID) | INTRAVENOUS | Status: DC
  Administered 2018-08-08 – 2018-08-11 (×5): 40 mg via INTRAVENOUS
  Filled 2018-08-08 (×5): qty 40

## 2018-08-08 MED ORDER — HEPARIN SODIUM (PORCINE) 5000 UNIT/ML IJ SOLN
5000.0000 [IU] | Freq: Three times a day (TID) | INTRAMUSCULAR | Status: DC
  Administered 2018-08-08 – 2018-08-09 (×2): 5000 [IU] via SUBCUTANEOUS
  Filled 2018-08-08 (×2): qty 1

## 2018-08-08 MED ORDER — MORPHINE SULFATE (PF) 2 MG/ML IV SOLN
2.0000 mg | INTRAVENOUS | Status: DC | PRN
  Administered 2018-08-09 – 2018-08-10 (×4): 2 mg via INTRAVENOUS
  Filled 2018-08-08 (×5): qty 1

## 2018-08-08 MED ORDER — SODIUM CHLORIDE 0.9 % IV SOLN
1.0000 g | INTRAVENOUS | Status: DC
  Administered 2018-08-09 – 2018-08-10 (×2): 1 g via INTRAVENOUS
  Filled 2018-08-08 (×2): qty 10

## 2018-08-08 MED ORDER — LACTATED RINGERS IV SOLN
INTRAVENOUS | Status: AC
  Administered 2018-08-08 – 2018-08-09 (×2): via INTRAVENOUS

## 2018-08-08 NOTE — ED Notes (Signed)
XR at bedside

## 2018-08-08 NOTE — H&P (Signed)
TRH H&P   Patient Demographics:    Daniel Schmitt, is a 84 y.o. male  MRN: [id number]   DOB - Mar 28, 1926  Admit Date - 08/08/2018  Outpatient Primary MD for the patient is [name], MD  Patient coming from: Home with hospice  Chief Complaint  Patient presents with  . Emesis      HPI:    Daniel Schmitt  is a 84 y.o. male, with history of early dementia, basal cell carcinoma of the face, and STEMI with stent to mid RCA in 2017, dyslipidemia, recent hip fracture after which he was home hospice under hosp[facility] of [facility].  According to the daughter they were told by the hospice facility that they can be utilized for providing services and that if patient gets sick hospice can be rescinded and patient can be admitted to the hospital anytime.  Family did not have full understanding of hospice i.e. comfort care etc, she also said that [facility] had provided extensive care to their mom who recently passed away with malignancy.  Following the family patient was doing well until 2 days ago when he started complaining of some heartburn, this morning he was found throwing up and was shaking with fevers, he was brought to the ER where CT scan and right upper quadrant ultrasound was suspicious for cholecystitis, lab work showed elevated liver enzymes, general surgery was consulted for acute cholecystitis but they decided that patient is a poor surgical candidate due to his advanced age and recommended IR be consulted for a drain placement.  I was requested to admit.  Patient currently is relatively symptom-free, denies any abdominal pain, nausea vomiting has improved, currently afebrile, denies any headache chest pain  palpitations or shortness of breath.  No other subjective complaints.   Review of systems:    A full 10 point Review of Systems was done, except as stated above, all other Review of Systems were negative.   With Past History of the following :    Past Medical History:  Diagnosis Date  . Basal cell carcinoma (BCC) of face 10/11/2017  . Dementia ([place])   . Failure to thrive in adult   . NSTEMI (non-ST elevation myocardial infarction) ([place]) 01/2016   a.3.0 x 38 Synergy DES to mid RCA 01/2016  . Vertigo       Past  Surgical History:  Procedure Laterality Date  . BIOPSY  02/06/2018   Procedure: BIOPSY;  Surgeon: Ladene Artist, MD;  Location: [facility];  Service: Endoscopy;;  . CARDIAC CATHETERIZATION N/A 02/17/2016   Procedure: Left Heart Cath and Coronary Angiography;  Surgeon: Jettie Booze, MD;  Location: [facility];  Service: Cardiovascular;  Laterality: N/A;  . CARDIAC CATHETERIZATION N/A 02/17/2016   Procedure: Coronary Stent Intervention;  Surgeon: Jettie Booze, MD;  Location: [facility];  Service: Cardiovascular;  Laterality: N/A;  . ESOPHAGOGASTRODUODENOSCOPY N/A 02/06/2018   Procedure: ESOPHAGOGASTRODUODENOSCOPY (EGD);  Surgeon: Ladene Artist, MD;  Location: [facility];  Service: Endoscopy;  Laterality: N/A;  . HERNIA REPAIR    . TOTAL HIP ARTHROPLASTY Left 01/26/2018   Procedure: LEFT TOTAL HIP ARTHROPLASTY ANTERIOR APPROACH;  Surgeon: Rod Can, MD;  Location: [facility];  Service: Orthopedics;  Laterality: Left;  Needs RNFA      Social History:     Social History   Tobacco Use  . Smoking status: Former Research scientist (life sciences)  . Smokeless tobacco: Never Used  . Tobacco comment: QUIT SMOKING IN THE LATE 60'S  Substance Use Topics  . Alcohol use: No        Family History :   No history of gallbladder malignancy   Home Medications:   Prior to Admission medications   Medication Sig Start Date End Date Taking? Authorizing Provider    pantoprazole (PROTONIX) 40 MG tablet TAKE 1 TABLET BY MOUTH EVERY DAY Patient taking differently: Take 40 mg by mouth daily.  03/15/18  Yes Josue Hector, MD  QUEtiapine (SEROQUEL) 25 MG tablet Take 75 mg by mouth daily.   Yes [provider]  temazepam (RESTORIL) 15 MG capsule Take 15 mg by mouth at bedtime as needed for sleep.   Yes [provider]  amLODipine (NORVASC) 10 MG tablet Take 1 tablet (10 mg total) by mouth daily. Patient not taking: Reported on 08/08/2018 07/05/16   Imogene Burn, PA-C  atorvastatin (LIPITOR) 80 MG tablet TAKE 1 TABLET BY MOUTH EVERY DAY AT 6PM Patient not taking: Reported on 08/08/2018 02/01/18   Josue Hector, MD  carvedilol (COREG) 12.5 MG tablet TAKE 1 TABLET (12.5 MG TOTAL) BY MOUTH 2 (TWO) TIMES DAILY WITH A MEAL. Patient not taking: Reported on 08/08/2018 10/11/17   Josue Hector, MD  nitroGLYCERIN (NITROSTAT) 0.4 MG SL tablet Place 1 tablet (0.4 mg total) under the tongue every 5 (five) minutes x 3 doses as needed for chest pain. Patient not taking: Reported on 08/08/2018 11/07/17   Josue Hector, MD     Allergies:     Allergies  Allergen Reactions  . Aspirin Other (See Comments)    Stomach bleeds      Physical Exam:   Vitals  Blood pressure (!) 94/55, pulse 82, temperature 99.9 F (37.7 C), temperature source Oral, resp. rate 20, SpO2 94 %.   1. General frail elderly white gentleman lying in hospital bed in no apparent discomfort,  2. Normal affect and insight, Not Suicidal or Homicidal, Awake Alert, Oriented X 1.  3. No F.N deficits, ALL C.Nerves Intact, Strength 5/5 all 4 extremities, Sensation intact all 4 extremities, Plantars down going.  4. Ears and Eyes appear Normal, Conjunctivae clear, PERRLA. Moist Oral Mucosa.  5. Supple Neck, No JVD, No cervical lymphadenopathy appriciated, No Carotid Bruits.  6. Symmetrical Chest wall movement, Good air movement bilaterally, CTAB.  7. RRR, No Gallops, Rubs or  Murmurs,  No Parasternal Heave.  8. Positive Bowel Sounds, Abdomen Soft, No RUQ tenderness, No organomegaly appriciated,No rebound -guarding or rigidity.  9.  No Cyanosis, Normal Skin Turgor, No Skin Rash or Bruise.  10. Good muscle tone,  joints appear normal , no effusions, Normal ROM.  11. No Palpable Lymph Nodes in Neck or Axillae      Data Review:    CBC Recent Labs  Lab 08/08/18 0916  WBC 12.7*  HGB 11.0*  HCT 35.2*  PLT 165  MCV 100.6*  MCH 31.4  MCHC 31.3  RDW 13.5   ------------------------------------------------------------------------------------------------------------------  Chemistries  Recent Labs  Lab 08/08/18 0916  NA 141  K 4.1  CL 107  CO2 22  GLUCOSE 117*  BUN 19  CREATININE 2.20*  CALCIUM 9.0  AST 379*  ALT 112*  ALKPHOS 194*  BILITOT 1.7*   ------------------------------------------------------------------------------------------------------------------ CrCl cannot be calculated (Unknown ideal weight.). ------------------------------------------------------------------------------------------------------------------ No results for input(s): TSH, T4TOTAL, T3FREE, THYROIDAB in the last 72 hours.  Invalid input(s): FREET3  Coagulation profile No results for input(s): INR, PROTIME in the last 168 hours. ------------------------------------------------------------------------------------------------------------------- No results for input(s): DDIMER in the last 72 hours. -------------------------------------------------------------------------------------------------------------------  Cardiac Enzymes No results for input(s): CKMB, TROPONINI, MYOGLOBIN in the last 168 hours.  Invalid input(s): CK ------------------------------------------------------------------------------------------------------------------ No results found for:  BNP   ---------------------------------------------------------------------------------------------------------------  Urinalysis    Component Value Date/Time   COLORURINE YELLOW 02/16/2016 2058   APPEARANCEUR CLEAR 02/16/2016 2058   LABSPEC 1.025 02/16/2016 2058   PHURINE 5.0 02/16/2016 2058   GLUCOSEU NEGATIVE 02/16/2016 2058   HGBUR NEGATIVE 02/16/2016 2058   BILIRUBINUR NEGATIVE 02/16/2016 2058   KETONESUR NEGATIVE 02/16/2016 2058   PROTEINUR NEGATIVE 02/16/2016 2058   UROBILINOGEN 0.2 02/11/2013 1353   NITRITE NEGATIVE 02/16/2016 2058   LEUKOCYTESUR NEGATIVE 02/16/2016 2058    ----------------------------------------------------------------------------------------------------------------   Imaging Results:    Ct Abdomen Pelvis Wo Contrast  Result Date: 08/08/2018 CLINICAL DATA:  84 year old with decreased oral intake for the past several weeks according to the patient's family, presenting now with acute onset of vomiting. EXAM: CT ABDOMEN AND PELVIS WITHOUT CONTRAST TECHNIQUE: Multidetector CT imaging of the abdomen and pelvis was performed following the standard protocol without IV contrast. COMPARISON:  None. FINDINGS: Beam hardening streak artifact over the upper abdomen as the patient is unable to raise the arms. Metallic beam hardening streak artifact related to the patient's LEFT hip prosthesis. Lower chest: Dependent atelectasis in the lower lobes bilaterally, RIGHT greater than LEFT. BILATERAL lower lobe bronchiectasis. Heart mildly enlarged. RIGHT coronary artery stent. Hepatobiliary: Normal unenhanced appearance of the liver. Very small gallstones are suspected in the dependent portion of the gallbladder. Gallbladder wall thickening up to approximately 7 mm. Pancreas: Normal unenhanced appearance. Spleen: Normal unenhanced appearance. Adrenals/Urinary Tract: Normal appearing adrenal glands. Diffuse cortical thinning involving both kidneys, RIGHT greater than LEFT. Allowing  for the unenhanced technique, no focal parenchymal abnormality involving either kidney. No hydronephrosis. No urinary tract calculi. Normal appearing urinary bladder. Stomach/Bowel: Stomach normal in appearance for the degree of distention. Normal-appearing small bowel. Mobile cecum positioned in the RIGHT UPPER QUADRANT. Extensive distal descending and sigmoid colon diverticulosis without evidence of acute diverticulitis. Moderate rectal stool burden. Normal appendix in the RIGHT mid abdomen. 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 No evidence of aortic aneurysm. Upper normal sized retroperitoneal lymph nodes in the upper abdomen associated with edema/inflammation in the adjacent retroperitoneal fat. Reproductive: Prostate gland and seminal vesicles obscured by the metallic beam hardening artifact from the LEFT hip  prosthesis. Other: Prior LEFT inguinal hernia repair without evidence of recurrence. Musculoskeletal: Severe degenerative disc disease at L2-3 and L4-5 and moderate degenerative disc disease at L5-S1. Facet degenerative changes diffusely throughout the lumbar spine. Prior LEFT hip arthroplasty with anatomic alignment. No acute findings. Mild thoracolumbar dextroscoliosis. IMPRESSION: 1. Upper normal sized retroperitoneal lymph nodes in the upper abdomen associated with edema/inflammation in the adjacent retroperitoneal fat. Query early retroperitoneal fibrosis. 2. Gallbladder wall thickening up to 7 mm may indicate cholecystitis, either acute or chronic. Cholelithiasis is suspected. 3. Diffuse cortical thinning involving both kidneys, RIGHT greater than LEFT. 4. Extensive distal descending and sigmoid colon diverticulosis without evidence of acute diverticulitis. 5. Dependent atelectasis in the lower lobes bilaterally, RIGHT greater than LEFT. BILATERAL lower lobe bronchiectasis. 6.  Aortic Atherosclerosis (ICD10-170.0) Electronically Signed   By: Evangeline Dakin M.D.    On: 08/08/2018 11:37   Dg Chest Port 1 View  Result Date: 08/08/2018 CLINICAL DATA:  Decreased oral intake, some vomiting EXAM: PORTABLE CHEST 1 VIEW COMPARISON:  Chest x-ray of 02/17/2016 FINDINGS: No active infiltrate or effusion is seen. The lungs may be slightly hyperaerated. Mediastinal and hilar contours are unremarkable and the heart is mildly enlarged. No acute bony abnormality is seen. IMPRESSION: No active cardiopulmonary disease.  Borderline cardiomegaly. Electronically Signed   By: Ivar Drape M.D.   On: 08/08/2018 08:58   US Abdomen Limited Ruq  Result Date: 08/08/2018 CLINICAL DATA:  Right upper quadrant pain.  Vomiting. EXAM: ULTRASOUND ABDOMEN LIMITED RIGHT UPPER QUADRANT COMPARISON:  CT 08/08/2018. FINDINGS: Gallbladder: Sludge and large gallstone noted. Gallbladder wall thickness 3.6 mm. Pericholecystic fluid. Cholecystitis could not be excluded. Negative Murphy sign. Common bile duct: Diameter: 5.0 mm Liver: Left lobe not well visualized. No focal lesion identified. Within normal limits in parenchymal echogenicity. Portal vein is patent on color Doppler imaging with normal direction of blood flow towards the liver. Incidental note is made of right pleural effusion. IMPRESSION: 1. Gallbladder sludge and large gallstone. Gallbladder wall is thickened. Pericholecystic fluid noted. Cholecystitis could not be excluded. No biliary distention. 2.  Incidental note is made of right pleural effusion. Electronically Signed   By: Marcello Moores  Register   On: 08/08/2018 14:04    My personal review of EKG: Rhythm S.Tach, Rate 103 /min,   no Acute ST changes   Assessment & Plan:     1.  Nausea vomiting and fevers at home, leukocytosis and elevated LFTs.  CT evidence of possible early cholecystitis versus chronic, seen by general surgery, not a candidate for surgery per surgical team.  Discussed the plan with general surgery PA Saverio Danker.  At this time patient will be treated conservatively, bowel  rest tonight, IV fluids, IV antibiotics, IR to place a drain.  Pain and nausea control.  His abdominal exam currently is benign.  2.  Elevated LFTs.  Due to #1 above.  3.  CAD status post stent to mid RCA in 2017.  Stable, no chest pain, EKG nonacute, for now blood pressure is too low hold beta-blocker, since n.p.o. hold statin which was being used for secondary prevention.  Once blood pressure has stabilized and he is taking oral medications Coreg and statin can be resumed.  4.  Dementia.  At risk for delirium.  Minimize narcotics and benzodiazepine use, family educated about expecting delirium while he is in the hospital.  5.  Severe GERD.  IV PPI for now.  6.  Hypertension and mild sepsis.  Due to #1 above, hydrate, IV  antibiotics and follow cultures.  He does not appear toxic.    DVT Prophylaxis Heparin   AM Labs Ordered, also please review Full Orders  Family Communication: Admission, patients condition and plan of care including tests being ordered have been discussed with the patient and family who indicate understanding and agree with the plan and Code Status.  Code Status DNR  Likely DC to  SNF  Condition GUARDED    Consults called: CCS, IR  Admission status: Inpt    Time spent in minutes : 35   Lala Lund M.D on 08/08/2018 at 5:20 PM  To page go to [email or web address] - password [facility]

## 2018-08-08 NOTE — Progress Notes (Signed)
Daniel Schmitt is a 84 y.o. male patient admitted from ED awake, alert - oriented  X 4 - no acute distress noted.  VSS - Blood pressure (!) 81/50, pulse 75, temperature 98.1 F (36.7 C), temperature source Oral, resp. rate 18, SpO2 96 %.    IV in place, occlusive dsg intact without redness.  Orientation to room, and floor completed. Admission INP armband ID verified with patient/family, and in place.   SR up x 1, fall assessment complete, with patient and family able to verbalize understanding of risk associated with falls, and verbalized understanding to call nsg before up out of bed.  Call light within reach, patient able to voice, and demonstrate understanding.  Skin, clean-dry- intact with evidence of some ecchymosis on left arm. No evidence of skin break down noted on exam.     Will cont to eval and treat per MD orders.  Patience Musca, RN 08/08/2018 7:46 PM

## 2018-08-08 NOTE — Consult Note (Signed)
Daniel Schmitt November 13, 1925  [id number].    Requesting MD: Dr. Gerlene Fee, EDP Chief Complaint/Reason for Consult: acute cholecystitis  HPI:  This is a 84 year old white male with a history of dementia, failure to thrive, coronary artery disease, chronic kidney disease who recently had a hip fracture with surgical repair in June of this year.  He has recently had a spouse passing away.  He is currently on hospice care.  There is no family present and the patient is generally unable to provide any type of history.  According to the chart, he was having emesis this morning.  He was brought to the emergency department.  Upon arrival he was noted to have a temperature of 103.2.  His white blood cell count is around 12,700.  He had imaging with a CT scan showing some retroperitoneal adenopathy as well as some gallbladder wall thickening and possible gallstones.  An ultrasound was then obtained which revealed minimal gallbladder wall thickening at 3.6 mm, a large gallstone, and pericholecystic fluid, but a negative Murphy sign.  According to the EDP, the family rescinded his hospice wanting treatment.  The patient does remain a DNR/DNI.  We have been asked to evaluate the patient for further evaluation.  ROS: ROS: Please see HPI otherwise all other systems have been reviewed and are negative.  Please note the patient does have some dementia and is difficult to determine whether his answers are accurate or not.  History reviewed. No pertinent family history.  Past Medical History:  Diagnosis Date  . Basal cell carcinoma (BCC) of face 10/11/2017  . Dementia ([place])   . Failure to thrive in adult   . NSTEMI (non-ST elevation myocardial infarction) ([place]) 01/2016   a.3.0 x 38 Synergy DES to mid RCA 01/2016  . Vertigo     Past Surgical History:  Procedure Laterality Date  . BIOPSY  02/06/2018   Procedure: BIOPSY;  Surgeon: Ladene Artist, MD;  Location: [place];  Service: Endoscopy;;  .  CARDIAC CATHETERIZATION N/A 02/17/2016   Procedure: Left Heart Cath and Coronary Angiography;  Surgeon: Jettie Booze, MD;  Location: [place] [facility];  Service: Cardiovascular;  Laterality: N/A;  . CARDIAC CATHETERIZATION N/A 02/17/2016   Procedure: Coronary Stent Intervention;  Surgeon: Jettie Booze, MD;  Location: [place] [facility];  Service: Cardiovascular;  Laterality: N/A;  . ESOPHAGOGASTRODUODENOSCOPY N/A 02/06/2018   Procedure: ESOPHAGOGASTRODUODENOSCOPY (EGD);  Surgeon: Ladene Artist, MD;  Location[facility];  Service: Endoscopy;  Laterality: N/A;  . HERNIA REPAIR    . TOTAL HIP ARTHROPLASTY Left 01/26/2018   Procedure: LEFT TOTAL HIP ARTHROPLASTY ANTERIOR APPROACH;  Surgeon: Rod Can, MD;  Location: [facility];  Service: Orthopedics;  Laterality: Left;  Needs RNFA    Social History:  reports that he has quit smoking. He has never used smokeless tobacco. He reports that he does not drink alcohol or use drugs.  Allergies:  Allergies  Allergen Reactions  . Aspirin Other (See Comments)    Stomach bleeds     (Not in a hospital admission)    Physical Exam: Blood pressure (!) 83/51, pulse 83, temperature 99.9 F (37.7 C), temperature source Oral, resp. rate 19, SpO2 94 %. General: Elderly, cachectic, frail-appearing white male who is laying in bed in NAD HEENT: head is normocephalic, atraumatic.  Sclera are noninjected.  PERRL.  Ears and nose without any masses or lesions.  Mouth is pink.  Cheeks are sunken in. Heart: regular, rate,  and rhythm.  Normal s1,s2. No obvious murmurs, gallops, or rubs noted.  Palpable radial pulses bilaterally Lungs: CTAB, no wheezes, rhonchi, or rales noted.  Respiratory effort nonlabored.  Decreased breath sounds at the bases. Abd: soft, NT, ND, +BS, no masses, hernias, or organomegaly MS: all 4 extremities are symmetrical with no cyanosis, clubbing, or edema, but with significant muscular atrophy in all extremities. Skin: warm  and dry with no masses, lesions, or rashes Psych: Alert and oriented to self.  He knows he is in the hospital but thinks he is in [place].  He is unaware of the year and thinks that Rowe Clack is still president.  At times he seems to have periods of lucidity but then at times his thoughts are nonsensical.   Results for orders placed or performed during the hospital encounter of 08/08/18 (from the past 48 hour(s))  CBC     Status: Abnormal   Collection Time: 08/08/18  9:16 AM  Result Value Ref Range   WBC 12.7 (H) 4.0 - 10.5 K/uL   RBC 3.50 (L) 4.22 - 5.81 MIL/uL   Hemoglobin 11.0 (L) 13.0 - 17.0 g/dL   HCT 35.2 (L) 39.0 - 52.0 %   MCV 100.6 (H) 80.0 - 100.0 fL   MCH 31.4 26.0 - 34.0 pg   MCHC 31.3 30.0 - 36.0 g/dL   RDW 13.5 11.5 - 15.5 %   Platelets 165 150 - 400 K/uL   nRBC 0.0 0.0 - 0.2 %    Comment: Performed at [facility], [place] [address], [place], [place] [address]  Comprehensive metabolic panel     Status: Abnormal   Collection Time: 08/08/18  9:16 AM  Result Value Ref Range   Sodium 141 135 - 145 mmol/L   Potassium 4.1 3.5 - 5.1 mmol/L   Chloride 107 98 - 111 mmol/L   CO2 22 22 - 32 mmol/L   Glucose, Bld 117 (H) 70 - 99 mg/dL   BUN 19 8 - 23 mg/dL   Creatinine, Ser 2.20 (H) 0.61 - 1.24 mg/dL   Calcium 9.0 8.9 - 10.3 mg/dL   Total Protein 5.8 (L) 6.5 - 8.1 g/dL   Albumin 3.0 (L) 3.5 - 5.0 g/dL   AST 379 (H) 15 - 41 U/L   ALT 112 (H) 0 - 44 U/L   Alkaline Phosphatase 194 (H) 38 - 126 U/L   Total Bilirubin 1.7 (H) 0.3 - 1.2 mg/dL   GFR calc non Af Amer 25 (L) >60 mL/min   GFR calc Af Amer 29 (L) >60 mL/min   Anion gap 12 5 - 15    Comment: Performed at [facility], [place] [address], [place], [place] [address]  Lipase, blood     Status: Abnormal   Collection Time: 08/08/18  9:16 AM  Result Value Ref Range   Lipase 285 (H) 11 - 51 U/L    Comment: Performed at [place] [address] [place], [place] [address]  I-stat troponin, ED      Status: None   Collection Time: 08/08/18 12:32 PM  Result Value Ref Range   Troponin i, poc 0.04 0.00 - 0.08 ng/mL   Comment 3            Comment: Due to the release kinetics of cTnI, a negative result within the first hours of the onset of symptoms does not rule out myocardial infarction with certainty. If myocardial infarction is still suspected, repeat the test  at appropriate intervals.   I-Stat CG4 Lactic Acid, ED     Status: Abnormal   Collection Time: 08/08/18  3:56 PM  Result Value Ref Range   Lactic Acid, Venous 1.92 (H) 0.5 - 1.9 mmol/L   Ct Abdomen Pelvis Wo Contrast  Result Date: 08/08/2018 CLINICAL DATA:  84 year old with decreased oral intake for the past several weeks according to the patient's family, presenting now with acute onset of vomiting. EXAM: CT ABDOMEN AND PELVIS WITHOUT CONTRAST TECHNIQUE: Multidetector CT imaging of the abdomen and pelvis was performed following the standard protocol without IV contrast. COMPARISON:  None. FINDINGS: Beam hardening streak artifact over the upper abdomen as the patient is unable to raise the arms. Metallic beam hardening streak artifact related to the patient's LEFT hip prosthesis. Lower chest: Dependent atelectasis in the lower lobes bilaterally, RIGHT greater than LEFT. BILATERAL lower lobe bronchiectasis. Heart mildly enlarged. RIGHT coronary artery stent. Hepatobiliary: Normal unenhanced appearance of the liver. Very small gallstones are suspected in the dependent portion of the gallbladder. Gallbladder wall thickening up to approximately 7 mm. Pancreas: Normal unenhanced appearance. Spleen: Normal unenhanced appearance. Adrenals/Urinary Tract: Normal appearing adrenal glands. Diffuse cortical thinning involving both kidneys, RIGHT greater than LEFT. Allowing for the unenhanced technique, no focal parenchymal abnormality involving either kidney. No hydronephrosis. No urinary tract calculi. Normal appearing urinary bladder.  Stomach/Bowel: Stomach normal in appearance for the degree of distention. Normal-appearing small bowel. Mobile cecum positioned in the RIGHT UPPER QUADRANT. Extensive distal descending and sigmoid colon diverticulosis without evidence of acute diverticulitis. Moderate rectal stool burden. Normal appendix in the RIGHT mid abdomen. 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 No evidence of aortic aneurysm. Upper normal sized retroperitoneal lymph nodes in the upper abdomen associated with edema/inflammation in the adjacent retroperitoneal fat. Reproductive: Prostate gland and seminal vesicles obscured by the metallic beam hardening artifact from the LEFT hip prosthesis. Other: Prior LEFT inguinal hernia repair without evidence of recurrence. Musculoskeletal: Severe degenerative disc disease at L2-3 and L4-5 and moderate degenerative disc disease at L5-S1. Facet degenerative changes diffusely throughout the lumbar spine. Prior LEFT hip arthroplasty with anatomic alignment. No acute findings. Mild thoracolumbar dextroscoliosis. IMPRESSION: 1. Upper normal sized retroperitoneal lymph nodes in the upper abdomen associated with edema/inflammation in the adjacent retroperitoneal fat. Query early retroperitoneal fibrosis. 2. Gallbladder wall thickening up to 7 mm may indicate cholecystitis, either acute or chronic. Cholelithiasis is suspected. 3. Diffuse cortical thinning involving both kidneys, RIGHT greater than LEFT. 4. Extensive distal descending and sigmoid colon diverticulosis without evidence of acute diverticulitis. 5. Dependent atelectasis in the lower lobes bilaterally, RIGHT greater than LEFT. BILATERAL lower lobe bronchiectasis. 6.  Aortic Atherosclerosis (ICD10-170.0) Electronically Signed   By: Evangeline Dakin M.D.   On: 08/08/2018 11:37   Dg Chest Port 1 View  Result Date: 08/08/2018 CLINICAL DATA:  Decreased oral intake, some vomiting EXAM: PORTABLE CHEST 1 VIEW COMPARISON:   Chest x-ray of 02/17/2016 FINDINGS: No active infiltrate or effusion is seen. The lungs may be slightly hyperaerated. Mediastinal and hilar contours are unremarkable and the heart is mildly enlarged. No acute bony abnormality is seen. IMPRESSION: No active cardiopulmonary disease.  Borderline cardiomegaly. Electronically Signed   By: Ivar Drape M.D.   On: 08/08/2018 08:58   US Abdomen Limited Ruq  Result Date: 08/08/2018 CLINICAL DATA:  Right upper quadrant pain.  Vomiting. EXAM: ULTRASOUND ABDOMEN LIMITED RIGHT UPPER QUADRANT COMPARISON:  CT 08/08/2018. FINDINGS: Gallbladder: Sludge and large gallstone noted. Gallbladder wall thickness 3.6 mm. Pericholecystic fluid.  Cholecystitis could not be excluded. Negative Murphy sign. Common bile duct: Diameter: 5.0 mm Liver: Left lobe not well visualized. No focal lesion identified. Within normal limits in parenchymal echogenicity. Portal vein is patent on color Doppler imaging with normal direction of blood flow towards the liver. Incidental note is made of right pleural effusion. IMPRESSION: 1. Gallbladder sludge and large gallstone. Gallbladder wall is thickened. Pericholecystic fluid noted. Cholecystitis could not be excluded. No biliary distention. 2.  Incidental note is made of right pleural effusion. Electronically Signed   By: Marcello Moores  Register   On: 08/08/2018 14:04      Assessment/Plan Acute cholecystitis This is a very frail 84 year old male who likely has cholecystitis by imaging.  Currently, he is completely asymptomatic with no abdominal pain whatsoever.  Apparently he had some emesis this morning but there is currently no family present in the patient does not recall whether he had any nausea and vomiting this morning.  The patient has recently been on hospice and is a DNR/DNI.  I have been informed that apparently the family would like to rescind his hospice.  They are not currently present to discuss this.  Despite the patient's dementia and  disorientation to time and situation, he is very adamant that he is not a surgical candidate and does not wish to have surgery or any other type of intervention.  He appeared very sad and in his eyes filled with tears when I discussed with him that his family had considered resending his hospice.  Clearly, I do not think the patient is a surgical candidate.  I would recommend palliative care consult to discuss further with the family about their true wishes for the patient.  If they truly wanted to be more aggressive then we could consider percutaneous cholecystostomy drain; however, the patient was very adamant about not wanting something like this either.  I certainly think until more discussion with the family is able to occur, that conservative management with antibiotics is appropriate.  We will follow along.  Henreitta Cea, [facility] 08/08/2018, 4:13 PM Pager: [phone]

## 2018-08-08 NOTE — ED Notes (Signed)
Pt given urinal, is aware that a sample is needed.

## 2018-08-08 NOTE — ED Notes (Addendum)
(  1st contact) Son Rolley Sims: [phone] Daughter [name] ([place]):  [phone]

## 2018-08-08 NOTE — ED Notes (Signed)
Patient transported to US 

## 2018-08-08 NOTE — ED Notes (Signed)
Pt verbalized understanding that he needs to provide UA, but still does not need to use the bathroom at this time. Urinal at bedside.

## 2018-08-08 NOTE — ED Triage Notes (Signed)
Pt arrived via [facility] from home where he lives with daughter. EMS reports decreased oral intake "for weeks" per family and vomiting that started today. EMS gave 4 mg Zofran IV PTA and 300 NS IV PTA.   Pt is followed by [facility], this RN called to make them aware that pt was at our facility - [phone].

## 2018-08-08 NOTE — ED Notes (Signed)
2nd set of blood cultures collected and sent down to lab.

## 2018-08-08 NOTE — ED Notes (Signed)
Spoke with hospice RN who states she will send by hospital hospice RN

## 2018-08-08 NOTE — Progress Notes (Signed)
[facility]) ED Visit   Briefly visited pt at the bedside.  He was asleep and did not arouse when I walked into the room.  Family had just left.  Received report from RN, plan is to admit.  Awaiting blood cx as well as additional diagnostics.   [facility] will continue to follow during his admission.  Please call with any hospice related questions or concerns, [name] BSN, RN [facility] Liaison (listed in [place]) [phone]

## 2018-08-08 NOTE — ED Notes (Signed)
MD Bero notified about systolic bp's in the 07'M and 90's, pt sleeping but will wake up and talk.  Will continue to monitor until further instructions.

## 2018-08-08 NOTE — ED Notes (Signed)
Pt attempted to urinate, unable to do so.  

## 2018-08-08 NOTE — ED Provider Notes (Signed)
[facility] Emergency Department Provider Note MRN:  [id number]  Arrival date & time: 08/08/18     Chief Complaint   Emesis   History of Present Illness   Daniel Schmitt is a 84 y.o. year-old male with a history of cardiovascular disease, dementia presenting to the ED with chief complaint of emesis.  EMS reports decreased oral intake for weeks, vomiting that started today.  Fever today as well.  Patient is a hospice patient.  I was unable to obtain an accurate HPI, PMH, or ROS due to the patient's dementia.  Review of Systems  Positive for fever, decreased p.o. intake, vomiting.  Patient's Health History    Past Medical History:  Diagnosis Date  . Basal cell carcinoma (BCC) of face 10/11/2017  . Dementia ([place])   . Failure to thrive in adult   . NSTEMI (non-ST elevation myocardial infarction) ([place]) 01/2016   a.3.0 x 38 Synergy DES to mid RCA 01/2016  . Vertigo     Past Surgical History:  Procedure Laterality Date  . BIOPSY  02/06/2018   Procedure: BIOPSY;  Surgeon: Ladene Artist, MD;  Location: [place];  Service: Endoscopy;;  . CARDIAC CATHETERIZATION N/A 02/17/2016   Procedure: Left Heart Cath and Coronary Angiography;  Surgeon: Jettie Booze, MD;  Location: [place] [facility];  Service: Cardiovascular;  Laterality: N/A;  . CARDIAC CATHETERIZATION N/A 02/17/2016   Procedure: Coronary Stent Intervention;  Surgeon: Jettie Booze, MD;  Location: [place] [facility];  Service: Cardiovascular;  Laterality: N/A;  . ESOPHAGOGASTRODUODENOSCOPY N/A 02/06/2018   Procedure: ESOPHAGOGASTRODUODENOSCOPY (EGD);  Surgeon: Ladene Artist, MD;  Location: [facility] ENDOSCOPY;  Service: Endoscopy;  Laterality: N/A;  . HERNIA REPAIR    . TOTAL HIP ARTHROPLASTY Left 01/26/2018   Procedure: LEFT TOTAL HIP ARTHROPLASTY ANTERIOR APPROACH;  Surgeon: Rod Can, MD;  Location: [facility];  Service: Orthopedics;  Laterality: Left;  Needs RNFA    History reviewed. No  pertinent family history.  Social History   Socioeconomic History  . Marital status: Married    Spouse name: Not on file  . Number of children: Not on file  . Years of education: Not on file  . Highest education level: Not on file  Occupational History  . Not on file  Social Needs  . Financial resource strain: Not on file  . Food insecurity:    Worry: Not on file    Inability: Not on file  . Transportation needs:    Medical: Not on file    Non-medical: Not on file  Tobacco Use  . Smoking status: Former Research scientist (life sciences)  . Smokeless tobacco: Never Used  . Tobacco comment: QUIT SMOKING IN THE LATE 60'S  Substance and Sexual Activity  . Alcohol use: No  . Drug use: No  . Sexual activity: Not on file  Lifestyle  . Physical activity:    Days per week: Not on file    Minutes per session: Not on file  . Stress: Not on file  Relationships  . Social connections:    Talks on phone: Not on file    Gets together: Not on file    Attends religious service: Not on file    Active member of club or organization: Not on file    Attends meetings of clubs or organizations: Not on file    Relationship status: Not on file  . Intimate partner violence:    Fear of current or ex partner: Not on file  Emotionally abused: Not on file    Physically abused: Not on file    Forced sexual activity: Not on file  Other Topics Concern  . Not on file  Social History Narrative  . Not on file     Physical Exam  Vital Signs and Nursing Notes reviewed Vitals:   08/08/18 1515 08/08/18 1530  BP: (!) 80/49 (!) 83/51  Pulse: 82 83  Resp: (!) 21 19  Temp:    SpO2: (!) 89% 94%    CONSTITUTIONAL: Chronically ill-appearing, NAD NEURO:  Alert and oriented x 3, no focal deficits EYES:  eyes equal and reactive ENT/NECK:  no LAD, no JVD CARDIO: Tachycardic rate, well-perfused, normal S1 and S2 PULM:  CTAB no wheezing or rhonchi GI/GU:  normal bowel sounds, non-distended, non-tender MSK/SPINE:  No gross  deformities, no edema SKIN:  no rash, atraumatic PSYCH:  Appropriate speech and behavior  Diagnostic and Interventional Summary    EKG Interpretation  Date/Time:  Tuesday August 08 2018 08:13:44 EST Ventricular Rate:  109 PR Interval:    QRS Duration: 66 QT Interval:  317 QTC Calculation: 427 R Axis:   -42 Text Interpretation:  Sinus tachycardia Prolonged PR interval Left anterior fascicular block Low voltage, extremity and precordial leads Abnormal R-wave progression, early transition Nonspecific T abnormalities, lateral leads Confirmed by Gerlene Fee [id number]) on 08/08/2018 4:38:23 PM      Labs Reviewed  CBC - Abnormal; Notable for the following components:      Result Value   WBC 12.7 (*)    RBC 3.50 (*)    Hemoglobin 11.0 (*)    HCT 35.2 (*)    MCV 100.6 (*)    All other components within normal limits  COMPREHENSIVE METABOLIC PANEL - Abnormal; Notable for the following components:   Glucose, Bld 117 (*)    Creatinine, Ser 2.20 (*)    Total Protein 5.8 (*)    Albumin 3.0 (*)    AST 379 (*)    ALT 112 (*)    Alkaline Phosphatase 194 (*)    Total Bilirubin 1.7 (*)    GFR calc non Af Amer 25 (*)    GFR calc Af Amer 29 (*)    All other components within normal limits  LIPASE, BLOOD - Abnormal; Notable for the following components:   Lipase 285 (*)    All other components within normal limits  I-STAT CG4 LACTIC ACID, ED - Abnormal; Notable for the following components:   Lactic Acid, Venous 1.92 (*)    All other components within normal limits  CULTURE, BLOOD (SINGLE)  CULTURE, BLOOD (ROUTINE X 2) W REFLEX TO ID PANEL  URINALYSIS, ROUTINE W REFLEX MICROSCOPIC  I-STAT TROPONIN, ED    US Abdomen Limited RUQ  Final Result    CT ABDOMEN PELVIS WO CONTRAST  Final Result    DG Chest Port 1 View  Final Result      Medications  ondansetron [facility]) injection 4 mg (0 mg Intravenous Hold 08/08/18 0902)  sodium chloride 0.9 % bolus 1,000 mL (1,000 mLs Intravenous  New Bag/Given 08/08/18 1545)  sodium chloride 0.9 % bolus 1,000 mL (0 mLs Intravenous Stopped 08/08/18 1004)  acetaminophen (TYLENOL) tablet 650 mg (650 mg Oral Given 08/08/18 0900)  sodium chloride 0.9 % bolus 500 mL (0 mLs Intravenous Stopped 08/08/18 1317)  cefTRIAXone (ROCEPHIN) 2 g in sodium chloride 0.9 % 100 mL IVPB (0 g Intravenous Stopped 08/08/18 1317)  metroNIDAZOLE (FLAGYL) IVPB 500 mg (500 mg Intravenous New Bag/Given  08/08/18 1517)     Procedures Critical Care Critical Care Documentation Critical care time provided by me (excluding procedures): 45 minutes  Condition necessitating critical care: Sepsis, cholecystitis  Components of critical care management: reviewing of prior records, laboratory and imaging interpretation, frequent re-examination and reassessment of vital signs, administration of IV fluids, IV antibiotics, discussion with consulting services, discussion with family regarding end-of-life care.    ED Course and Medical Decision Making  I have reviewed the triage vital signs and the nursing notes.  Pertinent labs & imaging results that were available during my care of the patient were reviewed by me and considered in my medical decision making (see below for details).  Fever, decreased p.o. intake, vomiting in this 84 year old male, reportedly a hospice patient, unclear reason why.  No family or caregivers with the patient currently, I have called the hospice center and awaiting callback from the nurses that are familiar with him.  Also attempted to call family, no answer.  At this point it is unclear what the patient's goals are at this time.  Will provide comfort measures, start a work-up.  Clinical Course as of Aug 08 1640  Tue Aug 08, 2018  0925 Spoke with hospice nurse, who explains that patient has had a failure to thrive and poor p.o. intake in the setting of recent hip fracture as well as death of his spouse.  Placed on hospice for those reasons.   [MB]   [phone] Was given phone number of patient's daughter and medical proxy, no answer on first attempt.   [MB]  5188 Patient's medical decision-maker now at bedside, who explains that patient's hospice status was rescinded this morning in the setting of his acute symptoms of vomiting.   [MB]  1049 Patient is still DNR/DNI, but they do want work-up and treatment of any other new conditions present today.  Therefore, will likely admit for AKI, will provide empiric intra-abdominal   [MB]  1049  coverage with antibiotics and obtain CT scan given the labs today.   [MB]  4166 Patient also experienced chest pain yesterday that was improved with Zantac.  Will screen with EKG and troponin.  No chest pain currently.   [MB]  1050 Creatinine(!): 2.20 [MB]  1050 AST(!): 379 [MB]  1050 ALT(!): 112 [MB]  1050 Total Bilirubin(!): 1.7 [MB]  1050 Lipase, blood(!) [MB]  1050 Lipase(!): 285 [MB]  1546 CT concerning for cholecystitis, followed up with right upper quadrant ultrasound which is also concerning.  Discussed with family, who at this point is likely not going to pursue surgery.  Upon reassessment, patient's blood pressure has decreased into the 06T systolic.  He is less responsive than he was previously, warm extremities.  Will provide more fluid resuscitation.  I attempted to contact family, who has since left.  Was hoping to discuss the option of full comfort care, no answer.  Without further direction, will consult intensivist service.  General surgery was also contacted and is evaluating the patient currently.   [MB]    Clinical Course User Index [MB] Sedonia Small Barth Kirks, MD    Repeated discussion with family who is now at bedside, not ready for comfort care measures, will admit to hospitalist service with general surgery following.  Barth Kirks. Sedonia Small, [place] [email or web address]  Final Clinical Impressions(s) / ED Diagnoses     ICD-10-CM   1.  Cholecystitis K81.9   2. Fever R50.9 DG Chest [facility]  DG Chest Port 1 View  3. Abdominal pain R10.9 US Abdomen Limited RUQ    US Abdomen Limited RUQ  4. Sepsis, due to unspecified organism, unspecified whether acute organ dysfunction present [facility]) A41.9     ED Discharge Orders    None         Maudie Flakes, MD 08/08/18 [phone]

## 2018-08-09 ENCOUNTER — Encounter (HOSPITAL_COMMUNITY): Payer: Self-pay | Admitting: Physician Assistant

## 2018-08-09 ENCOUNTER — Inpatient Hospital Stay (HOSPITAL_COMMUNITY)

## 2018-08-09 DIAGNOSIS — K210 Gastro-esophageal reflux disease with esophagitis: Secondary | ICD-10-CM

## 2018-08-09 DIAGNOSIS — A419 Sepsis, unspecified organism: Principal | ICD-10-CM

## 2018-08-09 DIAGNOSIS — N179 Acute kidney failure, unspecified: Secondary | ICD-10-CM

## 2018-08-09 HISTORY — PX: IR PERC CHOLECYSTOSTOMY: IMG2326

## 2018-08-09 LAB — PROTIME-INR
INR: 1.36
Prothrombin Time: 16.6 seconds — ABNORMAL HIGH (ref 11.4–15.2)

## 2018-08-09 LAB — COMPREHENSIVE METABOLIC PANEL
ALBUMIN: 2.2 g/dL — AB (ref 3.5–5.0)
ALT: 130 U/L — ABNORMAL HIGH (ref 0–44)
AST: 267 U/L — ABNORMAL HIGH (ref 15–41)
Alkaline Phosphatase: 181 U/L — ABNORMAL HIGH (ref 38–126)
Anion gap: 7 (ref 5–15)
BUN: 20 mg/dL (ref 8–23)
CO2: 23 mmol/L (ref 22–32)
Calcium: 8.1 mg/dL — ABNORMAL LOW (ref 8.9–10.3)
Chloride: 110 mmol/L (ref 98–111)
Creatinine, Ser: 2.19 mg/dL — ABNORMAL HIGH (ref 0.61–1.24)
GFR calc Af Amer: 30 mL/min — ABNORMAL LOW (ref >60)
GFR calc non Af Amer: 26 mL/min — ABNORMAL LOW (ref >60)
GLUCOSE: 95 mg/dL (ref 70–99)
Potassium: 4.4 mmol/L (ref 3.5–5.1)
Sodium: 140 mmol/L (ref 135–145)
Total Bilirubin: 2.2 mg/dL — ABNORMAL HIGH (ref 0.3–1.2)
Total Protein: 4.6 g/dL — ABNORMAL LOW (ref 6.5–8.1)

## 2018-08-09 LAB — CBC
HCT: 30.1 % — ABNORMAL LOW (ref 39.0–52.0)
Hemoglobin: 9.7 g/dL — ABNORMAL LOW (ref 13.0–17.0)
MCH: 31.9 pg (ref 26.0–34.0)
MCHC: 32.2 g/dL (ref 30.0–36.0)
MCV: 99 fL (ref 80.0–100.0)
Platelets: 125 10*3/uL — ABNORMAL LOW (ref 150–400)
RBC: 3.04 MIL/uL — AB (ref 4.22–5.81)
RDW: 13.8 % (ref 11.5–15.5)
WBC: 14.8 10*3/uL — ABNORMAL HIGH (ref 4.0–10.5)
nRBC: 0 % (ref 0.0–0.2)

## 2018-08-09 LAB — LACTIC ACID, PLASMA: Lactic Acid, Venous: 1.5 mmol/L (ref 0.5–1.9)

## 2018-08-09 MED ORDER — CEFAZOLIN SODIUM-DEXTROSE 2-4 GM/100ML-% IV SOLN
INTRAVENOUS | Status: AC
  Administered 2018-08-09: 2000 mg
  Filled 2018-08-09: qty 100

## 2018-08-09 MED ORDER — MIDAZOLAM HCL 2 MG/2ML IJ SOLN
INTRAMUSCULAR | Status: AC | PRN
  Administered 2018-08-09: 1 mg via INTRAVENOUS

## 2018-08-09 MED ORDER — LIDOCAINE HCL 1 % IJ SOLN
INTRAMUSCULAR | Status: AC
  Administered 2018-08-09: 17:00:00
  Filled 2018-08-09: qty 20

## 2018-08-09 MED ORDER — HEPARIN SODIUM (PORCINE) 5000 UNIT/ML IJ SOLN
5000.0000 [IU] | Freq: Three times a day (TID) | INTRAMUSCULAR | Status: DC
  Administered 2018-08-10 – 2018-08-11 (×4): 5000 [IU] via SUBCUTANEOUS
  Filled 2018-08-09 (×4): qty 1

## 2018-08-09 MED ORDER — TECHNETIUM TC 99M MEBROFENIN IV KIT
5.0000 | PACK | Freq: Once | INTRAVENOUS | Status: AC | PRN
  Administered 2018-08-09: 5 via INTRAVENOUS

## 2018-08-09 MED ORDER — MIDAZOLAM HCL 2 MG/2ML IJ SOLN
INTRAMUSCULAR | Status: AC
  Administered 2018-08-09: 17:00:00
  Filled 2018-08-09: qty 2

## 2018-08-09 MED ORDER — FENTANYL CITRATE (PF) 100 MCG/2ML IJ SOLN
INTRAMUSCULAR | Status: AC | PRN
  Administered 2018-08-09 (×2): 25 ug via INTRAVENOUS

## 2018-08-09 MED ORDER — SODIUM CHLORIDE 0.9% FLUSH
5.0000 mL | Freq: Three times a day (TID) | INTRAVENOUS | Status: DC
  Administered 2018-08-09 – 2018-08-11 (×7): 5 mL

## 2018-08-09 MED ORDER — LIDOCAINE HCL 1 % IJ SOLN
INTRAMUSCULAR | Status: AC | PRN
  Administered 2018-08-09: 10 mL

## 2018-08-09 MED ORDER — FENTANYL CITRATE (PF) 100 MCG/2ML IJ SOLN
INTRAMUSCULAR | Status: AC
  Administered 2018-08-09: 17:00:00
  Filled 2018-08-09: qty 2

## 2018-08-09 MED ORDER — IOPAMIDOL (ISOVUE-300) INJECTION 61%
INTRAVENOUS | Status: AC
  Administered 2018-08-09: 15 mL
  Filled 2018-08-09: qty 50

## 2018-08-09 NOTE — Progress Notes (Signed)
PROGRESS NOTE        PATIENT DETAILS Name: Daniel Schmitt Age: 84 y.o. Sex: male Date of Birth: 08/08/1926 Admit Date: 08/08/2018 Admitting Physician Thurnell Lose, MD PCP:Kim, Jeneen Rinks, MD  Brief Narrative: Patient is a 84 y.o. male with history of dementia, basal cell carcinoma of the face, CAD with PCI to RCA 2017, recent hip fracture requiring left hip replacement-being followed by hospice at home-presented with vomiting, fever, elevated LFTs and leukocytosis-thought to have cholecystitis and admitted to the hospitalist service.  Subjective: Does not have RUQ pain-lying comfortably this morning.  Still has leukocytosis.  Afebrile overnight.  Assessment/Plan: Sepsis secondary to acute calculus cholecystitis: Sepsis pathophysiology has resolved.  Hardly any RUQ tenderness, but still has leukocytosis-LFTs downtrending.  Discussed with general surgery-we will undergo a HIDA scan, if positive-we will likely need a percutaneous cholecystostomy drain.  Continue empiric antimicrobial therapy and supportive care.  AKI on CKD stage IV: Creatinine relatively stable-hopefully will continue to downtrend with supportive care.  We will asked nursing staff to perform bladder scans frequently and if renal function does not improve-we can then come template further work-up including renal ultrasound etc.  CAD status post PCI to mid RCA 2017: No anginal symptoms.  Hypertension: Blood pressure soft-continue to hold all antihypertensives  GERD: Continue PPI  Dementia: At risk for delirium-currently pleasantly confused  Advanced directive/palliative care: DNR in place-followed at home with hospice care.  Very frail-elderly-best served by gentle medical treatment rather than aggressive care.  DVT Prophylaxis: Prophylactic Heparin   Code Status: Full code  Family Communication: None at bedside  Disposition Plan: Remain inpatient-requires several days of  hospitalization before discharge.  Antimicrobial agents: Anti-infectives (From admission, onward)   Start     Dose/Rate Route Frequency Ordered Stop   08/09/18 1200  cefTRIAXone (ROCEPHIN) 1 g in sodium chloride 0.9 % 100 mL IVPB     1 g 200 mL/hr over 30 Minutes Intravenous Every 24 hours 08/08/18 1717 08/16/18 1159   08/08/18 2300  metroNIDAZOLE (FLAGYL) IVPB 500 mg     500 mg 100 mL/hr over 60 Minutes Intravenous Every 8 hours 08/08/18 1717     08/08/18 1100  cefTRIAXone (ROCEPHIN) 2 g in sodium chloride 0.9 % 100 mL IVPB     2 g 200 mL/hr over 30 Minutes Intravenous  Once 08/08/18 1046 08/08/18 1317   08/08/18 1100  metroNIDAZOLE (FLAGYL) IVPB 500 mg     500 mg 100 mL/hr over 60 Minutes Intravenous  Once 08/08/18 1046 08/08/18 1641      Procedures: None  CONSULTS:  general surgery  Time spent: 25- minutes-Greater than 50% of this time was spent in counseling, explanation of diagnosis, planning of further management, and coordination of care.  MEDICATIONS: Scheduled Meds: . [START ON 08/10/2018] heparin  5,000 Units Subcutaneous Q8H  . ondansetron (ZOFRAN) IV  4 mg Intravenous Once  . pantoprazole (PROTONIX) IV  40 mg Intravenous Q12H   Continuous Infusions: . cefTRIAXone (ROCEPHIN)  IV    . lactated ringers 125 mL/hr at 08/09/18 0707  . metronidazole 500 mg (08/09/18 0605)   PRN Meds:.bisacodyl, hydrALAZINE, morphine injection, nitroGLYCERIN, ondansetron (ZOFRAN) IV   PHYSICAL EXAM: Vital signs: Vitals:   08/08/18 1645 08/08/18 1730 08/08/18 1808 08/09/18 0608  BP: (!) 87/48 (!) 81/55 (!) 81/50 (!) 101/55  Pulse: 82 72 75 63  Resp: Marland Kitchen)  22 (!) 22 18 19   Temp:   98.1 F (36.7 C) 98 F (36.7 C)  TempSrc:   Oral   SpO2: 95% 95% 96% 92%   There were no vitals filed for this visit. There is no height or weight on file to calculate BMI.   General appearance :Awake, alert, not in any distress.  Eyes:Pink conjunctiva HEENT: Atraumatic and Normocephalic Neck:  supple Resp:Good air entry bilaterally, no added sounds  CVS: S1 S2 regular GI: Bowel sounds present, Non tender and not distended with no gaurding, rigidity or rebound.No organomegaly Extremities: B/L Lower Ext shows no edema, both legs are warm to touch Musculoskeletal:No digital cyanosis Skin:No Rash, warm and dry Wounds:N/A  I have personally reviewed following labs and imaging studies  LABORATORY DATA: CBC: Recent Labs  Lab 08/08/18 0916 08/08/18 1940 08/09/18 0009  WBC 12.7* 15.1* 14.8*  HGB 11.0* 8.8* 9.7*  HCT 35.2* 27.2* 30.1*  MCV 100.6* 100.4* 99.0  PLT 165 129* 125*    Basic Metabolic Panel: Recent Labs  Lab 08/08/18 0916 08/08/18 1940 08/09/18 0009  NA 141  --  140  K 4.1  --  4.4  CL 107  --  110  CO2 22  --  23  GLUCOSE 117*  --  95  BUN 19  --  20  CREATININE 2.20* 1.71* 2.19*  CALCIUM 9.0  --  8.1*    GFR: CrCl cannot be calculated (Unknown ideal weight.).  Liver Function Tests: Recent Labs  Lab 08/08/18 0916 08/09/18 0009  AST 379* 267*  ALT 112* 130*  ALKPHOS 194* 181*  BILITOT 1.7* 2.2*  PROT 5.8* 4.6*  ALBUMIN 3.0* 2.2*   Recent Labs  Lab 08/08/18 0916  LIPASE 285*   No results for input(s): AMMONIA in the last 168 hours.  Coagulation Profile: Recent Labs  Lab 08/08/18 1940 08/09/18 0728  INR 1.72 1.36    Cardiac Enzymes: No results for input(s): CKTOTAL, CKMB, CKMBINDEX, TROPONINI in the last 168 hours.  BNP (last 3 results) No results for input(s): PROBNP in the last 8760 hours.  HbA1C: No results for input(s): HGBA1C in the last 72 hours.  CBG: No results for input(s): GLUCAP in the last 168 hours.  Lipid Profile: No results for input(s): CHOL, HDL, LDLCALC, TRIG, CHOLHDL, LDLDIRECT in the last 72 hours.  Thyroid Function Tests: No results for input(s): TSH, T4TOTAL, FREET4, T3FREE, THYROIDAB in the last 72 hours.  Anemia Panel: No results for input(s): VITAMINB12, FOLATE, FERRITIN, TIBC, IRON,  RETICCTPCT in the last 72 hours.  Urine analysis:    Component Value Date/Time   COLORURINE YELLOW 02/16/2016 2058   APPEARANCEUR CLEAR 02/16/2016 2058   LABSPEC 1.025 02/16/2016 2058   PHURINE 5.0 02/16/2016 2058   GLUCOSEU NEGATIVE 02/16/2016 2058   HGBUR NEGATIVE 02/16/2016 2058   BILIRUBINUR NEGATIVE 02/16/2016 2058   KETONESUR NEGATIVE 02/16/2016 2058   PROTEINUR NEGATIVE 02/16/2016 2058   UROBILINOGEN 0.2 02/11/2013 1353   NITRITE NEGATIVE 02/16/2016 2058   LEUKOCYTESUR NEGATIVE 02/16/2016 2058    Sepsis Labs: Lactic Acid, Venous    Component Value Date/Time   LATICACIDVEN 1.5 08/09/2018 0009    MICROBIOLOGY: Recent Results (from the past 240 hour(s))  Culture, blood (single)     Status: None (Preliminary result)   Collection Time: 08/08/18  9:16 AM  Result Value Ref Range Status   Specimen Description BLOOD RIGHT ANTECUBITAL  Final   Special Requests   Final    BOTTLES DRAWN AEROBIC AND ANAEROBIC Blood Culture  adequate volume   Culture   Final    NO GROWTH < 24 HOURS Performed at [facility], [place] [address] [place], [place] [address]    Report Status PENDING  Incomplete  Culture, blood (Routine X 2) w Reflex to ID Panel     Status: None (Preliminary result)   Collection Time: 08/08/18  9:25 AM  Result Value Ref Range Status   Specimen Description BLOOD RIGHT FOREARM  Final   Special Requests   Final    BOTTLES DRAWN AEROBIC AND ANAEROBIC Blood Culture results may not be optimal due to an inadequate volume of blood received in culture bottles   Culture   Final    NO GROWTH < 24 HOURS Performed at [facility], [place] [address] [place], [place] [address]    Report Status PENDING  Incomplete    RADIOLOGY STUDIES/RESULTS: Ct Abdomen Pelvis Wo Contrast  Result Date: 08/08/2018 CLINICAL DATA:  84 year old with decreased oral intake for the past several weeks according to the patient's family, presenting now with acute onset of vomiting. EXAM: CT  ABDOMEN AND PELVIS WITHOUT CONTRAST TECHNIQUE: Multidetector CT imaging of the abdomen and pelvis was performed following the standard protocol without IV contrast. COMPARISON:  None. FINDINGS: Beam hardening streak artifact over the upper abdomen as the patient is unable to raise the arms. Metallic beam hardening streak artifact related to the patient's LEFT hip prosthesis. Lower chest: Dependent atelectasis in the lower lobes bilaterally, RIGHT greater than LEFT. BILATERAL lower lobe bronchiectasis. Heart mildly enlarged. RIGHT coronary artery stent. Hepatobiliary: Normal unenhanced appearance of the liver. Very small gallstones are suspected in the dependent portion of the gallbladder. Gallbladder wall thickening up to approximately 7 mm. Pancreas: Normal unenhanced appearance. Spleen: Normal unenhanced appearance. Adrenals/Urinary Tract: Normal appearing adrenal glands. Diffuse cortical thinning involving both kidneys, RIGHT greater than LEFT. Allowing for the unenhanced technique, no focal parenchymal abnormality involving either kidney. No hydronephrosis. No urinary tract calculi. Normal appearing urinary bladder. Stomach/Bowel: Stomach normal in appearance for the degree of distention. Normal-appearing small bowel. Mobile cecum positioned in the RIGHT UPPER QUADRANT. Extensive distal descending and sigmoid colon diverticulosis without evidence of acute diverticulitis. Moderate rectal stool burden. Normal appendix in the RIGHT mid abdomen. 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 No evidence of aortic aneurysm. Upper normal sized retroperitoneal lymph nodes in the upper abdomen associated with edema/inflammation in the adjacent retroperitoneal fat. Reproductive: Prostate gland and seminal vesicles obscured by the metallic beam hardening artifact from the LEFT hip prosthesis. Other: Prior LEFT inguinal hernia repair without evidence of recurrence. Musculoskeletal: Severe  degenerative disc disease at L2-3 and L4-5 and moderate degenerative disc disease at L5-S1. Facet degenerative changes diffusely throughout the lumbar spine. Prior LEFT hip arthroplasty with anatomic alignment. No acute findings. Mild thoracolumbar dextroscoliosis. IMPRESSION: 1. Upper normal sized retroperitoneal lymph nodes in the upper abdomen associated with edema/inflammation in the adjacent retroperitoneal fat. Query early retroperitoneal fibrosis. 2. Gallbladder wall thickening up to 7 mm may indicate cholecystitis, either acute or chronic. Cholelithiasis is suspected. 3. Diffuse cortical thinning involving both kidneys, RIGHT greater than LEFT. 4. Extensive distal descending and sigmoid colon diverticulosis without evidence of acute diverticulitis. 5. Dependent atelectasis in the lower lobes bilaterally, RIGHT greater than LEFT. BILATERAL lower lobe bronchiectasis. 6.  Aortic Atherosclerosis (ICD10-170.0) Electronically Signed   By: Evangeline Dakin M.D.   On: 08/08/2018 11:37   Dg Chest Port 1 View  Result Date: 08/08/2018 CLINICAL DATA:  Decreased oral intake, some  vomiting EXAM: PORTABLE CHEST 1 VIEW COMPARISON:  Chest x-ray of 02/17/2016 FINDINGS: No active infiltrate or effusion is seen. The lungs may be slightly hyperaerated. Mediastinal and hilar contours are unremarkable and the heart is mildly enlarged. No acute bony abnormality is seen. IMPRESSION: No active cardiopulmonary disease.  Borderline cardiomegaly. Electronically Signed   By: Ivar Drape M.D.   On: 08/08/2018 08:58   US Abdomen Limited Ruq  Result Date: 08/08/2018 CLINICAL DATA:  Right upper quadrant pain.  Vomiting. EXAM: ULTRASOUND ABDOMEN LIMITED RIGHT UPPER QUADRANT COMPARISON:  CT 08/08/2018. FINDINGS: Gallbladder: Sludge and large gallstone noted. Gallbladder wall thickness 3.6 mm. Pericholecystic fluid. Cholecystitis could not be excluded. Negative Murphy sign. Common bile duct: Diameter: 5.0 mm Liver: Left lobe not well  visualized. No focal lesion identified. Within normal limits in parenchymal echogenicity. Portal vein is patent on color Doppler imaging with normal direction of blood flow towards the liver. Incidental note is made of right pleural effusion. IMPRESSION: 1. Gallbladder sludge and large gallstone. Gallbladder wall is thickened. Pericholecystic fluid noted. Cholecystitis could not be excluded. No biliary distention. 2.  Incidental note is made of right pleural effusion. Electronically Signed   By: Marcello Moores  Register   On: 08/08/2018 14:04     LOS: 1 day   Oren Binet, MD  [facility]  If 7PM-7AM, please contact night-coverage  Please page via [email or web address]-click on MD name and type text message  08/09/2018, 9:51 AM

## 2018-08-09 NOTE — Progress Notes (Addendum)
[facility]) Hospital GIP RN visit.  This is a related and covered GIP admission of 08/08/2018 with [facility] diagnosis of protein calorie malnutrition. Patient is DNR. Family activated EMS after patient experienced nausea, vomiting and confusion.  Hospice was notified. Patient was admitted for sepsis secondary to cholecystitis.   Visited with patient at bedside. Patient was awake and alert. No family present. Patient states he does not know what the plan is for treatment.   Medications: Rocephin 1gm IVPB QD, Flagyl 50mg  IVPB Q 8hrs.    MD notes: -Sepsis secondary to acute calculus cholecystitis: Sepsis pathophysiology has resolved.  Hardly any RUQ tenderness, but still has leukocytosis-LFTs downtrending.  Discussed with general surgery-we will undergo a HIDA scan, if positive-we will likely need a percutaneous cholecystostomy drain.  Continue empiric antimicrobial therapy and supportive care. -AKI on CKD stage IV: Creatinine relatively stable-hopefully will continue to downtrend with supportive care.  We will asked nursing staff to perform bladder scans frequently and if renal function does not improve-we can then come template further work-up including renal ultrasound etc. -Advanced directive/palliative care: DNR in place-followed at home with hospice care.  Very frail-elderly-best served by gentle medical treatment rather than aggressive care.  Goals of Care: DNR with symptom management of conditions. Patient would like to return home as soon possible.   Discharge planning: To be determined. Please Call [facility] if ambulance transport is needed at time of discharge.   Communication to [facility]: Spoke with daughter by phone and offered support.  Communication with [facility]:  Team updated.   Please call with with any hospice related questions.  Thank you,  Farrel Gordon, RN, CCM [facility] [place] [address])  [phone]

## 2018-08-09 NOTE — Progress Notes (Signed)
Subjective/Chief Complaint: do I need surgery Patient awake and alert.  He denies any abdominal pain nausea or vomiting this morning.   Objective: Vital signs in last 24 hours: Temp:  [98 F (36.7 C)-99.9 F (37.7 C)] 98 F (36.7 C) (12/18 7829) Pulse Rate:  [63-116] 63 (12/18 0608) Resp:  [18-30] 19 (12/18 0608) BP: (80-126)/(48-85) 101/55 (12/18 0608) SpO2:  [89 %-98 %] 92 % (12/18 5621) Last BM Date: (PTA)  Intake/Output from previous day: 12/17 0701 - 12/18 0700 In: 3390.4 [I.V.:1800; IV Piggyback:1590.4] Out: -  Intake/Output this shift: No intake/output data recorded.  General appearance: alert and cooperative Resp: rhonchi bilaterally GI: soft, non-tender; bowel sounds normal; no masses,  no organomegaly  Lab Results:  Recent Labs    08/08/18 1940 08/09/18 0009  WBC 15.1* 14.8*  HGB 8.8* 9.7*  HCT 27.2* 30.1*  PLT 129* 125*   BMET Recent Labs    08/08/18 0916 08/08/18 1940 08/09/18 0009  NA 141  --  140  K 4.1  --  4.4  CL 107  --  110  CO2 22  --  23  GLUCOSE 117*  --  95  BUN 19  --  20  CREATININE 2.20* 1.71* 2.19*  CALCIUM 9.0  --  8.1*   PT/INR Recent Labs    08/08/18 1940 08/09/18 0728  LABPROT 19.9* 16.6*  INR 1.72 1.36   ABG No results for input(s): PHART, HCO3 in the last 72 hours.  Invalid input(s): PCO2, PO2  Studies/Results: Ct Abdomen Pelvis Wo Contrast  Result Date: 08/08/2018 CLINICAL DATA:  84 year old with decreased oral intake for the past several weeks according to the patient's family, presenting now with acute onset of vomiting. EXAM: CT ABDOMEN AND PELVIS WITHOUT CONTRAST TECHNIQUE: Multidetector CT imaging of the abdomen and pelvis was performed following the standard protocol without IV contrast. COMPARISON:  None. FINDINGS: Beam hardening streak artifact over the upper abdomen as the patient is unable to raise the arms. Metallic beam hardening streak artifact related to the patient's LEFT hip prosthesis. Lower  chest: Dependent atelectasis in the lower lobes bilaterally, RIGHT greater than LEFT. BILATERAL lower lobe bronchiectasis. Heart mildly enlarged. RIGHT coronary artery stent. Hepatobiliary: Normal unenhanced appearance of the liver. Very small gallstones are suspected in the dependent portion of the gallbladder. Gallbladder wall thickening up to approximately 7 mm. Pancreas: Normal unenhanced appearance. Spleen: Normal unenhanced appearance. Adrenals/Urinary Tract: Normal appearing adrenal glands. Diffuse cortical thinning involving both kidneys, RIGHT greater than LEFT. Allowing for the unenhanced technique, no focal parenchymal abnormality involving either kidney. No hydronephrosis. No urinary tract calculi. Normal appearing urinary bladder. Stomach/Bowel: Stomach normal in appearance for the degree of distention. Normal-appearing small bowel. Mobile cecum positioned in the RIGHT UPPER QUADRANT. Extensive distal descending and sigmoid colon diverticulosis without evidence of acute diverticulitis. Moderate rectal stool burden. Normal appendix in the RIGHT mid abdomen. 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 No evidence of aortic aneurysm. Upper normal sized retroperitoneal lymph nodes in the upper abdomen associated with edema/inflammation in the adjacent retroperitoneal fat. Reproductive: Prostate gland and seminal vesicles obscured by the metallic beam hardening artifact from the LEFT hip prosthesis. Other: Prior LEFT inguinal hernia repair without evidence of recurrence. Musculoskeletal: Severe degenerative disc disease at L2-3 and L4-5 and moderate degenerative disc disease at L5-S1. Facet degenerative changes diffusely throughout the lumbar spine. Prior LEFT hip arthroplasty with anatomic alignment. No acute findings. Mild thoracolumbar dextroscoliosis. IMPRESSION: 1. Upper normal sized retroperitoneal lymph nodes in the upper abdomen  associated with edema/inflammation in the  adjacent retroperitoneal fat. Query early retroperitoneal fibrosis. 2. Gallbladder wall thickening up to 7 mm may indicate cholecystitis, either acute or chronic. Cholelithiasis is suspected. 3. Diffuse cortical thinning involving both kidneys, RIGHT greater than LEFT. 4. Extensive distal descending and sigmoid colon diverticulosis without evidence of acute diverticulitis. 5. Dependent atelectasis in the lower lobes bilaterally, RIGHT greater than LEFT. BILATERAL lower lobe bronchiectasis. 6.  Aortic Atherosclerosis (ICD10-170.0) Electronically Signed   By: Evangeline Dakin M.D.   On: 08/08/2018 11:37   Dg Chest Port 1 View  Result Date: 08/08/2018 CLINICAL DATA:  Decreased oral intake, some vomiting EXAM: PORTABLE CHEST 1 VIEW COMPARISON:  Chest x-ray of 02/17/2016 FINDINGS: No active infiltrate or effusion is seen. The lungs may be slightly hyperaerated. Mediastinal and hilar contours are unremarkable and the heart is mildly enlarged. No acute bony abnormality is seen. IMPRESSION: No active cardiopulmonary disease.  Borderline cardiomegaly. Electronically Signed   By: Ivar Drape M.D.   On: 08/08/2018 08:58   US Abdomen Limited Ruq  Result Date: 08/08/2018 CLINICAL DATA:  Right upper quadrant pain.  Vomiting. EXAM: ULTRASOUND ABDOMEN LIMITED RIGHT UPPER QUADRANT COMPARISON:  CT 08/08/2018. FINDINGS: Gallbladder: Sludge and large gallstone noted. Gallbladder wall thickness 3.6 mm. Pericholecystic fluid. Cholecystitis could not be excluded. Negative Murphy sign. Common bile duct: Diameter: 5.0 mm Liver: Left lobe not well visualized. No focal lesion identified. Within normal limits in parenchymal echogenicity. Portal vein is patent on color Doppler imaging with normal direction of blood flow towards the liver. Incidental note is made of right pleural effusion. IMPRESSION: 1. Gallbladder sludge and large gallstone. Gallbladder wall is thickened. Pericholecystic fluid noted. Cholecystitis could not be  excluded. No biliary distention. 2.  Incidental note is made of right pleural effusion. Electronically Signed   By: Marcello Moores  Register   On: 08/08/2018 14:04    Anti-infectives: Anti-infectives (From admission, onward)   Start     Dose/Rate Route Frequency Ordered Stop   08/09/18 1200  cefTRIAXone (ROCEPHIN) 1 g in sodium chloride 0.9 % 100 mL IVPB     1 g 200 mL/hr over 30 Minutes Intravenous Every 24 hours 08/08/18 1717 08/16/18 1159   08/08/18 2300  metroNIDAZOLE (FLAGYL) IVPB 500 mg     500 mg 100 mL/hr over 60 Minutes Intravenous Every 8 hours 08/08/18 1717     08/08/18 1100  cefTRIAXone (ROCEPHIN) 2 g in sodium chloride 0.9 % 100 mL IVPB     2 g 200 mL/hr over 30 Minutes Intravenous  Once 08/08/18 1046 08/08/18 1317   08/08/18 1100  metroNIDAZOLE (FLAGYL) IVPB 500 mg     500 mg 100 mL/hr over 60 Minutes Intravenous  Once 08/08/18 1046 08/08/18 1641      Assessment/Plan: Chronic cholecystitis  Pain-free with normal examination this morning.  No family at bedside.  Patient seen with the hospitalist.  Recommend percutaneous drainage of his gallbladder after obtaining HIDA study today.Non operative candidate due to need for general anesthesia and insufflation which can cause significant cardiac instability / death  Risk of death  20 %  100 % risk of decline   50 % risk of delirium   Recommend drainage as best option    LOS: 1 day    Marcello Moores A Amaziah Raisanen 08/09/2018

## 2018-08-09 NOTE — Progress Notes (Signed)
Palliative Medicine RN Note: Spoke with [name] w [facility] for update.  Patient/family did NOT revoke hospice, and this is a GIP admission for [facility]. [name]'s hospice care team will continue to address d/c planning, GOC, and care plan, as they guide those discussions for their patients.  At this time, [facility] does not feel there is a role for PMT, but they will call us if that changes. PMT will not get involved at this time.  Marjie Skiff Zoua Caporaso, RN, BSN, [facility] Palliative Medicine Team 08/09/2018 10:41 AM Office [phone]

## 2018-08-09 NOTE — Procedures (Signed)
Interventional Radiology Procedure Note  Procedure: Percutaneous cholecystostomy tube placement  Complications: None  Estimated Blood Loss: None  Recommendations: - bile sent for culture - drain follow up in 4 weeks  Signed,  Criselda Peaches, MD

## 2018-08-09 NOTE — H&P (Signed)
Chief Complaint: Acute Cholecystitis  Referring Physician(s): Thurnell Lose  Supervising Physician: Jacqulynn Cadet  Patient Status: [facility] - In-pt  History of Present Illness: Daniel Schmitt is a 84 y.o. male with history of early dementia, basal cell carcinoma of the face, and STEMI with stent to mid RCA in 2017.  He had been receiving hospice care after a recent hip fracture.  Two days ago he started complaining of some heartburn.  Yesterday morning he was found throwing up and was shaking with fevers.  His family wishes to retract hospice and wanted him cared for so he was brought to the ER.  CT scan and right upper quadrant ultrasound was suspicious for cholecystitis.  Lab work showed elevated liver enzymes.  General surgery was consulted. They feel he is a poor surgical candidate due to his advanced age and co-morbidities and recommended we be consulted for a drain placement.    Currently ge denies any abdominal pain, nausea vomiting has improved, currently afebrile, denies any headache chest pain palpitations or shortness of breath.    Past Medical History:  Diagnosis Date  . Basal cell carcinoma (BCC) of face 10/11/2017  . Dementia ([place])   . Failure to thrive in adult   . NSTEMI (non-ST elevation myocardial infarction) ([place]) 01/2016   a.3.0 x 38 Synergy DES to mid RCA 01/2016  . Vertigo     Past Surgical History:  Procedure Laterality Date  . BIOPSY  02/06/2018   Procedure: BIOPSY;  Surgeon: Ladene Artist, MD;  Location: [place];  Service: Endoscopy;;  . CARDIAC CATHETERIZATION N/A 02/17/2016   Procedure: Left Heart Cath and Coronary Angiography;  Surgeon: Jettie Booze, MD;  Location: [place] CV [facility];  Service: Cardiovascular;  Laterality: N/A;  . CARDIAC CATHETERIZATION N/A 02/17/2016   Procedure: Coronary Stent Intervention;  Surgeon: Jettie Booze, MD;  Location: [place] [facility];  Service: Cardiovascular;  Laterality: N/A;    . ESOPHAGOGASTRODUODENOSCOPY N/A 02/06/2018   Procedure: ESOPHAGOGASTRODUODENOSCOPY (EGD);  Surgeon: Ladene Artist, MD;  [facility] ENDOSCOPY;  Service: Endoscopy;  Laterality: N/A;  . HERNIA REPAIR    . TOTAL HIP ARTHROPLASTY Left 01/26/2018   Procedure: LEFT TOTAL HIP ARTHROPLASTY ANTERIOR APPROACH;  Surgeon: Rod Can, MD;  Location: [facility];  Service: Orthopedics;  Laterality: Left;  Needs RNFA    Allergies: Aspirin  Medications: Prior to Admission medications   Medication Sig Start Date End Date Taking? Authorizing Provider  pantoprazole (PROTONIX) 40 MG tablet TAKE 1 TABLET BY MOUTH EVERY DAY Patient taking differently: Take 40 mg by mouth daily.  03/15/18  Yes Josue Hector, MD  QUEtiapine (SEROQUEL) 25 MG tablet Take 75 mg by mouth daily.   Yes [provider]  temazepam (RESTORIL) 15 MG capsule Take 15 mg by mouth at bedtime as needed for sleep.   Yes [provider]  amLODipine (NORVASC) 10 MG tablet Take 1 tablet (10 mg total) by mouth daily. Patient not taking: Reported on 08/08/2018 07/05/16   Imogene Burn, PA-C  atorvastatin (LIPITOR) 80 MG tablet TAKE 1 TABLET BY MOUTH EVERY DAY AT 6PM Patient not taking: Reported on 08/08/2018 02/01/18   Josue Hector, MD  carvedilol (COREG) 12.5 MG tablet TAKE 1 TABLET (12.5 MG TOTAL) BY MOUTH 2 (TWO) TIMES DAILY WITH A MEAL. Patient not taking: Reported on 08/08/2018 10/11/17   Josue Hector, MD  nitroGLYCERIN (NITROSTAT) 0.4 MG SL tablet Place 1 tablet (0.4 mg total) under the  tongue every 5 (five) minutes x 3 doses as needed for chest pain. Patient not taking: Reported on 08/08/2018 11/07/17   Josue Hector, MD     History reviewed. No pertinent family history.  Social History   Socioeconomic History  . Marital status: Married    Spouse name: Not on file  . Number of children: Not on file  . Years of education: Not on file  . Highest education level: Not on file  Occupational History  . Not  on file  Social Needs  . Financial resource strain: Not on file  . Food insecurity:    Worry: Not on file    Inability: Not on file  . Transportation needs:    Medical: Not on file    Non-medical: Not on file  Tobacco Use  . Smoking status: Former Research scientist (life sciences)  . Smokeless tobacco: Never Used  . Tobacco comment: QUIT SMOKING IN THE LATE 60'S  Substance and Sexual Activity  . Alcohol use: No  . Drug use: No  . Sexual activity: Not on file  Lifestyle  . Physical activity:    Days per week: Not on file    Minutes per session: Not on file  . Stress: Not on file  Relationships  . Social connections:    Talks on phone: Not on file    Gets together: Not on file    Attends religious service: Not on file    Active member of club or organization: Not on file    Attends meetings of clubs or organizations: Not on file    Relationship status: Not on file  Other Topics Concern  . Not on file  Social History Narrative  . Not on file     Review of Systems: A 12 point ROS discussed and pertinent positives are indicated in the HPI above.  All other systems are negative.  Review of Systems  Vital Signs: BP (!) 101/55 (BP Location: Left Arm)   Pulse 63   Temp 98 F (36.7 C)   Resp 19   SpO2 92%   Physical Exam Vitals signs reviewed.  Constitutional:      Appearance: Normal appearance.  HENT:     Head: Normocephalic and atraumatic.  Eyes:     Extraocular Movements: Extraocular movements intact.  Neck:     Musculoskeletal: Normal range of motion.  Cardiovascular:     Rate and Rhythm: Normal rate and regular rhythm.  Pulmonary:     Effort: Pulmonary effort is normal.     Breath sounds: Normal breath sounds.  Abdominal:     General: There is no distension.     Tenderness: There is no abdominal tenderness.  Musculoskeletal: Normal range of motion.  Skin:    General: Skin is warm and dry.  Neurological:     General: No focal deficit present.     Mental Status: He is alert and  oriented to person, place, and time.     Comments: He new he was at the hospital, he could tell me how may quarters are in a dollar, he could tell me the season, but he could not tell me the president.   Psychiatric:        Mood and Affect: Mood normal.        Behavior: Behavior normal.        Thought Content: Thought content normal.        Judgment: Judgment normal.     Imaging: Ct Abdomen Pelvis Wo Contrast  Result  Date: 08/08/2018 CLINICAL DATA:  84 year old with decreased oral intake for the past several weeks according to the patient's family, presenting now with acute onset of vomiting. EXAM: CT ABDOMEN AND PELVIS WITHOUT CONTRAST TECHNIQUE: Multidetector CT imaging of the abdomen and pelvis was performed following the standard protocol without IV contrast. COMPARISON:  None. FINDINGS: Beam hardening streak artifact over the upper abdomen as the patient is unable to raise the arms. Metallic beam hardening streak artifact related to the patient's LEFT hip prosthesis. Lower chest: Dependent atelectasis in the lower lobes bilaterally, RIGHT greater than LEFT. BILATERAL lower lobe bronchiectasis. Heart mildly enlarged. RIGHT coronary artery stent. Hepatobiliary: Normal unenhanced appearance of the liver. Very small gallstones are suspected in the dependent portion of the gallbladder. Gallbladder wall thickening up to approximately 7 mm. Pancreas: Normal unenhanced appearance. Spleen: Normal unenhanced appearance. Adrenals/Urinary Tract: Normal appearing adrenal glands. Diffuse cortical thinning involving both kidneys, RIGHT greater than LEFT. Allowing for the unenhanced technique, no focal parenchymal abnormality involving either kidney. No hydronephrosis. No urinary tract calculi. Normal appearing urinary bladder. Stomach/Bowel: Stomach normal in appearance for the degree of distention. Normal-appearing small bowel. Mobile cecum positioned in the RIGHT UPPER QUADRANT. Extensive distal descending and  sigmoid colon diverticulosis without evidence of acute diverticulitis. Moderate rectal stool burden. Normal appendix in the RIGHT mid abdomen. 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 No evidence of aortic aneurysm. Upper normal sized retroperitoneal lymph nodes in the upper abdomen associated with edema/inflammation in the adjacent retroperitoneal fat. Reproductive: Prostate gland and seminal vesicles obscured by the metallic beam hardening artifact from the LEFT hip prosthesis. Other: Prior LEFT inguinal hernia repair without evidence of recurrence. Musculoskeletal: Severe degenerative disc disease at L2-3 and L4-5 and moderate degenerative disc disease at L5-S1. Facet degenerative changes diffusely throughout the lumbar spine. Prior LEFT hip arthroplasty with anatomic alignment. No acute findings. Mild thoracolumbar dextroscoliosis. IMPRESSION: 1. Upper normal sized retroperitoneal lymph nodes in the upper abdomen associated with edema/inflammation in the adjacent retroperitoneal fat. Query early retroperitoneal fibrosis. 2. Gallbladder wall thickening up to 7 mm may indicate cholecystitis, either acute or chronic. Cholelithiasis is suspected. 3. Diffuse cortical thinning involving both kidneys, RIGHT greater than LEFT. 4. Extensive distal descending and sigmoid colon diverticulosis without evidence of acute diverticulitis. 5. Dependent atelectasis in the lower lobes bilaterally, RIGHT greater than LEFT. BILATERAL lower lobe bronchiectasis. 6.  Aortic Atherosclerosis (ICD10-170.0) Electronically Signed   By: Evangeline Dakin M.D.   On: 08/08/2018 11:37   Dg Chest Port 1 View  Result Date: 08/08/2018 CLINICAL DATA:  Decreased oral intake, some vomiting EXAM: PORTABLE CHEST 1 VIEW COMPARISON:  Chest x-ray of 02/17/2016 FINDINGS: No active infiltrate or effusion is seen. The lungs may be slightly hyperaerated. Mediastinal and hilar contours are unremarkable and the heart is  mildly enlarged. No acute bony abnormality is seen. IMPRESSION: No active cardiopulmonary disease.  Borderline cardiomegaly. Electronically Signed   By: Ivar Drape M.D.   On: 08/08/2018 08:58   US Abdomen Limited Ruq  Result Date: 08/08/2018 CLINICAL DATA:  Right upper quadrant pain.  Vomiting. EXAM: ULTRASOUND ABDOMEN LIMITED RIGHT UPPER QUADRANT COMPARISON:  CT 08/08/2018. FINDINGS: Gallbladder: Sludge and large gallstone noted. Gallbladder wall thickness 3.6 mm. Pericholecystic fluid. Cholecystitis could not be excluded. Negative Murphy sign. Common bile duct: Diameter: 5.0 mm Liver: Left lobe not well visualized. No focal lesion identified. Within normal limits in parenchymal echogenicity. Portal vein is patent on color Doppler imaging with normal direction of blood flow towards the liver. Incidental note  is made of right pleural effusion. IMPRESSION: 1. Gallbladder sludge and large gallstone. Gallbladder wall is thickened. Pericholecystic fluid noted. Cholecystitis could not be excluded. No biliary distention. 2.  Incidental note is made of right pleural effusion. Electronically Signed   By: Marcello Moores  Register   On: 08/08/2018 14:04    Labs:  CBC: Recent Labs    02/07/18 0506 08/08/18 0916 08/08/18 1940 08/09/18 0009  WBC 19.2* 12.7* 15.1* 14.8*  HGB 8.5* 11.0* 8.8* 9.7*  HCT 26.6* 35.2* 27.2* 30.1*  PLT 277 165 129* 125*    COAGS: Recent Labs    01/24/18 1535 08/08/18 1940 08/09/18 0728  INR 1.03 1.72 1.36    BMP: Recent Labs    02/05/18 1140 02/05/18 1217 02/07/18 0506 08/08/18 0916 08/08/18 1940 08/09/18 0009  NA 146* 148* 147* 141  --  140  K 4.2 4.3 4.0 4.1  --  4.4  CL 117* 117* 120* 107  --  110  CO2 19*  --  21* 22  --  23  GLUCOSE 168* 161* 111* 117*  --  95  BUN 72* 70* 61* 19  --  20  CALCIUM 8.2*  --  8.2* 9.0  --  8.1*  CREATININE 1.82* 1.70* 1.66* 2.20* 1.71* 2.19*  GFRNONAA 31*  --  35* 25* 34* 26*  GFRAA 36*  --  41* 29* 40* 30*    LIVER  FUNCTION TESTS: Recent Labs    10/11/17 1344 02/05/18 1140 08/08/18 0916 08/09/18 0009  BILITOT 0.9 0.5 1.7* 2.2*  AST 29 42* 379* 267*  ALT 15 18 112* 130*  ALKPHOS 84 54 194* 181*  PROT 7.8 5.3* 5.8* 4.6*  ALBUMIN 3.8 2.6* 3.0* 2.2*    TUMOR MARKERS: No results for input(s): AFPTM, CEA, CA199, CHROMGRNA in the last 8760 hours.  Assessment and Plan:  Acute cholecystitis in a patient with advanced age and multiple co- morbidities.   Will proceed with placement of percutaneous cholecystostomy today by Dr. Laurence Ferrari.  Risks and benefits discussed with the patient and his daughter including, but not limited to bleeding, infection, gallbladder perforation, bile leak, sepsis or even death.  All of the their questions were answered, patient is agreeable to proceed. Consent signed and in chart.  Thank you for this interesting consult.  I greatly enjoyed meeting Daniel Schmitt and look forward to participating in their care.  A copy of this report was sent to the requesting provider on this date.  Electronically Signed: Murrell Redden, PA-C   08/09/2018, 9:29 AM      I spent a total of 40 Minutes in face to face in clinical consultation, greater than 50% of which was counseling/coordinating care for perc chole.

## 2018-08-10 LAB — COMPREHENSIVE METABOLIC PANEL
ALT: 78 U/L — ABNORMAL HIGH (ref 0–44)
AST: 122 U/L — ABNORMAL HIGH (ref 15–41)
Albumin: 2.2 g/dL — ABNORMAL LOW (ref 3.5–5.0)
Alkaline Phosphatase: 182 U/L — ABNORMAL HIGH (ref 38–126)
Anion gap: 7 (ref 5–15)
BUN: 23 mg/dL (ref 8–23)
CO2: 23 mmol/L (ref 22–32)
Calcium: 8.1 mg/dL — ABNORMAL LOW (ref 8.9–10.3)
Chloride: 110 mmol/L (ref 98–111)
Creatinine, Ser: 2.07 mg/dL — ABNORMAL HIGH (ref 0.61–1.24)
GFR calc non Af Amer: 27 mL/min — ABNORMAL LOW (ref >60)
GFR, EST AFRICAN AMERICAN: 32 mL/min — AB (ref >60)
Glucose, Bld: 121 mg/dL — ABNORMAL HIGH (ref 70–99)
Potassium: 4.2 mmol/L (ref 3.5–5.1)
Sodium: 140 mmol/L (ref 135–145)
Total Bilirubin: 2.7 mg/dL — ABNORMAL HIGH (ref 0.3–1.2)
Total Protein: 4.6 g/dL — ABNORMAL LOW (ref 6.5–8.1)

## 2018-08-10 LAB — CBC
HCT: 31.4 % — ABNORMAL LOW (ref 39.0–52.0)
Hemoglobin: 10 g/dL — ABNORMAL LOW (ref 13.0–17.0)
MCH: 31.3 pg (ref 26.0–34.0)
MCHC: 31.8 g/dL (ref 30.0–36.0)
MCV: 98.1 fL (ref 80.0–100.0)
PLATELETS: 139 10*3/uL — AB (ref 150–400)
RBC: 3.2 MIL/uL — ABNORMAL LOW (ref 4.22–5.81)
RDW: 14 % (ref 11.5–15.5)
WBC: 10.1 10*3/uL (ref 4.0–10.5)
nRBC: 0 % (ref 0.0–0.2)

## 2018-08-10 MED ORDER — ENSURE ENLIVE PO LIQD
237.0000 mL | Freq: Two times a day (BID) | ORAL | Status: DC
  Administered 2018-08-10 – 2018-08-11 (×3): 237 mL via ORAL

## 2018-08-10 NOTE — Progress Notes (Signed)
[facility] Progress Note     Subjective: CC-  Comfortable in bed. States that he has a mild soreness in his RUQ with deep inspiration. Ok at rest. Denies n/v. States that he's a picky eater and typically does not eat very much. He had orange juice for breakfast and tolerated this well. WBC 10.1, afebrile  Objective: Vital signs in last 24 hours: Temp:  [97.7 F (36.5 C)-98.7 F (37.1 C)] 98.3 F (36.8 C) (12/19 0512) Pulse Rate:  [55-72] 66 (12/19 0512) Resp:  [13-18] 18 (12/19 0512) BP: (116-146)/(54-72) 116/61 (12/19 0512) SpO2:  [95 %-100 %] 95 % (12/19 0512) Last BM Date: (PTA)  Intake/Output from previous day: 12/18 0701 - 12/19 0700 In: 0  Out: 550 [Urine:550] Intake/Output this shift: No intake/output data recorded.  PE: Gen:  Alert, NAD, pleasant Pulm:  effort normal Abd: Soft, ND, +BS, no HSM, mild subjective TTP RUQ, drain with cdi dressing/bilious output in bag  Lab Results:  Recent Labs    08/09/18 0009 08/10/18 0324  WBC 14.8* 10.1  HGB 9.7* 10.0*  HCT 30.1* 31.4*  PLT 125* 139*   BMET Recent Labs    08/09/18 0009 08/10/18 0324  NA 140 140  K 4.4 4.2  CL 110 110  CO2 23 23  GLUCOSE 95 121*  BUN 20 23  CREATININE 2.19* 2.07*  CALCIUM 8.1* 8.1*   PT/INR Recent Labs    08/08/18 1940 08/09/18 0728  LABPROT 19.9* 16.6*  INR 1.72 1.36   CMP     Component Value Date/Time   NA 140 08/10/2018 0324   K 4.2 08/10/2018 0324   CL 110 08/10/2018 0324   CO2 23 08/10/2018 0324   GLUCOSE 121 (H) 08/10/2018 0324   BUN 23 08/10/2018 0324   CREATININE 2.07 (H) 08/10/2018 0324   CREATININE 1.90 (H) 10/11/2017 1344   CREATININE 1.48 (H) 04/09/2016 1200   CALCIUM 8.1 (L) 08/10/2018 0324   PROT 4.6 (L) 08/10/2018 0324   ALBUMIN 2.2 (L) 08/10/2018 0324   AST 122 (H) 08/10/2018 0324   AST 29 10/11/2017 1344   ALT 78 (H) 08/10/2018 0324   ALT 15 10/11/2017 1344   ALKPHOS 182 (H) 08/10/2018 0324   BILITOT 2.7 (H) 08/10/2018 0324   BILITOT 0.9 10/11/2017 1344   GFRNONAA 27 (L) 08/10/2018 0324   GFRAA 32 (L) 08/10/2018 0324   Lipase     Component Value Date/Time   LIPASE 285 (H) 08/08/2018 0916       Studies/Results: Nm Hepatobiliary Liver Func  Result Date: 08/09/2018 CLINICAL DATA:  Mildly elevated liver enzymes and leukocytosis. No RIGHT upper quadrant pain. Ultrasound concern for cholecystitis. EXAM: NUCLEAR MEDICINE HEPATOBILIARY IMAGING TECHNIQUE: Sequential images of the abdomen were obtained out to 60 minutes following intravenous administration of radiopharmaceutical. RADIOPHARMACEUTICALS:  5.2 mCi Tc-21m  Choletec IV COMPARISON:  Ultrasound 08/08/2018 FINDINGS: Prompt clearance of radiotracer from the blood pool and homogeneous uptake in the liver. Imaging performed for 60 minutes. The common hepatic duct and common bile duct are not identified indication for secretion of radiotracer. Gallbladder could not be assessed of these conditions. IMPRESSION: 1. No excretion of radiotracer with prompt uptake in liver. Differential include hepatitis, cholestasis, or biliary ductal obstruction. Bile duct obstruction is not favored as the extrahepatic ducts are not identified. 2. Gallbladder / cystic duct patency can not be evaluated with no excretion into the external bile ducts. Electronically Signed   By: Suzy Bouchard M.D.   On: 08/09/2018 15:46  Ir Perc Cholecystostomy  Result Date: 08/09/2018 INDICATION: 84 year old male with acute calculus cholecystitis. He has a poor operative candidate and presents for placement of a percutaneous cholecystostomy tube. EXAM: CHOLECYSTOSTOMY MEDICATIONS: 2 g Ancef administered intravenously. ANESTHESIA/SEDATION: Moderate (conscious) sedation was employed during this procedure. A total of Versed 1 mg and Fentanyl 50 mcg was administered intravenously. Moderate Sedation Time: 13 minutes. The patient's level of consciousness and vital signs were monitored continuously by radiology  nursing throughout the procedure under my direct supervision. FLUOROSCOPY TIME:  Fluoroscopy Time: 0 minutes 54 seconds (1.2 mGy). COMPLICATIONS: None immediate. PROCEDURE: Informed written consent was obtained from the patient after a thorough discussion of the procedural risks, benefits and alternatives. All questions were addressed. Maximal Sterile Barrier Technique was utilized including caps, mask, sterile gowns, sterile gloves, sterile drape, hand hygiene and skin antiseptic. A timeout was performed prior to the initiation of the procedure. The right upper quadrant was interrogated with ultrasound. The gallbladder is dilated and diffusely thick walled. A suitable skin entry site was selected. Local anesthesia was attained by infiltration with 1% lidocaine. A small dermatotomy was made. Under real-time sonographic guidance, a 21 gauge Chiba needle was advanced along a short transhepatic course and into the gallbladder lumen. There was return of purulent bile. A wire was advanced of the gallbladder lumen. The Chiba needle was exchanged for a transitional 4 [place] dilator. A gentle hand injection of contrast material was performed opacifying the gallbladder lumen. A 0.035 Amplatz wire was then advanced in the gallbladder lumen. The transitional sheath was removed. The skin and transhepatic tract were dilated to 10 [place]. A Cook 10 [place] all-purpose drainage catheter was advanced over the wire and formed in the gallbladder. Aspiration of purulent bile was performed and sent for Gram stain and culture. The catheter was then flushed and connected to gravity bag drainage. Images were obtained and stored for the medical record. The catheter was secured to the skin with 0 Prolene suture. IMPRESSION: Successful placement of a 10 French percutaneous transhepatic cholecystostomy tube for treatment of acute calculus cholecystitis in a poor operative candidate. Electronically Signed   By: Jacqulynn Cadet M.D.   On:  08/09/2018 16:55   US Abdomen Limited Ruq  Result Date: 08/08/2018 CLINICAL DATA:  Right upper quadrant pain.  Vomiting. EXAM: ULTRASOUND ABDOMEN LIMITED RIGHT UPPER QUADRANT COMPARISON:  CT 08/08/2018. FINDINGS: Gallbladder: Sludge and large gallstone noted. Gallbladder wall thickness 3.6 mm. Pericholecystic fluid. Cholecystitis could not be excluded. Negative Murphy sign. Common bile duct: Diameter: 5.0 mm Liver: Left lobe not well visualized. No focal lesion identified. Within normal limits in parenchymal echogenicity. Portal vein is patent on color Doppler imaging with normal direction of blood flow towards the liver. Incidental note is made of right pleural effusion. IMPRESSION: 1. Gallbladder sludge and large gallstone. Gallbladder wall is thickened. Pericholecystic fluid noted. Cholecystitis could not be excluded. No biliary distention. 2.  Incidental note is made of right pleural effusion. Electronically Signed   By: Marcello Moores  Register   On: 08/08/2018 14:04    Anti-infectives: Anti-infectives (From admission, onward)   Start     Dose/Rate Route Frequency Ordered Stop   08/09/18 1557  ceFAZolin (ANCEF) 2-4 GM/100ML-% IVPB    Note to Pharmacy:  Roma Kayser  : cabinet override      08/09/18 1557 08/09/18 1630   08/09/18 1200  cefTRIAXone (ROCEPHIN) 1 g in sodium chloride 0.9 % 100 mL IVPB     1 g 200 mL/hr over 30 Minutes Intravenous  Every 24 hours 08/08/18 1717 08/16/18 1159   08/08/18 2300  metroNIDAZOLE (FLAGYL) IVPB 500 mg     500 mg 100 mL/hr over 60 Minutes Intravenous Every 8 hours 08/08/18 1717     08/08/18 1100  cefTRIAXone (ROCEPHIN) 2 g in sodium chloride 0.9 % 100 mL IVPB     2 g 200 mL/hr over 30 Minutes Intravenous  Once 08/08/18 1046 08/08/18 1317   08/08/18 1100  metroNIDAZOLE (FLAGYL) IVPB 500 mg     500 mg 100 mL/hr over 60 Minutes Intravenous  Once 08/08/18 1046 08/08/18 1641       Assessment/Plan Chronic cholecystitis  S/p percutaneous cholecystostomy  tube placement 12/18 - culture pending - Regular diet as tolerated, add ensure. Continue drain. Continue abx and follow culture.  ID - rocephin/flagyl 12/17>> FEN - reg diet, Ensure VTE - SCDs, sq heparin Foley - none   LOS: 2 days    Wellington Hampshire , [facility] 08/10/2018, 11:29 AM Pager: [phone] Mon 7:00 am -11:30 AM Tues-Fri 7:00 am-4:30 pm Sat-Sun 7:00 am-11:30 am

## 2018-08-10 NOTE — Progress Notes (Addendum)
PROGRESS NOTE        PATIENT DETAILS Name: [place] Schmitt Age: 84 y.o. Sex: male Date of Birth: 1926-07-14 Admit Date: 08/08/2018 Admitting Physician Thurnell Lose, MD PCP:Kim, Jeneen Rinks, MD  Brief Narrative: Patient is a 84 y.o. male with history of dementia, basal cell carcinoma of the face, CAD with PCI to RCA 2017, recent hip fracture requiring left hip replacement-being followed by hospice at home-presented with vomiting, fever, elevated LFTs and leukocytosis-thought to have cholecystitis and admitted to the hospitalist service.  Subjective: No RUQ pain.  No nausea vomiting.  Tolerating advancement in diet well.  Mildly confused.  Assessment/Plan: Sepsis secondary to acute calculus cholecystitis: Sepsis pathophysiology has resolved, underwent percutaneous cholecystostomy tube placement on 12/18.  If clinical improvement continues-suspect we could transition to oral antimicrobial therapy and discharge home on 12/20.  AKI on CKD stage IV: AKI likely hemodynamically mediated, downtrending with supportive care.  Follow electrolytes periodically.  CAD status post PCI to mid RCA 2017: Stable without any anginal symptoms.  Hypertension: Blood pressure soft-continue to hold all antihypertensives  GERD: Continue PPI  Dementia: Pleasantly confused-remains at risk for delirium.  Advanced directive/palliative care: DNR in place-followed at home with hospice care.  Very frail-elderly-best served by gentle medical treatment rather than aggressive care.  DVT Prophylaxis: Prophylactic Heparin   Code Status: Full code  Family Communication: None at bedside-attempted to call patient's daughter-number listed in facesheet appears to be a wrong number.  Disposition Plan: Remain inpatient-hopefully back home with hospice care on 12/20  Antimicrobial agents: Anti-infectives (From admission, onward)   Start     Dose/Rate Route Frequency Ordered Stop   08/09/18  1557  ceFAZolin (ANCEF) 2-4 GM/100ML-% IVPB    Note to Pharmacy:  Roma Kayser  : cabinet override      08/09/18 1557 08/09/18 1630   08/09/18 1200  cefTRIAXone (ROCEPHIN) 1 g in sodium chloride 0.9 % 100 mL IVPB     1 g 200 mL/hr over 30 Minutes Intravenous Every 24 hours 08/08/18 1717 08/16/18 1159   08/08/18 2300  metroNIDAZOLE (FLAGYL) IVPB 500 mg     500 mg 100 mL/hr over 60 Minutes Intravenous Every 8 hours 08/08/18 1717     08/08/18 1100  cefTRIAXone (ROCEPHIN) 2 g in sodium chloride 0.9 % 100 mL IVPB     2 g 200 mL/hr over 30 Minutes Intravenous  Once 08/08/18 1046 08/08/18 1317   08/08/18 1100  metroNIDAZOLE (FLAGYL) IVPB 500 mg     500 mg 100 mL/hr over 60 Minutes Intravenous  Once 08/08/18 1046 08/08/18 1641      Procedures: None  CONSULTS:  general surgery  Time spent: 25- minutes-Greater than 50% of this time was spent in counseling, explanation of diagnosis, planning of further management, and coordination of care.  MEDICATIONS: Scheduled Meds: . feeding supplement (ENSURE ENLIVE)  237 mL Oral BID BM  . heparin  5,000 Units Subcutaneous Q8H  . ondansetron (ZOFRAN) IV  4 mg Intravenous Once  . pantoprazole (PROTONIX) IV  40 mg Intravenous Q12H  . sodium chloride flush  5 mL Intracatheter Q8H   Continuous Infusions: . cefTRIAXone (ROCEPHIN)  IV 1 g (08/10/18 1412)  . metronidazole 500 mg (08/10/18 0542)   PRN Meds:.bisacodyl, hydrALAZINE, morphine injection, nitroGLYCERIN, ondansetron (ZOFRAN) IV   PHYSICAL EXAM: Vital signs: Vitals:   08/09/18 1735 08/09/18 1846 08/09/18  2054 08/10/18 0512  BP: (!) 133/56 (!) 129/57 125/66 116/61  Pulse: (!) 55 65 72 66  Resp:  16 18 18   Temp: 97.7 F (36.5 C) 98.2 F (36.8 C) 98.2 F (36.8 C) 98.3 F (36.8 C)  TempSrc: Oral Oral Oral Oral  SpO2: 99% 97% 95% 95%   There were no vitals filed for this visit. There is no height or weight on file to calculate BMI.   General appearance:Awake, pleasantly  confused Eyes:no scleral icterus. HEENT: Atraumatic and Normocephalic Neck: supple, no JVD. Resp:Good air entry bilaterally,no rales or rhonchi CVS: S1 S2 regular GI: Bowel sounds present, Non tender and not distended with no gaurding, rigidity or rebound. Extremities: B/L Lower Ext shows no edema, both legs are warm to touch Neurology: Was all 4 extremities.   Musculoskeletal:No digital cyanosis Skin:No Rash, warm and dry Wounds:N/A  I have personally reviewed following labs and imaging studies  LABORATORY DATA: CBC: Recent Labs  Lab 08/08/18 0916 08/08/18 1940 08/09/18 0009 08/10/18 0324  WBC 12.7* 15.1* 14.8* 10.1  HGB 11.0* 8.8* 9.7* 10.0*  HCT 35.2* 27.2* 30.1* 31.4*  MCV 100.6* 100.4* 99.0 98.1  PLT 165 129* 125* 139*    Basic Metabolic Panel: Recent Labs  Lab 08/08/18 0916 08/08/18 1940 08/09/18 0009 08/10/18 0324  NA 141  --  140 140  K 4.1  --  4.4 4.2  CL 107  --  110 110  CO2 22  --  23 23  GLUCOSE 117*  --  95 121*  BUN 19  --  20 23  CREATININE 2.20* 1.71* 2.19* 2.07*  CALCIUM 9.0  --  8.1* 8.1*    GFR: CrCl cannot be calculated (Unknown ideal weight.).  Liver Function Tests: Recent Labs  Lab 08/08/18 0916 08/09/18 0009 08/10/18 0324  AST 379* 267* 122*  ALT 112* 130* 78*  ALKPHOS 194* 181* 182*  BILITOT 1.7* 2.2* 2.7*  PROT 5.8* 4.6* 4.6*  ALBUMIN 3.0* 2.2* 2.2*   Recent Labs  Lab 08/08/18 0916  LIPASE 285*   No results for input(s): AMMONIA in the last 168 hours.  Coagulation Profile: Recent Labs  Lab 08/08/18 1940 08/09/18 0728  INR 1.72 1.36    Cardiac Enzymes: No results for input(s): CKTOTAL, CKMB, CKMBINDEX, TROPONINI in the last 168 hours.  BNP (last 3 results) No results for input(s): PROBNP in the last 8760 hours.  HbA1C: No results for input(s): HGBA1C in the last 72 hours.  CBG: No results for input(s): GLUCAP in the last 168 hours.  Lipid Profile: No results for input(s): CHOL, HDL, LDLCALC, TRIG,  CHOLHDL, LDLDIRECT in the last 72 hours.  Thyroid Function Tests: No results for input(s): TSH, T4TOTAL, FREET4, T3FREE, THYROIDAB in the last 72 hours.  Anemia Panel: No results for input(s): VITAMINB12, FOLATE, FERRITIN, TIBC, IRON, RETICCTPCT in the last 72 hours.  Urine analysis:    Component Value Date/Time   COLORURINE YELLOW 02/16/2016 2058   APPEARANCEUR CLEAR 02/16/2016 2058   LABSPEC 1.025 02/16/2016 2058   PHURINE 5.0 02/16/2016 2058   GLUCOSEU NEGATIVE 02/16/2016 2058   HGBUR NEGATIVE 02/16/2016 2058   BILIRUBINUR NEGATIVE 02/16/2016 2058   [place] NEGATIVE 02/16/2016 2058   PROTEINUR NEGATIVE 02/16/2016 2058   UROBILINOGEN 0.2 02/11/2013 1353   NITRITE NEGATIVE 02/16/2016 2058   LEUKOCYTESUR NEGATIVE 02/16/2016 2058    Sepsis Labs: Lactic Acid, Venous    Component Value Date/Time   LATICACIDVEN 1.5 08/09/2018 0009    MICROBIOLOGY: Recent Results (from the past 240  hour(s))  Culture, blood (single)     Status: None (Preliminary result)   Collection Time: 08/08/18  9:16 AM  Result Value Ref Range Status   Specimen Description BLOOD RIGHT ANTECUBITAL  Final   Special Requests   Final    BOTTLES DRAWN AEROBIC AND ANAEROBIC Blood Culture adequate volume   Culture   Final    NO GROWTH 2 DAYS Performed at [facility], [address], [place], [place] [address]    Report Status PENDING  Incomplete  Culture, blood (Routine X 2) w Reflex to ID Panel     Status: None (Preliminary result)   Collection Time: 08/08/18  9:25 AM  Result Value Ref Range Status   Specimen Description BLOOD RIGHT FOREARM  Final   Special Requests   Final    BOTTLES DRAWN AEROBIC AND ANAEROBIC Blood Culture results may not be optimal due to an inadequate volume of blood received in culture bottles   Culture   Final    NO GROWTH 2 DAYS Performed at [facility], [place] [address], [place], [place] [address]    Report Status PENDING  Incomplete  Body fluid culture      Status: None (Preliminary result)   Collection Time: 08/09/18  7:05 PM  Result Value Ref Range Status   Specimen Description BILE  Final   Special Requests Normal  Final   Gram Stain   Final    RARE WBC PRESENT, PREDOMINANTLY PMN MODERATE GRAM POSITIVE COCCI    Culture   Final    CULTURE REINCUBATED FOR BETTER GROWTH Performed at [facility], [place] [address], [place], [place] [address]    Report Status PENDING  Incomplete    RADIOLOGY STUDIES/RESULTS: Ct Abdomen Pelvis Wo Contrast  Result Date: 08/08/2018 CLINICAL DATA:  84 year old with decreased oral intake for the past several weeks according to the patient's family, presenting now with acute onset of vomiting. EXAM: CT ABDOMEN AND PELVIS WITHOUT CONTRAST TECHNIQUE: Multidetector CT imaging of the abdomen and pelvis was performed following the standard protocol without IV contrast. COMPARISON:  None. FINDINGS: Beam hardening streak artifact over the upper abdomen as the patient is unable to raise the arms. Metallic beam hardening streak artifact related to the patient's LEFT hip prosthesis. Lower chest: Dependent atelectasis in the lower lobes bilaterally, RIGHT greater than LEFT. BILATERAL lower lobe bronchiectasis. Heart mildly enlarged. RIGHT coronary artery stent. Hepatobiliary: Normal unenhanced appearance of the liver. Very small gallstones are suspected in the dependent portion of the gallbladder. Gallbladder wall thickening up to approximately 7 mm. Pancreas: Normal unenhanced appearance. Spleen: Normal unenhanced appearance. Adrenals/Urinary Tract: Normal appearing adrenal glands. Diffuse cortical thinning involving both kidneys, RIGHT greater than LEFT. Allowing for the unenhanced technique, no focal parenchymal abnormality involving either kidney. No hydronephrosis. No urinary tract calculi. Normal appearing urinary bladder. Stomach/Bowel: Stomach normal in appearance for the degree of distention. Normal-appearing small bowel.  Mobile cecum positioned in the RIGHT UPPER QUADRANT. Extensive distal descending and sigmoid colon diverticulosis without evidence of acute diverticulitis. Moderate rectal stool burden. Normal appendix in the RIGHT mid abdomen. 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 No evidence of aortic aneurysm. Upper normal sized retroperitoneal lymph nodes in the upper abdomen associated with edema/inflammation in the adjacent retroperitoneal fat. Reproductive: Prostate gland and seminal vesicles obscured by the metallic beam hardening artifact from the LEFT hip prosthesis. Other: Prior LEFT inguinal hernia repair without evidence of recurrence. Musculoskeletal: Severe degenerative disc disease at L2-3 and L4-5 and  moderate degenerative disc disease at L5-S1. Facet degenerative changes diffusely throughout the lumbar spine. Prior LEFT hip arthroplasty with anatomic alignment. No acute findings. Mild thoracolumbar dextroscoliosis. IMPRESSION: 1. Upper normal sized retroperitoneal lymph nodes in the upper abdomen associated with edema/inflammation in the adjacent retroperitoneal fat. Query early retroperitoneal fibrosis. 2. Gallbladder wall thickening up to 7 mm may indicate cholecystitis, either acute or chronic. Cholelithiasis is suspected. 3. Diffuse cortical thinning involving both kidneys, RIGHT greater than LEFT. 4. Extensive distal descending and sigmoid colon diverticulosis without evidence of acute diverticulitis. 5. Dependent atelectasis in the lower lobes bilaterally, RIGHT greater than LEFT. BILATERAL lower lobe bronchiectasis. 6.  Aortic Atherosclerosis (ICD10-170.0) Electronically Signed   By: Evangeline Dakin M.D.   On: 08/08/2018 11:37   Nm Hepatobiliary Liver Func  Result Date: 08/09/2018 CLINICAL DATA:  Mildly elevated liver enzymes and leukocytosis. No RIGHT upper quadrant pain. Ultrasound concern for cholecystitis. EXAM: NUCLEAR MEDICINE HEPATOBILIARY IMAGING TECHNIQUE:  Sequential images of the abdomen were obtained out to 60 minutes following intravenous administration of radiopharmaceutical. RADIOPHARMACEUTICALS:  5.2 mCi Tc-25m  Choletec IV COMPARISON:  Ultrasound 08/08/2018 FINDINGS: Prompt clearance of radiotracer from the blood pool and homogeneous uptake in the liver. Imaging performed for 60 minutes. The common hepatic duct and common bile duct are not identified indication for secretion of radiotracer. Gallbladder could not be assessed of these conditions. IMPRESSION: 1. No excretion of radiotracer with prompt uptake in liver. Differential include hepatitis, cholestasis, or biliary ductal obstruction. Bile duct obstruction is not favored as the extrahepatic ducts are not identified. 2. Gallbladder / cystic duct patency can not be evaluated with no excretion into the external bile ducts. Electronically Signed   By: Suzy Bouchard M.D.   On: 08/09/2018 15:46   Ir Perc Cholecystostomy  Result Date: 08/09/2018 INDICATION: 84 year old male with acute calculus cholecystitis. He has a poor operative candidate and presents for placement of a percutaneous cholecystostomy tube. EXAM: CHOLECYSTOSTOMY MEDICATIONS: 2 g Ancef administered intravenously. ANESTHESIA/SEDATION: Moderate (conscious) sedation was employed during this procedure. A total of Versed 1 mg and Fentanyl 50 mcg was administered intravenously. Moderate Sedation Time: 13 minutes. The patient's level of consciousness and vital signs were monitored continuously by radiology nursing throughout the procedure under my direct supervision. FLUOROSCOPY TIME:  Fluoroscopy Time: 0 minutes 54 seconds (1.2 mGy). COMPLICATIONS: None immediate. PROCEDURE: Informed written consent was obtained from the patient after a thorough discussion of the procedural risks, benefits and alternatives. All questions were addressed. Maximal Sterile Barrier Technique was utilized including caps, mask, sterile gowns, sterile gloves, sterile  drape, hand hygiene and skin antiseptic. A timeout was performed prior to the initiation of the procedure. The right upper quadrant was interrogated with ultrasound. The gallbladder is dilated and diffusely thick walled. A suitable skin entry site was selected. Local anesthesia was attained by infiltration with 1% lidocaine. A small dermatotomy was made. Under real-time sonographic guidance, a 21 gauge Chiba needle was advanced along a short transhepatic course and into the gallbladder lumen. There was return of purulent bile. A wire was advanced of the gallbladder lumen. The Chiba needle was exchanged for a transitional 4 [place] dilator. A gentle hand injection of contrast material was performed opacifying the gallbladder lumen. A 0.035 Amplatz wire was then advanced in the gallbladder lumen. The transitional sheath was removed. The skin and transhepatic tract were dilated to 10 [place]. A Cook 10 [place] all-purpose drainage catheter was advanced over the wire and formed in the gallbladder. Aspiration of purulent bile was  performed and sent for Gram stain and culture. The catheter was then flushed and connected to gravity bag drainage. Images were obtained and stored for the medical record. The catheter was secured to the skin with 0 Prolene suture. IMPRESSION: Successful placement of a 10 French percutaneous transhepatic cholecystostomy tube for treatment of acute calculus cholecystitis in a poor operative candidate. Electronically Signed   By: Jacqulynn Cadet M.D.   On: 08/09/2018 16:55   Dg Chest Port 1 View  Result Date: 08/08/2018 CLINICAL DATA:  Decreased oral intake, some vomiting EXAM: PORTABLE CHEST 1 VIEW COMPARISON:  Chest x-ray of 02/17/2016 FINDINGS: No active infiltrate or effusion is seen. The lungs may be slightly hyperaerated. Mediastinal and hilar contours are unremarkable and the heart is mildly enlarged. No acute bony abnormality is seen. IMPRESSION: No active cardiopulmonary disease.   Borderline cardiomegaly. Electronically Signed   By: Ivar Drape M.D.   On: 08/08/2018 08:58   US Abdomen Limited Ruq  Result Date: 08/08/2018 CLINICAL DATA:  Right upper quadrant pain.  Vomiting. EXAM: ULTRASOUND ABDOMEN LIMITED RIGHT UPPER QUADRANT COMPARISON:  CT 08/08/2018. FINDINGS: Gallbladder: Sludge and large gallstone noted. Gallbladder wall thickness 3.6 mm. Pericholecystic fluid. Cholecystitis could not be excluded. Negative Murphy sign. Common bile duct: Diameter: 5.0 mm Liver: Left lobe not well visualized. No focal lesion identified. Within normal limits in parenchymal echogenicity. Portal vein is patent on color Doppler imaging with normal direction of blood flow towards the liver. Incidental note is made of right pleural effusion. IMPRESSION: 1. Gallbladder sludge and large gallstone. Gallbladder wall is thickened. Pericholecystic fluid noted. Cholecystitis could not be excluded. No biliary distention. 2.  Incidental note is made of right pleural effusion. Electronically Signed   By: Marcello Moores  Register   On: 08/08/2018 14:04     LOS: 2 days   Oren Binet, MD  [facility]  If 7PM-7AM, please contact night-coverage  Please page via [email or web address] [email or web address]-click on MD name and type text message  08/10/2018, 2:46 PM

## 2018-08-10 NOTE — Progress Notes (Signed)
[facility] Hospital GIP RN visit.  This is a related and covered GIP admission of 08/08/2018 with [facility] diagnosis of protein calorie malnutrition. Patient is DNR. Family activated EMS after patient experienced nausea, vomiting and confusion.  Hospice was notified. Patient was admitted for sepsis secondary to cholecystitis.   Visited with patient at bedside. Patient was awake and alert. No family present.  Patient is awake and alert. He states he wants to go home.   Medications: Rocephin 1gm IVPB QD, Flagyl 500mg  IVPB Q8hrs.   Per MD notes: -Percutaneous cholecystostomy  tube placed on 08/09/2018  Recommendations: - bile sent for culture - drain follow up in 4 weeks - Percutaneous chole drain intact To stay 4-6 weeks Needs to flush 5cc sterile saline daily Record OP IR scheduler will call with time and date of OP follow up  Goals of Care: DNR with symptom management of conditions. Patient would like to return home as soon possible.   Discharge planning: To be determined. Please Call [facility] if ambulance transport is needed at time of discharge.   Communication to [facility]: [facility] CSW left message for  Daughter.  Communication with [facility]:  Team updated.   Please call with with any hospice related questions.  Thank you,  Farrel Gordon, RN, CCM [facility] [address])  [phone]

## 2018-08-10 NOTE — Consult Note (Signed)
   [facility] CM Inpatient Consult   08/10/2018  [place] Schmitt Jan 03, 1926 [id number]   Chart was screened for admission in the [facility]. Chart review reveals patient/family did not revoke Hospice benefits and will continue to be followed by [facility].  Will sign off.  Natividad Brood, RN BSN [facility] Liaison  [phone] business mobile phone Toll free office [phone]

## 2018-08-10 NOTE — Progress Notes (Signed)
Referring Physician(s): Dr Shann Medal  Supervising Physician: Markus Daft  Patient Status:  [facility] - In-pt  Chief Complaint:  Cholecystitis  Subjective:  Percutaneous chole drain placed 12/18 in IR Pt has no complaints Denies pain Denies Nausea Tearful at times   Allergies: Aspirin  Medications: Prior to Admission medications   Medication Sig Start Date End Date Taking? Authorizing Provider  pantoprazole (PROTONIX) 40 MG tablet TAKE 1 TABLET BY MOUTH EVERY DAY Patient taking differently: Take 40 mg by mouth daily.  03/15/18  Yes Josue Hector, MD  QUEtiapine (SEROQUEL) 25 MG tablet Take 75 mg by mouth daily.   Yes [provider]  temazepam (RESTORIL) 15 MG capsule Take 15 mg by mouth at bedtime as needed for sleep.   Yes [provider]  amLODipine (NORVASC) 10 MG tablet Take 1 tablet (10 mg total) by mouth daily. Patient not taking: Reported on 08/08/2018 07/05/16   Imogene Burn, PA-C  atorvastatin (LIPITOR) 80 MG tablet TAKE 1 TABLET BY MOUTH EVERY DAY AT 6PM Patient not taking: Reported on 08/08/2018 02/01/18   Josue Hector, MD  carvedilol (COREG) 12.5 MG tablet TAKE 1 TABLET (12.5 MG TOTAL) BY MOUTH 2 (TWO) TIMES DAILY WITH A MEAL. Patient not taking: Reported on 08/08/2018 10/11/17   Josue Hector, MD  nitroGLYCERIN (NITROSTAT) 0.4 MG SL tablet Place 1 tablet (0.4 mg total) under the tongue every 5 (five) minutes x 3 doses as needed for chest pain. Patient not taking: Reported on 08/08/2018 11/07/17   Josue Hector, MD     Vital Signs: BP 116/61 (BP Location: Left Arm)   Pulse 66   Temp 98.3 F (36.8 C) (Oral)   Resp 18   SpO2 95%   Physical Exam Vitals signs reviewed.  Abdominal:     General: Bowel sounds are normal.     Palpations: Abdomen is soft.  Skin:    General: Skin is warm and dry.     Comments: Site of drain is clean and dry NT no bleeding OP bilious      Imaging: Ct Abdomen Pelvis Wo Contrast  Result Date:  08/08/2018 CLINICAL DATA:  84 year old with decreased oral intake for the past several weeks according to the patient's family, presenting now with acute onset of vomiting. EXAM: CT ABDOMEN AND PELVIS WITHOUT CONTRAST TECHNIQUE: Multidetector CT imaging of the abdomen and pelvis was performed following the standard protocol without IV contrast. COMPARISON:  None. FINDINGS: Beam hardening streak artifact over the upper abdomen as the patient is unable to raise the arms. Metallic beam hardening streak artifact related to the patient's LEFT hip prosthesis. Lower chest: Dependent atelectasis in the lower lobes bilaterally, RIGHT greater than LEFT. BILATERAL lower lobe bronchiectasis. Heart mildly enlarged. RIGHT coronary artery stent. Hepatobiliary: Normal unenhanced appearance of the liver. Very small gallstones are suspected in the dependent portion of the gallbladder. Gallbladder wall thickening up to approximately 7 mm. Pancreas: Normal unenhanced appearance. Spleen: Normal unenhanced appearance. Adrenals/Urinary Tract: Normal appearing adrenal glands. Diffuse cortical thinning involving both kidneys, RIGHT greater than LEFT. Allowing for the unenhanced technique, no focal parenchymal abnormality involving either kidney. No hydronephrosis. No urinary tract calculi. Normal appearing urinary bladder. Stomach/Bowel: Stomach normal in appearance for the degree of distention. Normal-appearing small bowel. Mobile cecum positioned in the RIGHT UPPER QUADRANT. Extensive distal descending and sigmoid colon diverticulosis without evidence of acute diverticulitis. Moderate rectal stool burden. Normal appendix in the RIGHT mid abdomen. 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  No evidence of aortic aneurysm. Upper normal sized retroperitoneal lymph nodes in the upper abdomen associated with edema/inflammation in the adjacent retroperitoneal fat. Reproductive: Prostate gland and seminal vesicles  obscured by the metallic beam hardening artifact from the LEFT hip prosthesis. Other: Prior LEFT inguinal hernia repair without evidence of recurrence. Musculoskeletal: Severe degenerative disc disease at L2-3 and L4-5 and moderate degenerative disc disease at L5-S1. Facet degenerative changes diffusely throughout the lumbar spine. Prior LEFT hip arthroplasty with anatomic alignment. No acute findings. Mild thoracolumbar dextroscoliosis. IMPRESSION: 1. Upper normal sized retroperitoneal lymph nodes in the upper abdomen associated with edema/inflammation in the adjacent retroperitoneal fat. Query early retroperitoneal fibrosis. 2. Gallbladder wall thickening up to 7 mm may indicate cholecystitis, either acute or chronic. Cholelithiasis is suspected. 3. Diffuse cortical thinning involving both kidneys, RIGHT greater than LEFT. 4. Extensive distal descending and sigmoid colon diverticulosis without evidence of acute diverticulitis. 5. Dependent atelectasis in the lower lobes bilaterally, RIGHT greater than LEFT. BILATERAL lower lobe bronchiectasis. 6.  Aortic Atherosclerosis (ICD10-170.0) Electronically Signed   By: Evangeline Dakin M.D.   On: 08/08/2018 11:37   Nm Hepatobiliary Liver Func  Result Date: 08/09/2018 CLINICAL DATA:  Mildly elevated liver enzymes and leukocytosis. No RIGHT upper quadrant pain. Ultrasound concern for cholecystitis. EXAM: NUCLEAR MEDICINE HEPATOBILIARY IMAGING TECHNIQUE: Sequential images of the abdomen were obtained out to 60 minutes following intravenous administration of radiopharmaceutical. RADIOPHARMACEUTICALS:  5.2 mCi Tc-60m  Choletec IV COMPARISON:  Ultrasound 08/08/2018 FINDINGS: Prompt clearance of radiotracer from the blood pool and homogeneous uptake in the liver. Imaging performed for 60 minutes. The common hepatic duct and common bile duct are not identified indication for secretion of radiotracer. Gallbladder could not be assessed of these conditions. IMPRESSION: 1. No  excretion of radiotracer with prompt uptake in liver. Differential include hepatitis, cholestasis, or biliary ductal obstruction. Bile duct obstruction is not favored as the extrahepatic ducts are not identified. 2. Gallbladder / cystic duct patency can not be evaluated with no excretion into the external bile ducts. Electronically Signed   By: Suzy Bouchard M.D.   On: 08/09/2018 15:46   Ir Perc Cholecystostomy  Result Date: 08/09/2018 INDICATION: 84 year old male with acute calculus cholecystitis. He has a poor operative candidate and presents for placement of a percutaneous cholecystostomy tube. EXAM: CHOLECYSTOSTOMY MEDICATIONS: 2 g Ancef administered intravenously. ANESTHESIA/SEDATION: Moderate (conscious) sedation was employed during this procedure. A total of Versed 1 mg and Fentanyl 50 mcg was administered intravenously. Moderate Sedation Time: 13 minutes. The patient's level of consciousness and vital signs were monitored continuously by radiology nursing throughout the procedure under my direct supervision. FLUOROSCOPY TIME:  Fluoroscopy Time: 0 minutes 54 seconds (1.2 mGy). COMPLICATIONS: None immediate. PROCEDURE: Informed written consent was obtained from the patient after a thorough discussion of the procedural risks, benefits and alternatives. All questions were addressed. Maximal Sterile Barrier Technique was utilized including caps, mask, sterile gowns, sterile gloves, sterile drape, hand hygiene and skin antiseptic. A timeout was performed prior to the initiation of the procedure. The right upper quadrant was interrogated with ultrasound. The gallbladder is dilated and diffusely thick walled. A suitable skin entry site was selected. Local anesthesia was attained by infiltration with 1% lidocaine. A small dermatotomy was made. Under real-time sonographic guidance, a 21 gauge Chiba needle was advanced along a short transhepatic course and into the gallbladder lumen. There was return of  purulent bile. A wire was advanced of the gallbladder lumen. The Chiba needle was exchanged for a transitional 4 [place]  dilator. A gentle hand injection of contrast material was performed opacifying the gallbladder lumen. A 0.035 Amplatz wire was then advanced in the gallbladder lumen. The transitional sheath was removed. The skin and transhepatic tract were dilated to 10 [place]. A Cook 10 [place] all-purpose drainage catheter was advanced over the wire and formed in the gallbladder. Aspiration of purulent bile was performed and sent for Gram stain and culture. The catheter was then flushed and connected to gravity bag drainage. Images were obtained and stored for the medical record. The catheter was secured to the skin with 0 Prolene suture. IMPRESSION: Successful placement of a 10 French percutaneous transhepatic cholecystostomy tube for treatment of acute calculus cholecystitis in a poor operative candidate. Electronically Signed   By: Jacqulynn Cadet M.D.   On: 08/09/2018 16:55   Dg Chest Port 1 View  Result Date: 08/08/2018 CLINICAL DATA:  Decreased oral intake, some vomiting EXAM: PORTABLE CHEST 1 VIEW COMPARISON:  Chest x-ray of 02/17/2016 FINDINGS: No active infiltrate or effusion is seen. The lungs may be slightly hyperaerated. Mediastinal and hilar contours are unremarkable and the heart is mildly enlarged. No acute bony abnormality is seen. IMPRESSION: No active cardiopulmonary disease.  Borderline cardiomegaly. Electronically Signed   By: Ivar Drape M.D.   On: 08/08/2018 08:58   US Abdomen Limited Ruq  Result Date: 08/08/2018 CLINICAL DATA:  Right upper quadrant pain.  Vomiting. EXAM: ULTRASOUND ABDOMEN LIMITED RIGHT UPPER QUADRANT COMPARISON:  CT 08/08/2018. FINDINGS: Gallbladder: Sludge and large gallstone noted. Gallbladder wall thickness 3.6 mm. Pericholecystic fluid. Cholecystitis could not be excluded. Negative Murphy sign. Common bile duct: Diameter: 5.0 mm Liver: Left lobe not well  visualized. No focal lesion identified. Within normal limits in parenchymal echogenicity. Portal vein is patent on color Doppler imaging with normal direction of blood flow towards the liver. Incidental note is made of right pleural effusion. IMPRESSION: 1. Gallbladder sludge and large gallstone. Gallbladder wall is thickened. Pericholecystic fluid noted. Cholecystitis could not be excluded. No biliary distention. 2.  Incidental note is made of right pleural effusion. Electronically Signed   By: Marcello Moores  Register   On: 08/08/2018 14:04    Labs:  CBC: Recent Labs    08/08/18 0916 08/08/18 1940 08/09/18 0009 08/10/18 0324  WBC 12.7* 15.1* 14.8* 10.1  HGB 11.0* 8.8* 9.7* 10.0*  HCT 35.2* 27.2* 30.1* 31.4*  PLT 165 129* 125* 139*    COAGS: Recent Labs    01/24/18 1535 08/08/18 1940 08/09/18 0728  INR 1.03 1.72 1.36    BMP: Recent Labs    02/07/18 0506 08/08/18 0916 08/08/18 1940 08/09/18 0009 08/10/18 0324  NA 147* 141  --  140 140  K 4.0 4.1  --  4.4 4.2  CL 120* 107  --  110 110  CO2 21* 22  --  23 23  GLUCOSE 111* 117*  --  95 121*  BUN 61* 19  --  20 23  CALCIUM 8.2* 9.0  --  8.1* 8.1*  CREATININE 1.66* 2.20* 1.71* 2.19* 2.07*  GFRNONAA 35* 25* 34* 26* 27*  GFRAA 41* 29* 40* 30* 32*    LIVER FUNCTION TESTS: Recent Labs    02/05/18 1140 08/08/18 0916 08/09/18 0009 08/10/18 0324  BILITOT 0.5 1.7* 2.2* 2.7*  AST 42* 379* 267* 122*  ALT 18 112* 130* 78*  ALKPHOS 54 194* 181* 182*  PROT 5.3* 5.8* 4.6* 4.6*  ALBUMIN 2.6* 3.0* 2.2* 2.2*    Assessment and Plan:  Percutaneous chole drain intact To stay  4-6 weeks Needs to flush 5cc sterile saline daily Record OP IR scheduler will call with time and date of OP follow up   Electronically Signed: Lavonia Drafts, PA-C 08/10/2018, 10:37 AM   I spent a total of 15 Minutes at the the patient's bedside AND on the patient's hospital floor or unit, greater than 50% of which was counseling/coordinating care for  perc chole drain

## 2018-08-10 NOTE — Progress Notes (Signed)
[facility] MSW note: Patient is a current [facility] patient that lives at home with daughter-Sherry and her fiance-Lars. Pt is a DNR. Pt denied pain on visit. Pt has intermittent confusion. Pt appeared pale in color. Pt did not eat breakfast this morning. Pt reported not having an appetite. Pt was only eating bites at home prior to the hospitalization. Pt was tearful at times. Pt eager to go home. MSW met with Bevely Palmer, RN liaison to discuss case. MSW attempted call to Daughter-Sherry to discuss hospital discharge plans. No answer. MSW left message. MSW offered emotional support and active listening.   Rhys Martini [phone]

## 2018-08-11 DIAGNOSIS — N184 Chronic kidney disease, stage 4 (severe): Secondary | ICD-10-CM

## 2018-08-11 LAB — COMPREHENSIVE METABOLIC PANEL
ALT: 44 U/L (ref 0–44)
ANION GAP: 9 (ref 5–15)
AST: 74 U/L — ABNORMAL HIGH (ref 15–41)
Albumin: 2 g/dL — ABNORMAL LOW (ref 3.5–5.0)
Alkaline Phosphatase: 156 U/L — ABNORMAL HIGH (ref 38–126)
BUN: 18 mg/dL (ref 8–23)
CO2: 20 mmol/L — ABNORMAL LOW (ref 22–32)
Calcium: 8.2 mg/dL — ABNORMAL LOW (ref 8.9–10.3)
Chloride: 110 mmol/L (ref 98–111)
Creatinine, Ser: 2.03 mg/dL — ABNORMAL HIGH (ref 0.61–1.24)
GFR calc non Af Amer: 28 mL/min — ABNORMAL LOW (ref >60)
GFR, EST AFRICAN AMERICAN: 32 mL/min — AB (ref >60)
Glucose, Bld: 108 mg/dL — ABNORMAL HIGH (ref 70–99)
Potassium: 4.1 mmol/L (ref 3.5–5.1)
Sodium: 139 mmol/L (ref 135–145)
Total Bilirubin: 2.3 mg/dL — ABNORMAL HIGH (ref 0.3–1.2)
Total Protein: 4.8 g/dL — ABNORMAL LOW (ref 6.5–8.1)

## 2018-08-11 MED ORDER — AMOXICILLIN-POT CLAVULANATE 500-125 MG PO TABS
1.0000 | ORAL_TABLET | Freq: Two times a day (BID) | ORAL | Status: DC
  Administered 2018-08-11: 500 mg via ORAL
  Filled 2018-08-11: qty 1

## 2018-08-11 MED ORDER — AMOXICILLIN-POT CLAVULANATE 500-125 MG PO TABS: 1.0000 | ORAL_TABLET | Freq: Two times a day (BID) | ORAL | 0 refills | Status: AC

## 2018-08-11 MED ORDER — ONDANSETRON 4 MG PO TBDP: 4.0000 mg | ORAL_TABLET | Freq: Three times a day (TID) | ORAL | 0 refills | Status: AC | PRN

## 2018-08-11 MED ORDER — ENSURE ENLIVE PO LIQD: 237.0000 mL | Freq: Two times a day (BID) | ORAL | 0 refills | Status: AC

## 2018-08-11 MED FILL — ONDANSETRON ODT 4 MG TABLET: 4 | 6 days supply | Qty: 20 | Fill #0

## 2018-08-11 MED FILL — AMOX-CLAV 500-125 MG TABLET: 500-125 | 7 days supply | Qty: 14 | Fill #0

## 2018-08-11 NOTE — Discharge Summary (Signed)
PATIENT DETAILS Name: Daniel Schmitt Age: 84 y.o. Sex: male Date of Birth: [birth year] MRN: [id number]. Admitting Physician: Thurnell Lose, MD PCP:Kim, Daniel Rinks, MD  Admit Date: 08/08/2018 Discharge date: 08/11/2018  Recommendations for Outpatient Follow-up:  1. Follow up with PCP in 1-2 weeks 2. Please ensure resumption of hospice care 3. Please ensure follow-up with IR and general surgery 4. Follow bile culture results until final  Admitted From:  Home with Hospice  Disposition: Home with [place]: No  Equipment/Devices: None  Discharge Condition: Stable  CODE STATUS: DNR  Diet recommendation:  Regular  Brief Summary: See H&P, Labs, Consult and Test reports for all details in brief, Patient is a 84 y.o. male with history of dementia, basal cell carcinoma of the face, CAD with PCI to RCA 2017, recent hip fracture requiring left hip replacement-being followed by hospice at home-presented with vomiting, fever, elevated LFTs and leukocytosis-thought to have cholecystitis and admitted to the hospitalist service.  Brief Hospital Course: Sepsis secondary to acute calculus cholecystitis: Sepsis pathophysiology has resolved, underwent percutaneous cholecystostomy tube placement on 12/18.    Clinically improved-has been transitioned to Augmentin on discharge.  Please follow cultures until final.  Please ensure follow-up with general surgery and IR drain clinic.  AKI on CKD stage IV: AKI likely hemodynamically mediated, downtrending with supportive care.  Follow electrolytes periodically.  CAD status post PCI to mid RCA 2017: Stable without any anginal symptoms.  Hypertension: Blood pressure now starting to creep up-suspect can be resumed on usual antihypertensives on discharge.  GERD: Continue PPI  Dementia: Pleasantly confused-remains at risk for delirium.  Advanced directive/palliative care: DNR in place-followed at home with hospice care.  Very  frail-elderly-best served by gentle medical treatment rather than aggressive care.  Procedures/Studies: 12/18>> percutaneous cholecystostomy drain by IR  Discharge Diagnoses:  Principal Problem:   Acute cholecystitis Active Problems:   CKD (chronic kidney disease)   History of GI bleed   Dementia (HCC)   Gastroesophageal reflux disease with esophagitis   Discharge Instructions:  Activity:  As tolerated with Full fall precautions use walker/cane & assistance as needed   Discharge Instructions    Diet general   Complete by:  As directed    Discharge instructions   Complete by:  As directed    Follow with Primary MD  [name], MD 1 week  Follow with general surgery as instructed  Please get a complete blood count and chemistry panel checked by your Primary MD at your next visit, and again as instructed by your Primary MD.  Get Medicines reviewed and adjusted: Please take all your medications with you for your next visit with your Primary MD  Laboratory/radiological data: Please request your Primary MD to go over all hospital tests and procedure/radiological results at the follow up, please ask your Primary MD to get all Hospital records sent to his/her office.  In some cases, they will be blood work, cultures and biopsy results pending at the time of your discharge. Please request that your primary care M.D. follows up on these results.  Also Note the following: If you experience worsening of your admission symptoms, develop shortness of breath, life threatening emergency, suicidal or homicidal thoughts you must seek medical attention immediately by calling 911 or calling your MD immediately  if symptoms less severe.  You must read complete instructions/literature along with all the possible adverse reactions/side effects for all the Medicines you take and that have been prescribed to you. Take  any new Medicines after you have completely understood and accpet all the  possible adverse reactions/side effects.   Do not drive when taking Pain medications or sleeping medications (Benzodaizepines)  Do not take more than prescribed Pain, Sleep and Anxiety Medications. It is not advisable to combine anxiety,sleep and pain medications without talking with your primary care practitioner  Special Instructions: If you have smoked or chewed Tobacco  in the last 2 yrs please stop smoking, stop any regular Alcohol  and or any Recreational drug use.  Wear Seat belts while driving.  Please note: You were cared for by a hospitalist during your hospital stay. Once you are discharged, your primary care physician will handle any further medical issues. Please note that NO REFILLS for any discharge medications will be authorized once you are discharged, as it is imperative that you return to your primary care physician (or establish a relationship with a primary care physician if you do not have one) for your post hospital discharge needs so that they can reassess your need for medications and monitor your lab values.   Increase activity slowly   Complete by:  As directed      Allergies as of 08/11/2018      Reactions   Aspirin Other (See Comments)   Stomach bleeds       Medication List    STOP taking these medications   amLODipine 10 MG tablet Commonly known as:  NORVASC     TAKE these medications   amoxicillin-clavulanate 500-125 MG tablet Commonly known as:  AUGMENTIN Take 1 tablet (500 mg total) by mouth 2 (two) times daily.   atorvastatin 80 MG tablet Commonly known as:  LIPITOR TAKE 1 TABLET BY MOUTH EVERY DAY AT 6PM   carvedilol 12.5 MG tablet Commonly known as:  COREG TAKE 1 TABLET (12.5 MG TOTAL) BY MOUTH 2 (TWO) TIMES DAILY WITH A MEAL.   feeding supplement (ENSURE ENLIVE) Liqd Take 237 mLs by mouth 2 (two) times daily between meals.   nitroGLYCERIN 0.4 MG SL tablet Commonly known as:  NITROSTAT Place 1 tablet (0.4 mg total) under the tongue  every 5 (five) minutes x 3 doses as needed for chest pain.   ondansetron 4 MG disintegrating tablet Commonly known as:  ZOFRAN ODT Take 1 tablet (4 mg total) by mouth every 8 (eight) hours as needed for nausea or vomiting.   pantoprazole 40 MG tablet Commonly known as:  PROTONIX TAKE 1 TABLET BY MOUTH EVERY DAY   QUEtiapine 25 MG tablet Commonly known as:  SEROQUEL Take 75 mg by mouth daily.   temazepam 15 MG capsule Commonly known as:  RESTORIL Take 15 mg by mouth at bedtime as needed for sleep.      Follow-up Information    Jacqulynn Cadet, MD Follow up in 6 week(s).   Specialties:  Interventional Radiology, Radiology Why:  follow up at Hospital for drain injection-- pt will hear from [place] appt time and date--- call [phone] if any needs Contact information: [address] [place] [address] [phone]        Erroll Luna, MD Follow up on 09/25/2018.   Specialty:  General Surgery Why:  1:40pm, arrive by 1:10pm for paperwork and check in.  please bring photo ID and insurance card Contact information: [address] [place] [address] [phone]        [name], MD. Schedule an appointment as soon as possible for a visit in 1 week(s).  Specialty:  Internal Medicine Contact information: [place] [phone] [phone]          Allergies  Allergen Reactions  . Aspirin Other (See Comments)    Stomach bleeds     Consultations:   general surgery and IR  Other Procedures/Studies: Ct Abdomen Pelvis Wo Contrast  Result Date: 08/08/2018 CLINICAL DATA:  84 year old with decreased oral intake for the past several weeks according to the patient's family, presenting now with acute onset of vomiting. EXAM: CT ABDOMEN AND PELVIS WITHOUT CONTRAST TECHNIQUE: Multidetector CT imaging of the abdomen and pelvis was performed following the standard protocol without IV contrast.  COMPARISON:  None. FINDINGS: Beam hardening streak artifact over the upper abdomen as the patient is unable to raise the arms. Metallic beam hardening streak artifact related to the patient's LEFT hip prosthesis. Lower chest: Dependent atelectasis in the lower lobes bilaterally, RIGHT greater than LEFT. BILATERAL lower lobe bronchiectasis. Heart mildly enlarged. RIGHT coronary artery stent. Hepatobiliary: Normal unenhanced appearance of the liver. Very small gallstones are suspected in the dependent portion of the gallbladder. Gallbladder wall thickening up to approximately 7 mm. Pancreas: Normal unenhanced appearance. Spleen: Normal unenhanced appearance. Adrenals/Urinary Tract: Normal appearing adrenal glands. Diffuse cortical thinning involving both kidneys, RIGHT greater than LEFT. Allowing for the unenhanced technique, no focal parenchymal abnormality involving either kidney. No hydronephrosis. No urinary tract calculi. Normal appearing urinary bladder. Stomach/Bowel: Stomach normal in appearance for the degree of distention. Normal-appearing small bowel. Mobile cecum positioned in the RIGHT UPPER QUADRANT. Extensive distal descending and sigmoid colon diverticulosis without evidence of acute diverticulitis. Moderate rectal stool burden. Normal appendix in the RIGHT mid abdomen. 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 No evidence of aortic aneurysm. Upper normal sized retroperitoneal lymph nodes in the upper abdomen associated with edema/inflammation in the adjacent retroperitoneal fat. Reproductive: Prostate gland and seminal vesicles obscured by the metallic beam hardening artifact from the LEFT hip prosthesis. Other: Prior LEFT inguinal hernia repair without evidence of recurrence. Musculoskeletal: Severe degenerative disc disease at L2-3 and L4-5 and moderate degenerative disc disease at L5-S1. Facet degenerative changes diffusely throughout the lumbar spine. Prior LEFT hip  arthroplasty with anatomic alignment. No acute findings. Mild thoracolumbar dextroscoliosis. IMPRESSION: 1. Upper normal sized retroperitoneal lymph nodes in the upper abdomen associated with edema/inflammation in the adjacent retroperitoneal fat. Query early retroperitoneal fibrosis. 2. Gallbladder wall thickening up to 7 mm may indicate cholecystitis, either acute or chronic. Cholelithiasis is suspected. 3. Diffuse cortical thinning involving both kidneys, RIGHT greater than LEFT. 4. Extensive distal descending and sigmoid colon diverticulosis without evidence of acute diverticulitis. 5. Dependent atelectasis in the lower lobes bilaterally, RIGHT greater than LEFT. BILATERAL lower lobe bronchiectasis. 6.  Aortic Atherosclerosis (ICD10-170.0) Electronically Signed   By: Evangeline Dakin M.D.   On: 08/08/2018 11:37   Nm Hepatobiliary Liver Func  Result Date: 08/09/2018 CLINICAL DATA:  Mildly elevated liver enzymes and leukocytosis. No RIGHT upper quadrant pain. Ultrasound concern for cholecystitis. EXAM: NUCLEAR MEDICINE HEPATOBILIARY IMAGING TECHNIQUE: Sequential images of the abdomen were obtained out to 60 minutes following intravenous administration of radiopharmaceutical. RADIOPHARMACEUTICALS:  5.2 mCi Tc-37m  Choletec IV COMPARISON:  Ultrasound 08/08/2018 FINDINGS: Prompt clearance of radiotracer from the blood pool and homogeneous uptake in the liver. Imaging performed for 60 minutes. The common hepatic duct and common bile duct are not identified indication for secretion of radiotracer. Gallbladder could not be assessed of these conditions. IMPRESSION: 1. No excretion of radiotracer with prompt uptake in liver.  Differential include hepatitis, cholestasis, or biliary ductal obstruction. Bile duct obstruction is not favored as the extrahepatic ducts are not identified. 2. Gallbladder / cystic duct patency can not be evaluated with no excretion into the external bile ducts. Electronically Signed   By:  Suzy Bouchard M.D.   On: 08/09/2018 15:46   Ir Perc Cholecystostomy  Result Date: 08/09/2018 INDICATION: 84 year old male with acute calculus cholecystitis. He has a poor operative candidate and presents for placement of a percutaneous cholecystostomy tube. EXAM: CHOLECYSTOSTOMY MEDICATIONS: 2 g Ancef administered intravenously. ANESTHESIA/SEDATION: Moderate (conscious) sedation was employed during this procedure. A total of Versed 1 mg and Fentanyl 50 mcg was administered intravenously. Moderate Sedation Time: 13 minutes. The patient's level of consciousness and vital signs were monitored continuously by radiology nursing throughout the procedure under my direct supervision. FLUOROSCOPY TIME:  Fluoroscopy Time: 0 minutes 54 seconds (1.2 mGy). COMPLICATIONS: None immediate. PROCEDURE: Informed written consent was obtained from the patient after a thorough discussion of the procedural risks, benefits and alternatives. All questions were addressed. Maximal Sterile Barrier Technique was utilized including caps, mask, sterile gowns, sterile gloves, sterile drape, hand hygiene and skin antiseptic. A timeout was performed prior to the initiation of the procedure. The right upper quadrant was interrogated with ultrasound. The gallbladder is dilated and diffusely thick walled. A suitable skin entry site was selected. Local anesthesia was attained by infiltration with 1% lidocaine. A small dermatotomy was made. Under real-time sonographic guidance, a 21 gauge Chiba needle was advanced along a short transhepatic course and into the gallbladder lumen. There was return of purulent bile. A wire was advanced of the gallbladder lumen. The Chiba needle was exchanged for a transitional 4 [place] dilator. A gentle hand injection of contrast material was performed opacifying the gallbladder lumen. A 0.035 Amplatz wire was then advanced in the gallbladder lumen. The transitional sheath was removed. The skin and transhepatic  tract were dilated to 10 [place]. A Cook 10 [place] all-purpose drainage catheter was advanced over the wire and formed in the gallbladder. Aspiration of purulent bile was performed and sent for Gram stain and culture. The catheter was then flushed and connected to gravity bag drainage. Images were obtained and stored for the medical record. The catheter was secured to the skin with 0 Prolene suture. IMPRESSION: Successful placement of a 10 French percutaneous transhepatic cholecystostomy tube for treatment of acute calculus cholecystitis in a poor operative candidate. Electronically Signed   By: Jacqulynn Cadet M.D.   On: 08/09/2018 16:55   Dg Chest Port 1 View  Result Date: 08/08/2018 CLINICAL DATA:  Decreased oral intake, some vomiting EXAM: PORTABLE CHEST 1 VIEW COMPARISON:  Chest x-ray of 02/17/2016 FINDINGS: No active infiltrate or effusion is seen. The lungs may be slightly hyperaerated. Mediastinal and hilar contours are unremarkable and the heart is mildly enlarged. No acute bony abnormality is seen. IMPRESSION: No active cardiopulmonary disease.  Borderline cardiomegaly. Electronically Signed   By: Ivar Drape M.D.   On: 08/08/2018 08:58   US Abdomen Limited Ruq  Result Date: 08/08/2018 CLINICAL DATA:  Right upper quadrant pain.  Vomiting. EXAM: ULTRASOUND ABDOMEN LIMITED RIGHT UPPER QUADRANT COMPARISON:  CT 08/08/2018. FINDINGS: Gallbladder: Sludge and large gallstone noted. Gallbladder wall thickness 3.6 mm. Pericholecystic fluid. Cholecystitis could not be excluded. Negative Murphy sign. Common bile duct: Diameter: 5.0 mm Liver: Left lobe not well visualized. No focal lesion identified. Within normal limits in parenchymal echogenicity. Portal vein is patent on color Doppler imaging with normal direction of  blood flow towards the liver. Incidental note is made of right pleural effusion. IMPRESSION: 1. Gallbladder sludge and large gallstone. Gallbladder wall is thickened. Pericholecystic fluid  noted. Cholecystitis could not be excluded. No biliary distention. 2.  Incidental note is made of right pleural effusion. Electronically Signed   By: [place]   On: 08/08/2018 14:04     TODAY-DAY OF DISCHARGE:  Subjective:   Daniel Schmitt today has no headache,no chest abdominal pain,no new weakness tingling or numbness, feels much better wants to go home today.   Objective:   Blood pressure 138/78, pulse 88, temperature 98.1 F (36.7 C), resp. rate 19, SpO2 95 %.  Intake/Output Summary (Last 24 hours) at 08/11/2018 1002 Last data filed at 08/11/2018 0703 Gross per 24 hour  Intake 55 ml  Output 575 ml  Net -520 ml   There were no vitals filed for this visit.  Exam: Awake Alert, Oriented *3, No new F.N deficits, Normal affect [place].AT,PERRAL Supple Neck,No JVD, No cervical lymphadenopathy appriciated.  Symmetrical Chest wall movement, Good air movement bilaterally, CTAB RRR,No Gallops,Rubs or new Murmurs, No Parasternal Heave +ve B.Sounds, Abd Soft, Non tender, No organomegaly appriciated, No rebound -guarding or rigidity. No Cyanosis, Clubbing or edema, No new Rash or bruise   PERTINENT RADIOLOGIC STUDIES: Ct Abdomen Pelvis Wo Contrast  Result Date: 08/08/2018 CLINICAL DATA:  84 year old with decreased oral intake for the past several weeks according to the patient's family, presenting now with acute onset of vomiting. EXAM: CT ABDOMEN AND PELVIS WITHOUT CONTRAST TECHNIQUE: Multidetector CT imaging of the abdomen and pelvis was performed following the standard protocol without IV contrast. COMPARISON:  None. FINDINGS: Beam hardening streak artifact over the upper abdomen as the patient is unable to raise the arms. Metallic beam hardening streak artifact related to the patient's LEFT hip prosthesis. Lower chest: Dependent atelectasis in the lower lobes bilaterally, RIGHT greater than LEFT. BILATERAL lower lobe bronchiectasis. Heart mildly enlarged. RIGHT coronary artery  stent. Hepatobiliary: Normal unenhanced appearance of the liver. Very small gallstones are suspected in the dependent portion of the gallbladder. Gallbladder wall thickening up to approximately 7 mm. Pancreas: Normal unenhanced appearance. Spleen: Normal unenhanced appearance. Adrenals/Urinary Tract: Normal appearing adrenal glands. Diffuse cortical thinning involving both kidneys, RIGHT greater than LEFT. Allowing for the unenhanced technique, no focal parenchymal abnormality involving either kidney. No hydronephrosis. No urinary tract calculi. Normal appearing urinary bladder. Stomach/Bowel: Stomach normal in appearance for the degree of distention. Normal-appearing small bowel. Mobile cecum positioned in the RIGHT UPPER QUADRANT. Extensive distal descending and sigmoid colon diverticulosis without evidence of acute diverticulitis. Moderate rectal stool burden. Normal appendix in the RIGHT mid abdomen. 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 No evidence of aortic aneurysm. Upper normal sized retroperitoneal lymph nodes in the upper abdomen associated with edema/inflammation in the adjacent retroperitoneal fat. Reproductive: Prostate gland and seminal vesicles obscured by the metallic beam hardening artifact from the LEFT hip prosthesis. Other: Prior LEFT inguinal hernia repair without evidence of recurrence. Musculoskeletal: Severe degenerative disc disease at L2-3 and L4-5 and moderate degenerative disc disease at L5-S1. Facet degenerative changes diffusely throughout the lumbar spine. Prior LEFT hip arthroplasty with anatomic alignment. No acute findings. Mild thoracolumbar dextroscoliosis. IMPRESSION: 1. Upper normal sized retroperitoneal lymph nodes in the upper abdomen associated with edema/inflammation in the adjacent retroperitoneal fat. Query early retroperitoneal fibrosis. 2. Gallbladder wall thickening up to 7 mm may indicate cholecystitis, either acute or chronic.  Cholelithiasis is suspected. 3. Diffuse cortical thinning involving both kidneys, RIGHT  greater than LEFT. 4. Extensive distal descending and sigmoid colon diverticulosis without evidence of acute diverticulitis. 5. Dependent atelectasis in the lower lobes bilaterally, RIGHT greater than LEFT. BILATERAL lower lobe bronchiectasis. 6.  Aortic Atherosclerosis (ICD10-170.0) Electronically Signed   By: Evangeline Dakin M.D.   On: 08/08/2018 11:37   Nm Hepatobiliary Liver Func  Result Date: 08/09/2018 CLINICAL DATA:  Mildly elevated liver enzymes and leukocytosis. No RIGHT upper quadrant pain. Ultrasound concern for cholecystitis. EXAM: NUCLEAR MEDICINE HEPATOBILIARY IMAGING TECHNIQUE: Sequential images of the abdomen were obtained out to 60 minutes following intravenous administration of radiopharmaceutical. RADIOPHARMACEUTICALS:  5.2 mCi Tc-103m  Choletec IV COMPARISON:  Ultrasound 08/08/2018 FINDINGS: Prompt clearance of radiotracer from the blood pool and homogeneous uptake in the liver. Imaging performed for 60 minutes. The common hepatic duct and common bile duct are not identified indication for secretion of radiotracer. Gallbladder could not be assessed of these conditions. IMPRESSION: 1. No excretion of radiotracer with prompt uptake in liver. Differential include hepatitis, cholestasis, or biliary ductal obstruction. Bile duct obstruction is not favored as the extrahepatic ducts are not identified. 2. Gallbladder / cystic duct patency can not be evaluated with no excretion into the external bile ducts. Electronically Signed   By: Suzy Bouchard M.D.   On: 08/09/2018 15:46   Ir Perc Cholecystostomy  Result Date: 08/09/2018 INDICATION: 84 year old male with acute calculus cholecystitis. He has a poor operative candidate and presents for placement of a percutaneous cholecystostomy tube. EXAM: CHOLECYSTOSTOMY MEDICATIONS: 2 g Ancef administered intravenously. ANESTHESIA/SEDATION: Moderate (conscious)  sedation was employed during this procedure. A total of Versed 1 mg and Fentanyl 50 mcg was administered intravenously. Moderate Sedation Time: 13 minutes. The patient's level of consciousness and vital signs were monitored continuously by radiology nursing throughout the procedure under my direct supervision. FLUOROSCOPY TIME:  Fluoroscopy Time: 0 minutes 54 seconds (1.2 mGy). COMPLICATIONS: None immediate. PROCEDURE: Informed written consent was obtained from the patient after a thorough discussion of the procedural risks, benefits and alternatives. All questions were addressed. Maximal Sterile Barrier Technique was utilized including caps, mask, sterile gowns, sterile gloves, sterile drape, hand hygiene and skin antiseptic. A timeout was performed prior to the initiation of the procedure. The right upper quadrant was interrogated with ultrasound. The gallbladder is dilated and diffusely thick walled. A suitable skin entry site was selected. Local anesthesia was attained by infiltration with 1% lidocaine. A small dermatotomy was made. Under real-time sonographic guidance, a 21 gauge Chiba needle was advanced along a short transhepatic course and into the gallbladder lumen. There was return of purulent bile. A wire was advanced of the gallbladder lumen. The Chiba needle was exchanged for a transitional 4 [place] dilator. A gentle hand injection of contrast material was performed opacifying the gallbladder lumen. A 0.035 Amplatz wire was then advanced in the gallbladder lumen. The transitional sheath was removed. The skin and transhepatic tract were dilated to 10 [place]. A Cook 10 [place] all-purpose drainage catheter was advanced over the wire and formed in the gallbladder. Aspiration of purulent bile was performed and sent for Gram stain and culture. The catheter was then flushed and connected to gravity bag drainage. Images were obtained and stored for the medical record. The catheter was secured to the skin with 0  Prolene suture. IMPRESSION: Successful placement of a 10 French percutaneous transhepatic cholecystostomy tube for treatment of acute calculus cholecystitis in a poor operative candidate. Electronically Signed   By: Jacqulynn Cadet M.D.   On: 08/09/2018 16:55  Dg Chest Port 1 View  Result Date: 08/08/2018 CLINICAL DATA:  Decreased oral intake, some vomiting EXAM: PORTABLE CHEST 1 VIEW COMPARISON:  Chest x-ray of 02/17/2016 FINDINGS: No active infiltrate or effusion is seen. The lungs may be slightly hyperaerated. Mediastinal and hilar contours are unremarkable and the heart is mildly enlarged. No acute bony abnormality is seen. IMPRESSION: No active cardiopulmonary disease.  Borderline cardiomegaly. Electronically Signed   By: Ivar Drape M.D.   On: 08/08/2018 08:58   US Abdomen Limited Ruq  Result Date: 08/08/2018 CLINICAL DATA:  Right upper quadrant pain.  Vomiting. EXAM: ULTRASOUND ABDOMEN LIMITED RIGHT UPPER QUADRANT COMPARISON:  CT 08/08/2018. FINDINGS: Gallbladder: Sludge and large gallstone noted. Gallbladder wall thickness 3.6 mm. Pericholecystic fluid. Cholecystitis could not be excluded. Negative Murphy sign. Common bile duct: Diameter: 5.0 mm Liver: Left lobe not well visualized. No focal lesion identified. Within normal limits in parenchymal echogenicity. Portal vein is patent on color Doppler imaging with normal direction of blood flow towards the liver. Incidental note is made of right pleural effusion. IMPRESSION: 1. Gallbladder sludge and large gallstone. Gallbladder wall is thickened. Pericholecystic fluid noted. Cholecystitis could not be excluded. No biliary distention. 2.  Incidental note is made of right pleural effusion. Electronically Signed   By: Marcello Moores  Register   On: 08/08/2018 14:04     PERTINENT LAB RESULTS: CBC: Recent Labs    08/09/18 0009 08/10/18 0324  WBC 14.8* 10.1  HGB 9.7* 10.0*  HCT 30.1* 31.4*  PLT 125* 139*   CMET CMP     Component Value Date/Time    NA 139 08/11/2018 0644   K 4.1 08/11/2018 0644   CL 110 08/11/2018 0644   CO2 20 (L) 08/11/2018 0644   GLUCOSE 108 (H) 08/11/2018 0644   BUN 18 08/11/2018 0644   CREATININE 2.03 (H) 08/11/2018 0644   CREATININE 1.90 (H) 10/11/2017 1344   CREATININE 1.48 (H) 04/09/2016 1200   CALCIUM 8.2 (L) 08/11/2018 0644   PROT 4.8 (L) 08/11/2018 0644   ALBUMIN 2.0 (L) 08/11/2018 0644   AST 74 (H) 08/11/2018 0644   AST 29 10/11/2017 1344   ALT 44 08/11/2018 0644   ALT 15 10/11/2017 1344   ALKPHOS 156 (H) 08/11/2018 0644   BILITOT 2.3 (H) 08/11/2018 0644   BILITOT 0.9 10/11/2017 1344   GFRNONAA 28 (L) 08/11/2018 0644   GFRAA 32 (L) 08/11/2018 0644    GFR CrCl cannot be calculated (Unknown ideal weight.). No results for input(s): LIPASE, AMYLASE in the last 72 hours. No results for input(s): CKTOTAL, CKMB, CKMBINDEX, TROPONINI in the last 72 hours. Invalid input(s): POCBNP No results for input(s): DDIMER in the last 72 hours. No results for input(s): HGBA1C in the last 72 hours. No results for input(s): CHOL, HDL, LDLCALC, TRIG, CHOLHDL, LDLDIRECT in the last 72 hours. No results for input(s): TSH, T4TOTAL, T3FREE, THYROIDAB in the last 72 hours.  Invalid input(s): FREET3 No results for input(s): VITAMINB12, FOLATE, FERRITIN, TIBC, IRON, RETICCTPCT in the last 72 hours. Coags: Recent Labs    08/08/18 1940 08/09/18 0728  INR 1.72 1.36   Microbiology: Recent Results (from the past 240 hour(s))  Culture, blood (single)     Status: None (Preliminary result)   Collection Time: 08/08/18  9:16 AM  Result Value Ref Range Status   Specimen Description BLOOD RIGHT ANTECUBITAL  Final   Special Requests   Final    BOTTLES DRAWN AEROBIC AND ANAEROBIC Blood Culture adequate volume   Culture   Final  NO GROWTH 2 DAYS Performed at [facility], [place] [address], [place], [place] [address]    Report Status PENDING  Incomplete  Culture, blood (Routine X 2) w Reflex to ID Panel      Status: None (Preliminary result)   Collection Time: 08/08/18  9:25 AM  Result Value Ref Range Status   Specimen Description BLOOD RIGHT FOREARM  Final   Special Requests   Final    BOTTLES DRAWN AEROBIC AND ANAEROBIC Blood Culture results may not be optimal due to an inadequate volume of blood received in culture bottles   Culture   Final    NO GROWTH 2 DAYS Performed at [facility], [place] [address] [place], [place] [address]    Report Status PENDING  Incomplete  Body fluid culture     Status: None (Preliminary result)   Collection Time: 08/09/18  7:05 PM  Result Value Ref Range Status   Specimen Description BILE  Final   Special Requests Normal  Final   Gram Stain   Final    RARE WBC PRESENT, PREDOMINANTLY PMN MODERATE GRAM POSITIVE COCCI    Culture   Final    CULTURE REINCUBATED FOR BETTER GROWTH Performed at [facility], [place] [address], [place], [place] [address]    Report Status PENDING  Incomplete    FURTHER DISCHARGE INSTRUCTIONS:  Get Medicines reviewed and adjusted: Please take all your medications with you for your next visit with your Primary MD  Laboratory/radiological data: Please request your Primary MD to go over all hospital tests and procedure/radiological results at the follow up, please ask your Primary MD to get all Hospital records sent to his/her office.  In some cases, they will be blood work, cultures and biopsy results pending at the time of your discharge. Please request that your primary care M.D. goes through all the records of your hospital data and follows up on these results.  Also Note the following: If you experience worsening of your admission symptoms, develop shortness of breath, life threatening emergency, suicidal or homicidal thoughts you must seek medical attention immediately by calling 911 or calling your MD immediately  if symptoms less severe.  You must read complete instructions/literature along with all the possible  adverse reactions/side effects for all the Medicines you take and that have been prescribed to you. Take any new Medicines after you have completely understood and accpet all the possible adverse reactions/side effects.   Do not drive when taking Pain medications or sleeping medications (Benzodaizepines)  Do not take more than prescribed Pain, Sleep and Anxiety Medications. It is not advisable to combine anxiety,sleep and pain medications without talking with your primary care practitioner  Special Instructions: If you have smoked or chewed Tobacco  in the last 2 yrs please stop smoking, stop any regular Alcohol  and or any Recreational drug use.  Wear Seat belts while driving.  Please note: You were cared for by a hospitalist during your hospital stay. Once you are discharged, your primary care physician will handle any further medical issues. Please note that NO REFILLS for any discharge medications will be authorized once you are discharged, as it is imperative that you return to your primary care physician (or establish a relationship with a primary care physician if you do not have one) for your post hospital discharge needs so that they can reassess your need for medications and monitor your lab values.  Total Time spent coordinating discharge including counseling, education  and face to face time equals 35 minutes.  Signed: Jearldean Gutt 08/11/2018 10:02 AM

## 2018-08-11 NOTE — Progress Notes (Signed)
Pt O2 sats in 80s. Pt placed on 2L [place]. O2 sat is now 95% MD paged. Will continue to monitor.

## 2018-08-11 NOTE — Progress Notes (Signed)
Pt discharged home by [facility]. IV removed by prior RN, Judson Roch. Pt's daughter [name] notified of patient discharging home. All education and care plan points completed.

## 2018-08-11 NOTE — Progress Notes (Signed)
Pt requiring oxygen .[facility]land Kitchen[facility]land [facility] liaison/ Audrea Muscat [phone]) setting up home oxygen. Pt will d/c to home once oxygen therapy setup in home. NCM made bedside nurse and daughter aware. Whitman Hero RN,BSN,CM

## 2018-08-11 NOTE — Final Consult Note (Signed)
Consultant Final Sign-Off Note    Assessment/Final recommendations  Daniel Schmitt is a 84 y.o. male followed by me for cholecystitis S/p percutaneous cholecystostomy tube placement 12/18   Wound care (if applicable):    Diet at discharge: per primary team   Activity at discharge: per primary team   Follow-up appointment: 6 weeks Dr. Brantley Stage   Pending results:  Unresulted Labs (From admission, onward)    Start     Ordered   08/08/18 0840  Urinalysis, Routine w reflex microscopic  ONCE - STAT,   R     08/08/18 0840           Medication recommendations: abx for 2 weeks total   Other recommendations:    Thank you for allowing [place] to participate in the care of your patient!  Please consult [place] again if you have further needs for your patient.  Kalman Drape 08/11/2018 8:40 AM    Subjective   CC; Cholecystitis No abdominal pain this am. He thinks he ate yesterday he said to ask his daughter if he did.   Objective  Vital signs in last 24 hours: Temp:  [98.1 F (36.7 C)-99.5 F (37.5 C)] 98.1 F (36.7 C) (12/20 0524) Pulse Rate:  [73-88] 88 (12/20 0524) Resp:  [19] 19 (12/19 2210) BP: (128-138)/(65-78) 138/78 (12/20 0524) SpO2:  [87 %-95 %] 95 % (12/20 0542)  Gen:  Alert, NAD, pleasant Pulm:  effort normal Abd: Soft, ND, drain in place   Pertinent labs and Studies: Recent Labs    08/08/18 1940 08/09/18 0009 08/10/18 0324  WBC 15.1* 14.8* 10.1  HGB 8.8* 9.7* 10.0*  HCT 27.2* 30.1* 31.4*   BMET Recent Labs    08/10/18 0324 08/11/18 0644  NA 140 139  K 4.2 4.1  CL 110 110  CO2 23 20*  GLUCOSE 121* 108*  BUN 23 18  CREATININE 2.07* 2.03*  CALCIUM 8.1* 8.2*   No results for input(s): LABURIN in the last 72 hours. Results for orders placed or performed during the hospital encounter of 08/08/18  Culture, blood (single)     Status: None (Preliminary result)   Collection Time: 08/08/18  9:16 AM  Result Value Ref Range Status   Specimen  Description BLOOD RIGHT ANTECUBITAL  Final   Special Requests   Final    BOTTLES DRAWN AEROBIC AND ANAEROBIC Blood Culture adequate volume   Culture   Final    NO GROWTH 2 DAYS Performed at [facility], [place] [address], [place], [place] [address]    Report Status PENDING  Incomplete  Culture, blood (Routine X 2) w Reflex to ID Panel     Status: None (Preliminary result)   Collection Time: 08/08/18  9:25 AM  Result Value Ref Range Status   Specimen Description BLOOD RIGHT FOREARM  Final   Special Requests   Final    BOTTLES DRAWN AEROBIC AND ANAEROBIC Blood Culture results may not be optimal due to an inadequate volume of blood received in culture bottles   Culture   Final    NO GROWTH 2 DAYS Performed at [facility], [place] [address] [place], [place] [address]    Report Status PENDING  Incomplete  Body fluid culture     Status: None (Preliminary result)   Collection Time: 08/09/18  7:05 PM  Result Value Ref Range Status   Specimen Description BILE  Final   Special Requests Normal  Final   Gram Stain   Final  RARE WBC PRESENT, PREDOMINANTLY PMN MODERATE GRAM POSITIVE COCCI    Culture   Final    CULTURE REINCUBATED FOR BETTER GROWTH Performed at [facility], [place] [address] [place], [place] [address]    Report Status PENDING  Incomplete    Imaging: No results found.

## 2018-08-11 NOTE — Progress Notes (Addendum)
NCM f/u with daughter [name] [phone] )   regarding home oxygen delivery. [name] stated [facility] to delivery oxygen to home between 4pm-7pm. NCM made bedside nurse aware. Whitman Hero RN,BSN,CM

## 2018-08-11 NOTE — Progress Notes (Signed)
[facility]) Hospital GIP RN note.  Patient will need Oxygen to go home. This has been ordered with [facility] with a scheduled delivery between 4-7 PM today.   For ambulance transport please call [facility].   Please call for any hospice related questions.  Thank you, Farrel Gordon, RN, CCM [facility] [address])  [phone]

## 2018-08-12 DIAGNOSIS — E46 Unspecified protein-calorie malnutrition: Secondary | ICD-10-CM | POA: Diagnosis not present

## 2018-08-12 DIAGNOSIS — I2510 Atherosclerotic heart disease of native coronary artery without angina pectoris: Secondary | ICD-10-CM | POA: Diagnosis not present

## 2018-08-12 DIAGNOSIS — N189 Chronic kidney disease, unspecified: Secondary | ICD-10-CM | POA: Diagnosis not present

## 2018-08-12 DIAGNOSIS — K219 Gastro-esophageal reflux disease without esophagitis: Secondary | ICD-10-CM | POA: Diagnosis not present

## 2018-08-12 DIAGNOSIS — G309 Alzheimer's disease, unspecified: Secondary | ICD-10-CM | POA: Diagnosis not present

## 2018-08-12 DIAGNOSIS — I10 Essential (primary) hypertension: Secondary | ICD-10-CM | POA: Diagnosis not present

## 2018-08-12 LAB — BODY FLUID CULTURE: Special Requests: NORMAL

## 2018-08-13 LAB — CULTURE, BLOOD (ROUTINE X 2): CULTURE: NO GROWTH

## 2018-08-13 LAB — CULTURE, BLOOD (SINGLE)
CULTURE: NO GROWTH
SPECIAL REQUESTS: ADEQUATE

## 2018-08-14 DIAGNOSIS — K219 Gastro-esophageal reflux disease without esophagitis: Secondary | ICD-10-CM | POA: Diagnosis not present

## 2018-08-14 DIAGNOSIS — E46 Unspecified protein-calorie malnutrition: Secondary | ICD-10-CM | POA: Diagnosis not present

## 2018-08-14 DIAGNOSIS — N189 Chronic kidney disease, unspecified: Secondary | ICD-10-CM | POA: Diagnosis not present

## 2018-08-14 DIAGNOSIS — G309 Alzheimer's disease, unspecified: Secondary | ICD-10-CM | POA: Diagnosis not present

## 2018-08-14 DIAGNOSIS — I10 Essential (primary) hypertension: Secondary | ICD-10-CM | POA: Diagnosis not present

## 2018-08-14 DIAGNOSIS — I2510 Atherosclerotic heart disease of native coronary artery without angina pectoris: Secondary | ICD-10-CM | POA: Diagnosis not present

## 2018-08-15 DIAGNOSIS — K219 Gastro-esophageal reflux disease without esophagitis: Secondary | ICD-10-CM | POA: Diagnosis not present

## 2018-08-15 DIAGNOSIS — I2510 Atherosclerotic heart disease of native coronary artery without angina pectoris: Secondary | ICD-10-CM | POA: Diagnosis not present

## 2018-08-15 DIAGNOSIS — G309 Alzheimer's disease, unspecified: Secondary | ICD-10-CM | POA: Diagnosis not present

## 2018-08-15 DIAGNOSIS — E46 Unspecified protein-calorie malnutrition: Secondary | ICD-10-CM | POA: Diagnosis not present

## 2018-08-15 DIAGNOSIS — I10 Essential (primary) hypertension: Secondary | ICD-10-CM | POA: Diagnosis not present

## 2018-08-15 DIAGNOSIS — N189 Chronic kidney disease, unspecified: Secondary | ICD-10-CM | POA: Diagnosis not present

## 2018-08-18 DIAGNOSIS — E46 Unspecified protein-calorie malnutrition: Secondary | ICD-10-CM | POA: Diagnosis not present

## 2018-08-18 DIAGNOSIS — N189 Chronic kidney disease, unspecified: Secondary | ICD-10-CM | POA: Diagnosis not present

## 2018-08-18 DIAGNOSIS — G309 Alzheimer's disease, unspecified: Secondary | ICD-10-CM | POA: Diagnosis not present

## 2018-08-18 DIAGNOSIS — I2510 Atherosclerotic heart disease of native coronary artery without angina pectoris: Secondary | ICD-10-CM | POA: Diagnosis not present

## 2018-08-18 DIAGNOSIS — I10 Essential (primary) hypertension: Secondary | ICD-10-CM | POA: Diagnosis not present

## 2018-08-18 DIAGNOSIS — K219 Gastro-esophageal reflux disease without esophagitis: Secondary | ICD-10-CM | POA: Diagnosis not present

## 2018-08-21 DIAGNOSIS — I2510 Atherosclerotic heart disease of native coronary artery without angina pectoris: Secondary | ICD-10-CM | POA: Diagnosis not present

## 2018-08-21 DIAGNOSIS — N189 Chronic kidney disease, unspecified: Secondary | ICD-10-CM | POA: Diagnosis not present

## 2018-08-21 DIAGNOSIS — G309 Alzheimer's disease, unspecified: Secondary | ICD-10-CM | POA: Diagnosis not present

## 2018-08-21 DIAGNOSIS — K219 Gastro-esophageal reflux disease without esophagitis: Secondary | ICD-10-CM | POA: Diagnosis not present

## 2018-08-21 DIAGNOSIS — I10 Essential (primary) hypertension: Secondary | ICD-10-CM | POA: Diagnosis not present

## 2018-08-21 DIAGNOSIS — E46 Unspecified protein-calorie malnutrition: Secondary | ICD-10-CM | POA: Diagnosis not present

## 2018-08-23 DIAGNOSIS — K810 Acute cholecystitis: Secondary | ICD-10-CM | POA: Diagnosis not present

## 2018-08-23 DIAGNOSIS — E039 Hypothyroidism, unspecified: Secondary | ICD-10-CM | POA: Diagnosis not present

## 2018-08-23 DIAGNOSIS — N189 Chronic kidney disease, unspecified: Secondary | ICD-10-CM | POA: Diagnosis not present

## 2018-08-23 DIAGNOSIS — I10 Essential (primary) hypertension: Secondary | ICD-10-CM | POA: Diagnosis not present

## 2018-08-23 DIAGNOSIS — E785 Hyperlipidemia, unspecified: Secondary | ICD-10-CM | POA: Diagnosis not present

## 2018-08-23 DIAGNOSIS — I2510 Atherosclerotic heart disease of native coronary artery without angina pectoris: Secondary | ICD-10-CM | POA: Diagnosis not present

## 2018-08-23 DIAGNOSIS — G309 Alzheimer's disease, unspecified: Secondary | ICD-10-CM | POA: Diagnosis not present

## 2018-08-23 DIAGNOSIS — E46 Unspecified protein-calorie malnutrition: Secondary | ICD-10-CM | POA: Diagnosis not present

## 2018-08-23 DIAGNOSIS — K219 Gastro-esophageal reflux disease without esophagitis: Secondary | ICD-10-CM | POA: Diagnosis not present

## 2018-08-24 DIAGNOSIS — E46 Unspecified protein-calorie malnutrition: Secondary | ICD-10-CM | POA: Diagnosis not present

## 2018-08-24 DIAGNOSIS — I10 Essential (primary) hypertension: Secondary | ICD-10-CM | POA: Diagnosis not present

## 2018-08-24 DIAGNOSIS — I2510 Atherosclerotic heart disease of native coronary artery without angina pectoris: Secondary | ICD-10-CM | POA: Diagnosis not present

## 2018-08-24 DIAGNOSIS — N189 Chronic kidney disease, unspecified: Secondary | ICD-10-CM | POA: Diagnosis not present

## 2018-08-24 DIAGNOSIS — G309 Alzheimer's disease, unspecified: Secondary | ICD-10-CM | POA: Diagnosis not present

## 2018-08-24 DIAGNOSIS — K219 Gastro-esophageal reflux disease without esophagitis: Secondary | ICD-10-CM | POA: Diagnosis not present

## 2018-08-25 ENCOUNTER — Other Ambulatory Visit (HOSPITAL_COMMUNITY): Payer: Self-pay | Admitting: Interventional Radiology

## 2018-08-25 ENCOUNTER — Telehealth (HOSPITAL_COMMUNITY): Payer: Self-pay | Admitting: Radiology

## 2018-08-25 DIAGNOSIS — K810 Acute cholecystitis: Secondary | ICD-10-CM

## 2018-08-25 NOTE — Telephone Encounter (Signed)
Attempted to call pt and spouse, home number disconnected. Called pt's daughter, [name], left VM for her to call to set up a drain check for her father. JM

## 2018-08-28 DIAGNOSIS — I10 Essential (primary) hypertension: Secondary | ICD-10-CM | POA: Diagnosis not present

## 2018-08-28 DIAGNOSIS — K219 Gastro-esophageal reflux disease without esophagitis: Secondary | ICD-10-CM | POA: Diagnosis not present

## 2018-08-28 DIAGNOSIS — G309 Alzheimer's disease, unspecified: Secondary | ICD-10-CM | POA: Diagnosis not present

## 2018-08-28 DIAGNOSIS — E46 Unspecified protein-calorie malnutrition: Secondary | ICD-10-CM | POA: Diagnosis not present

## 2018-08-28 DIAGNOSIS — N189 Chronic kidney disease, unspecified: Secondary | ICD-10-CM | POA: Diagnosis not present

## 2018-08-28 DIAGNOSIS — I2510 Atherosclerotic heart disease of native coronary artery without angina pectoris: Secondary | ICD-10-CM | POA: Diagnosis not present

## 2018-08-29 DIAGNOSIS — K219 Gastro-esophageal reflux disease without esophagitis: Secondary | ICD-10-CM | POA: Diagnosis not present

## 2018-08-29 DIAGNOSIS — I10 Essential (primary) hypertension: Secondary | ICD-10-CM | POA: Diagnosis not present

## 2018-08-29 DIAGNOSIS — I2510 Atherosclerotic heart disease of native coronary artery without angina pectoris: Secondary | ICD-10-CM | POA: Diagnosis not present

## 2018-08-29 DIAGNOSIS — N189 Chronic kidney disease, unspecified: Secondary | ICD-10-CM | POA: Diagnosis not present

## 2018-08-29 DIAGNOSIS — E46 Unspecified protein-calorie malnutrition: Secondary | ICD-10-CM | POA: Diagnosis not present

## 2018-08-29 DIAGNOSIS — G309 Alzheimer's disease, unspecified: Secondary | ICD-10-CM | POA: Diagnosis not present

## 2018-08-30 DIAGNOSIS — K219 Gastro-esophageal reflux disease without esophagitis: Secondary | ICD-10-CM | POA: Diagnosis not present

## 2018-08-30 DIAGNOSIS — I2510 Atherosclerotic heart disease of native coronary artery without angina pectoris: Secondary | ICD-10-CM | POA: Diagnosis not present

## 2018-08-30 DIAGNOSIS — I10 Essential (primary) hypertension: Secondary | ICD-10-CM | POA: Diagnosis not present

## 2018-08-30 DIAGNOSIS — E46 Unspecified protein-calorie malnutrition: Secondary | ICD-10-CM | POA: Diagnosis not present

## 2018-08-30 DIAGNOSIS — N189 Chronic kidney disease, unspecified: Secondary | ICD-10-CM | POA: Diagnosis not present

## 2018-08-30 DIAGNOSIS — G309 Alzheimer's disease, unspecified: Secondary | ICD-10-CM | POA: Diagnosis not present

## 2018-08-31 DIAGNOSIS — G309 Alzheimer's disease, unspecified: Secondary | ICD-10-CM | POA: Diagnosis not present

## 2018-08-31 DIAGNOSIS — N189 Chronic kidney disease, unspecified: Secondary | ICD-10-CM | POA: Diagnosis not present

## 2018-08-31 DIAGNOSIS — E46 Unspecified protein-calorie malnutrition: Secondary | ICD-10-CM | POA: Diagnosis not present

## 2018-08-31 DIAGNOSIS — K219 Gastro-esophageal reflux disease without esophagitis: Secondary | ICD-10-CM | POA: Diagnosis not present

## 2018-08-31 DIAGNOSIS — I10 Essential (primary) hypertension: Secondary | ICD-10-CM | POA: Diagnosis not present

## 2018-08-31 DIAGNOSIS — I2510 Atherosclerotic heart disease of native coronary artery without angina pectoris: Secondary | ICD-10-CM | POA: Diagnosis not present

## 2018-09-23 DEATH — deceased

## 2019-09-07 IMAGING — CR DG HIP (WITH OR WITHOUT PELVIS) 2-3V*L*
3 series · 3 of 3 positions shown · non-contrast
Comparison: None.

CLINICAL DATA: Fall today with left hip pain.  Initial encounter.

EXAM:
DG HIP (WITH OR WITHOUT PELVIS) 2-3V LEFT

[x pelvis]
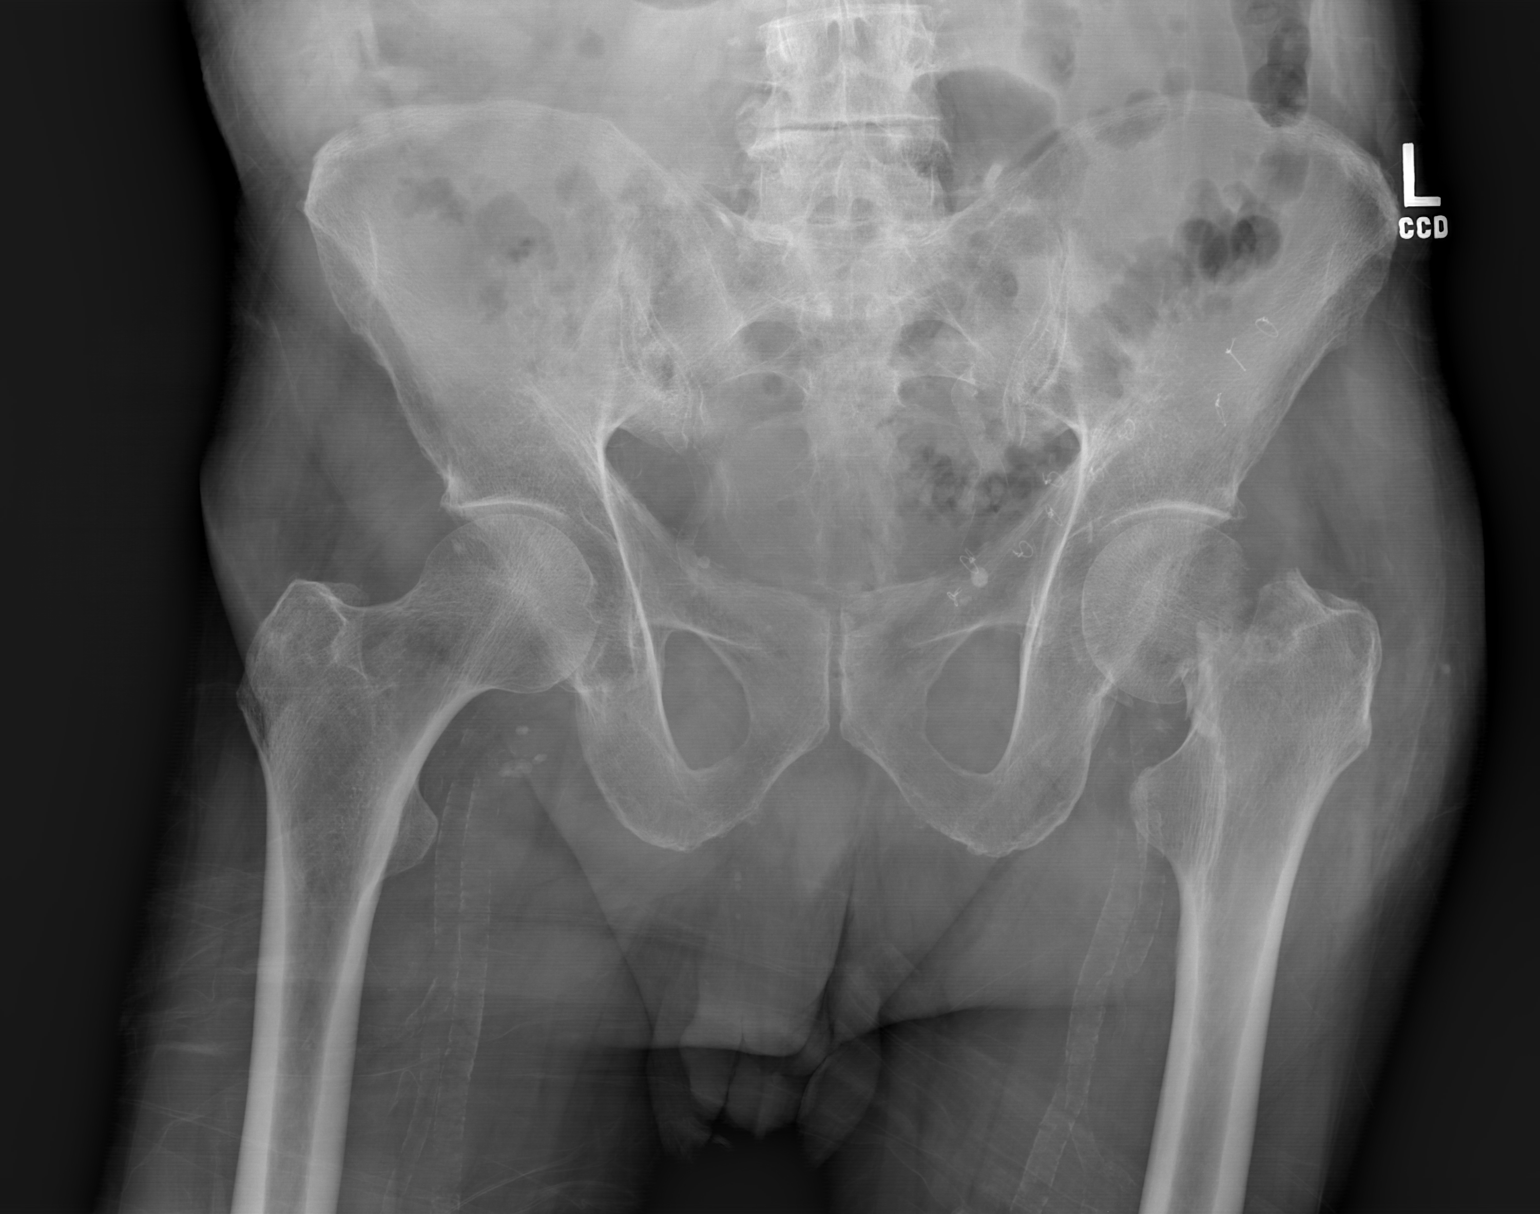

[x hip ap left]
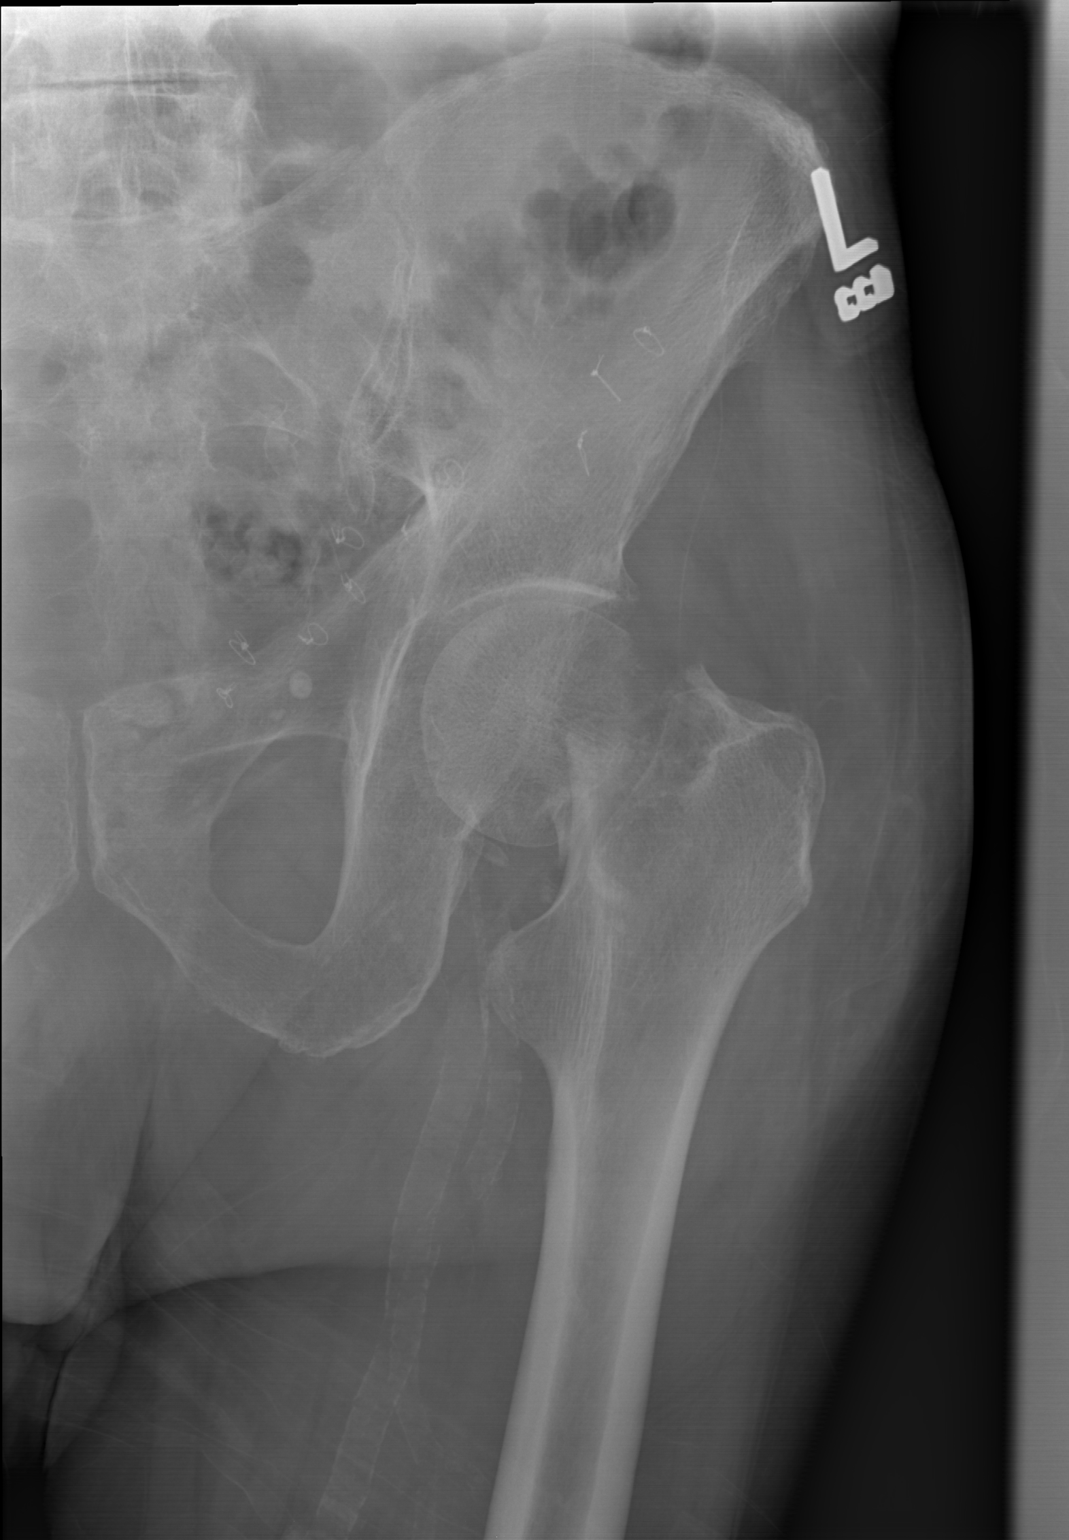

[w hip lat left]
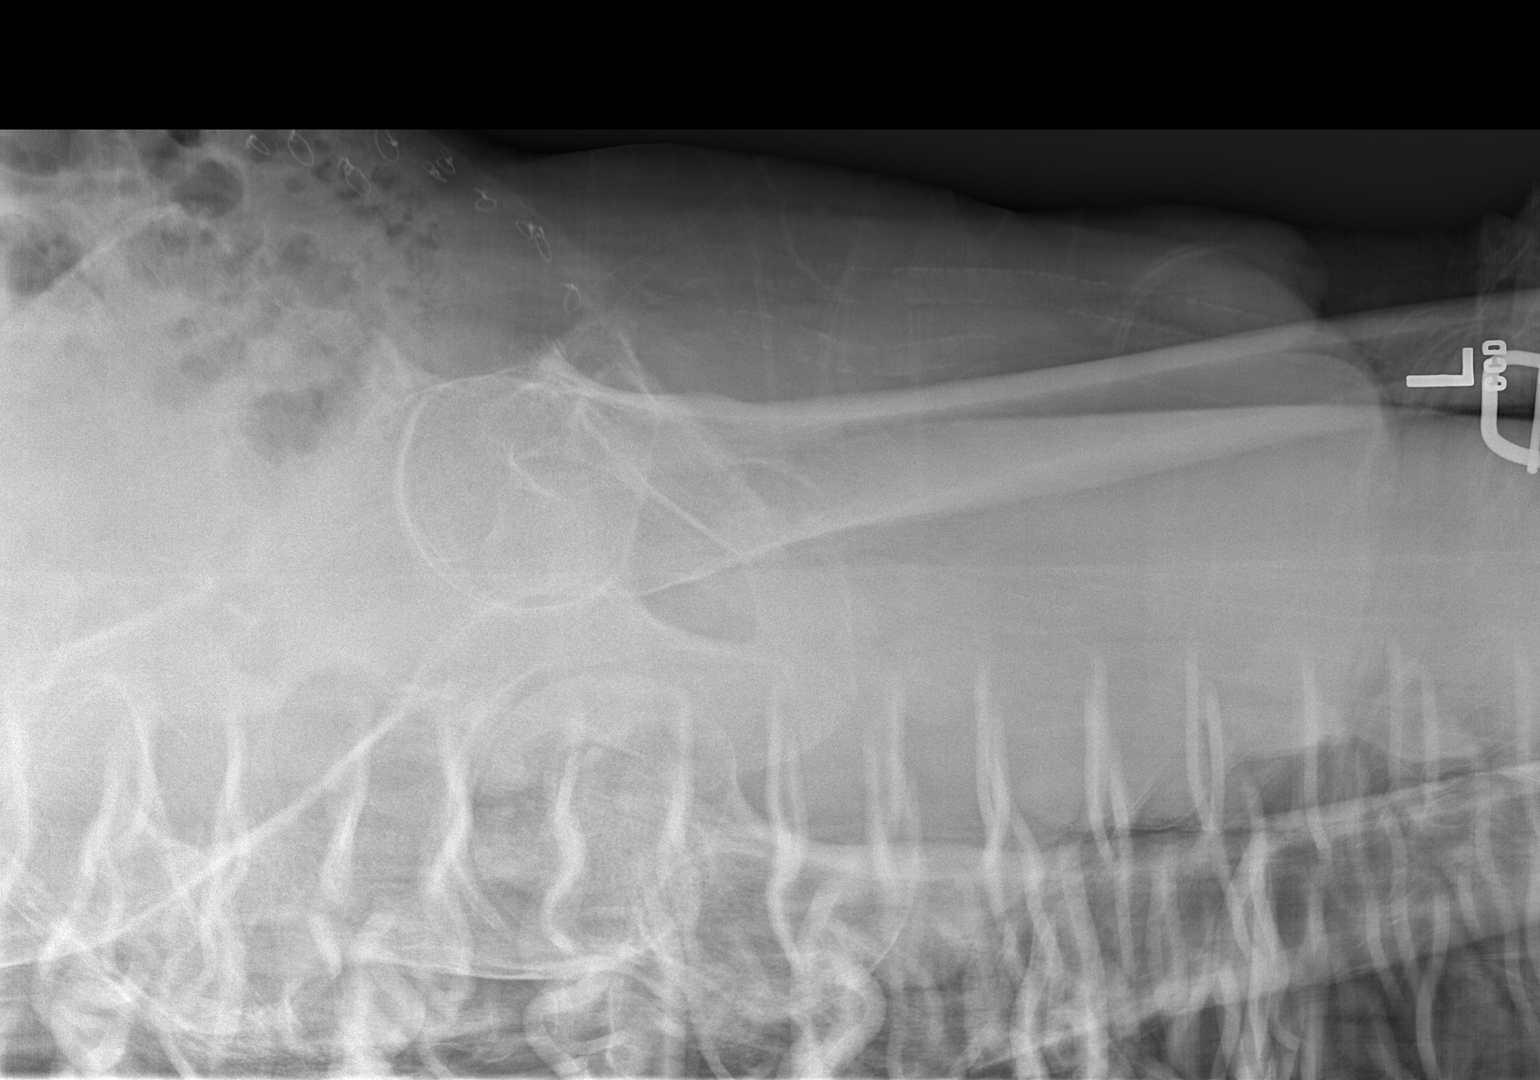

[3 of 3 positions shown; findings below may reference images not displayed]

FINDINGS: Left femoral neck fracture with displacement and varus angulation.
No notable hip degeneration. Osteopenia and atherosclerosis. Remote
left groin hernia repair.
IMPRESSION: Displaced left femoral neck fracture.

## 2019-09-07 IMAGING — CT CT HEAD W/O CM
3 series · 15 of 47 positions shown, 18 images · non-contrast
Comparison: Brain MRI dated 03/06/2013.

CLINICAL DATA: Status post fall.  Head trauma.  On anticoagulation.

EXAM:
CT HEAD WITHOUT CONTRAST
TECHNIQUE: Contiguous axial images were obtained from the base of the skull
through the vertex without intravenous contrast.

[Series 2: head wo · axial · 0.48mm/px · z∈[-155,-20]mm · 9 of 33 slices shown, 12 images]
[im 3/33  brain]
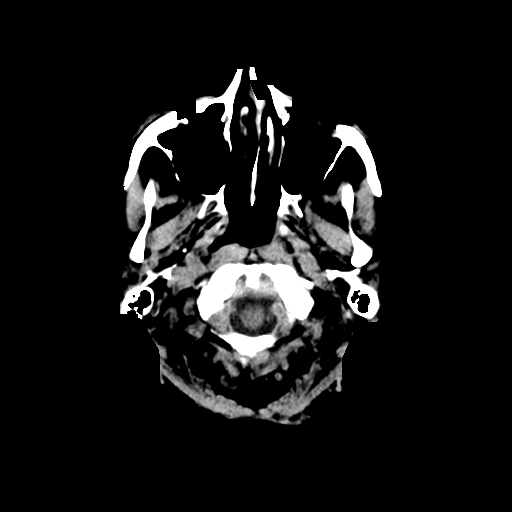
[im 3/33  bone]
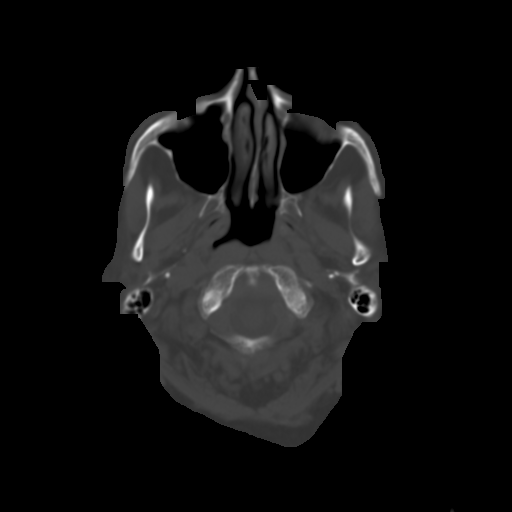
[im 6/33  brain]
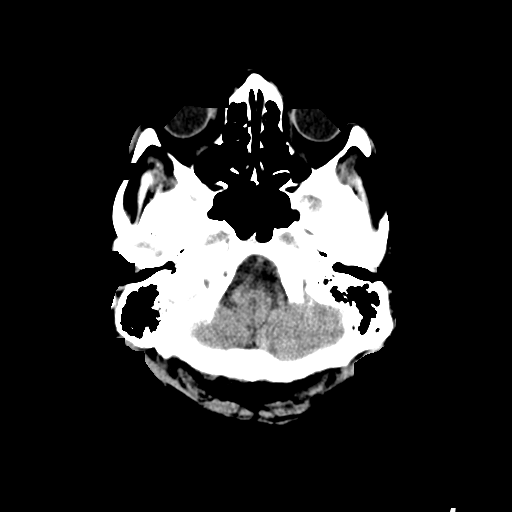
[im 9/33  brain]
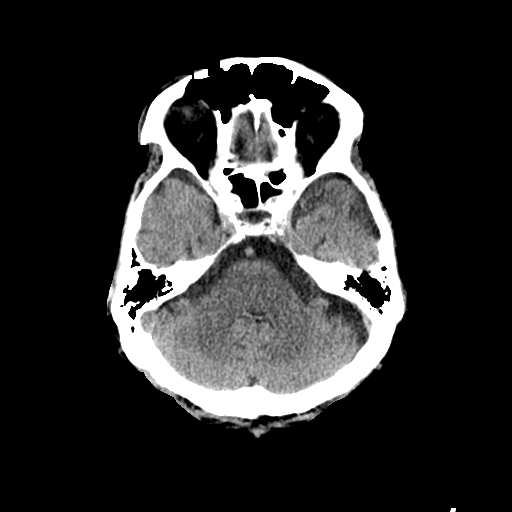
[im 13/33  brain]
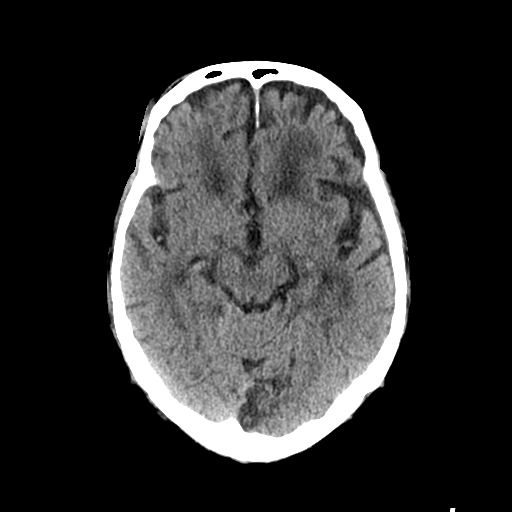
[im 17/33  brain]
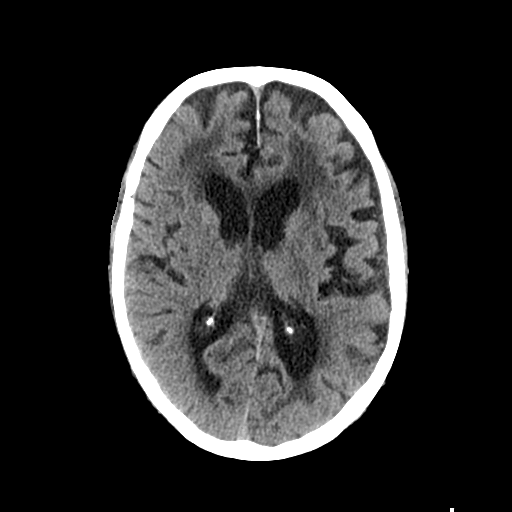
[im 17/33  bone]
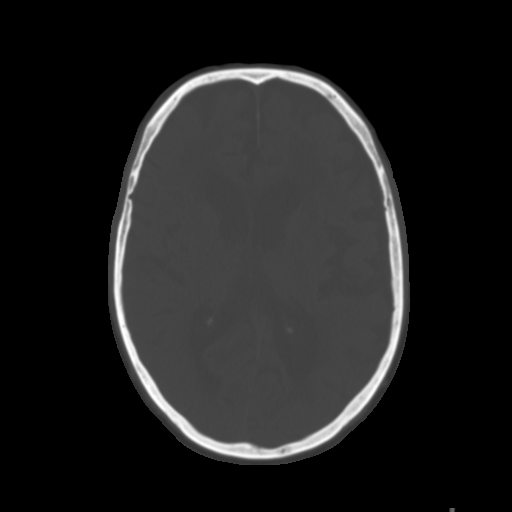
[im 20/33  brain]
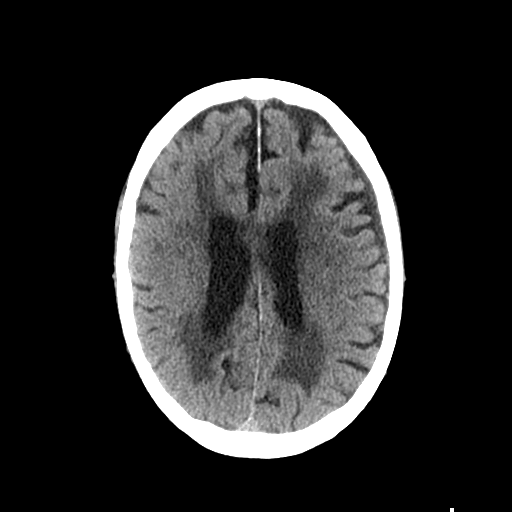
[im 24/33  brain]
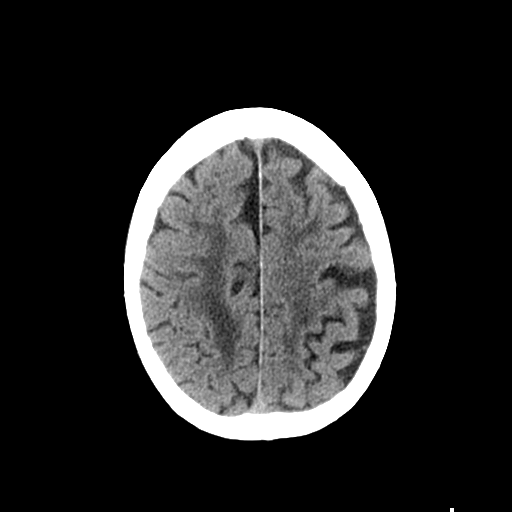
[im 27/33  brain]
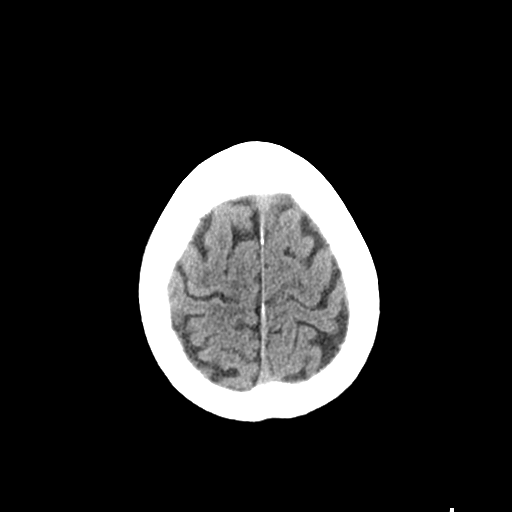
[im 30/33  brain]
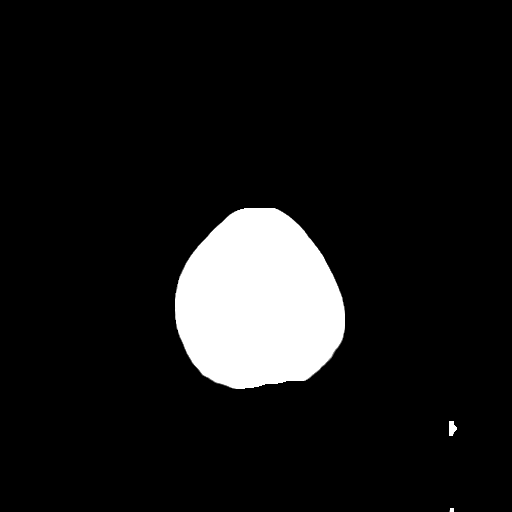
[im 30/33  bone]
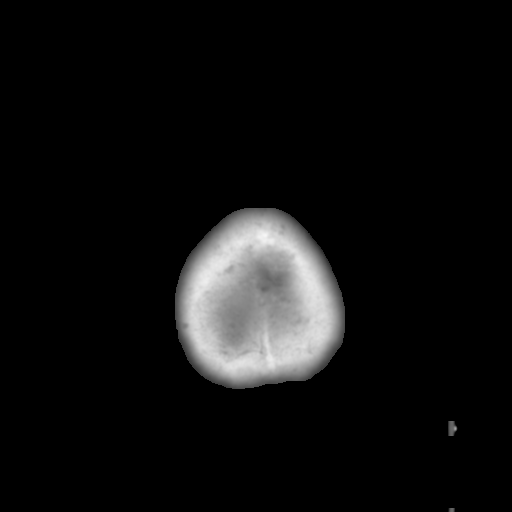

[Series 5: coronal soft tissue · coronal · 0.32mm/px · 3 of 84 slices shown]
[im 28/84  brain]
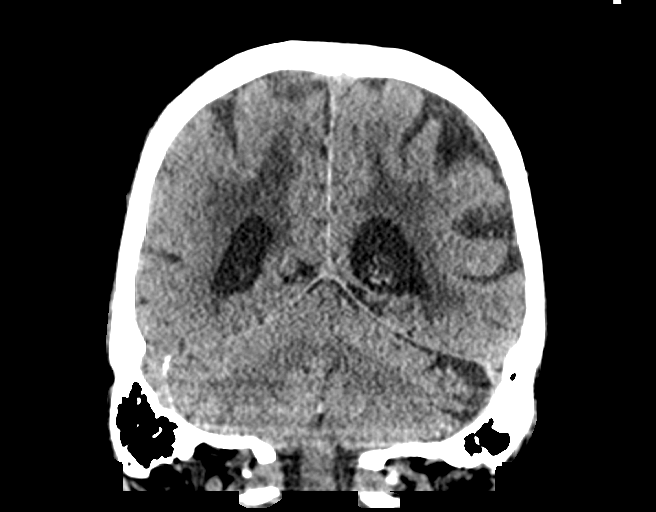
[im 37/84  brain]
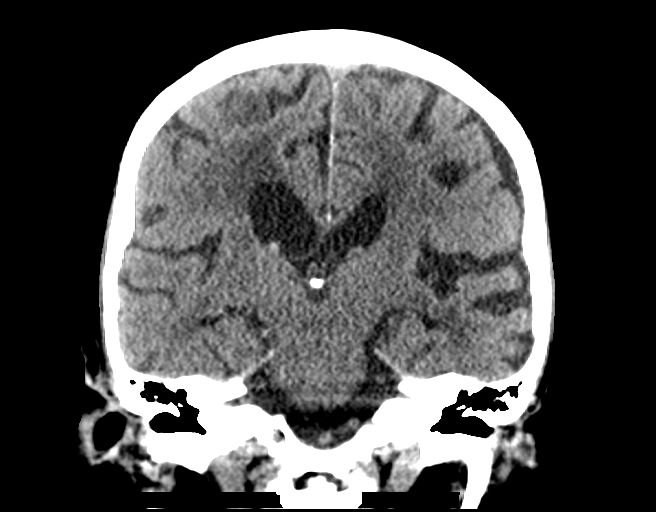
[im 47/84  brain]
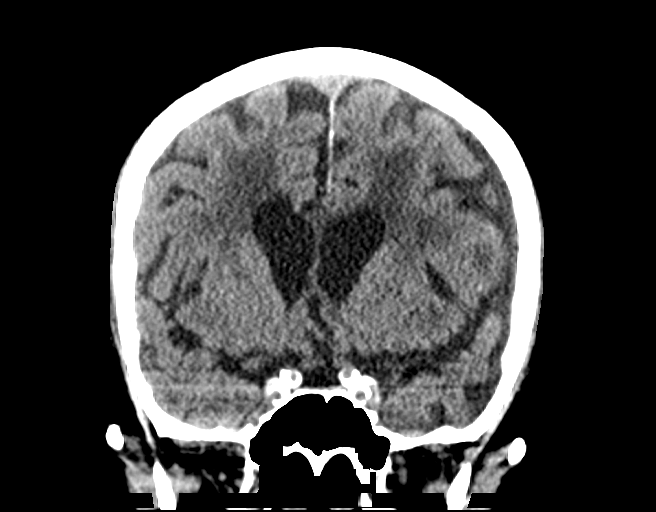

[Series 6: sagittal soft tissue · sagittal · 0.32mm/px · 3 of 67 slices shown]
[im 23/67  brain]
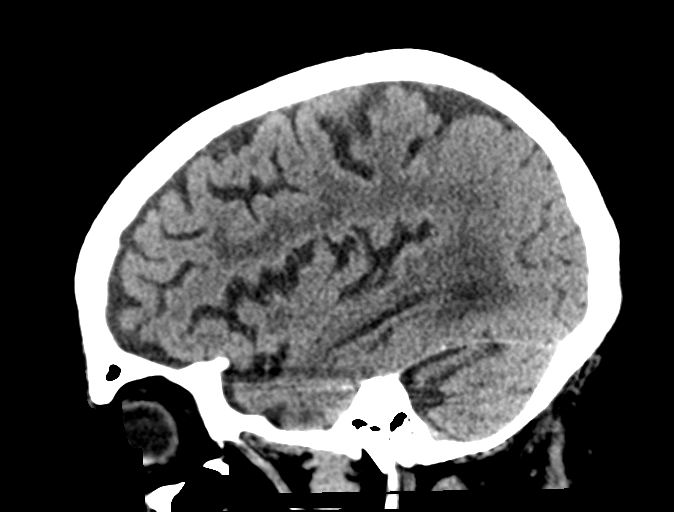
[im 34/67  brain]
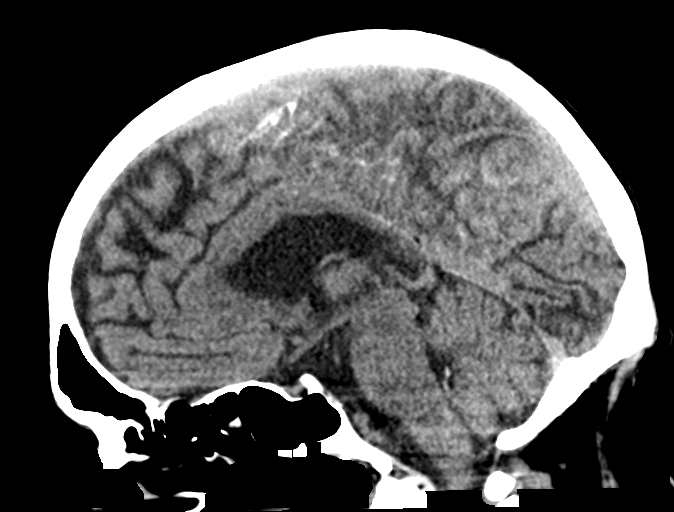
[im 45/67  brain]
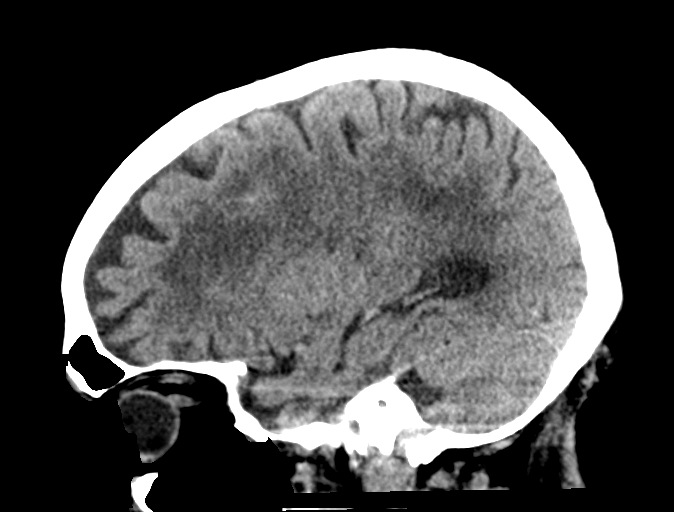

[15 of 47 positions shown; findings below may reference images not displayed]

FINDINGS: Brain: Confluent areas of low attenuation are again seen throughout
the bilateral periventricular and subcortical white matter regions,
consistent with chronic small vessel ischemic changes, not
significantly changed in extent compared to earlier brain MRI.

There is no mass, hemorrhage, edema or other evidence of acute
parenchymal abnormality. No extra-axial hemorrhage.

Vascular: There are chronic calcified atherosclerotic changes of the
large vessels at the skull base. No unexpected hyperdense vessel.

Skull: Normal. Negative for fracture or focal lesion.

Sinuses/Orbits: No acute finding.

Other: None.
IMPRESSION: 1. No acute findings. No intracranial hemorrhage or edema. No skull
fracture.
2. Chronic small vessel ischemic changes within the white matter.

## 2019-09-09 IMAGING — DX DG PORTABLE PELVIS
1 series · 1 of 1 positions shown · non-contrast
Comparison: Left hip x-rays dated January 24, 2018.

CLINICAL DATA: Left total hip replacement.

EXAM:
PORTABLE PELVIS 1-2 VIEWS

[pelvis ap]
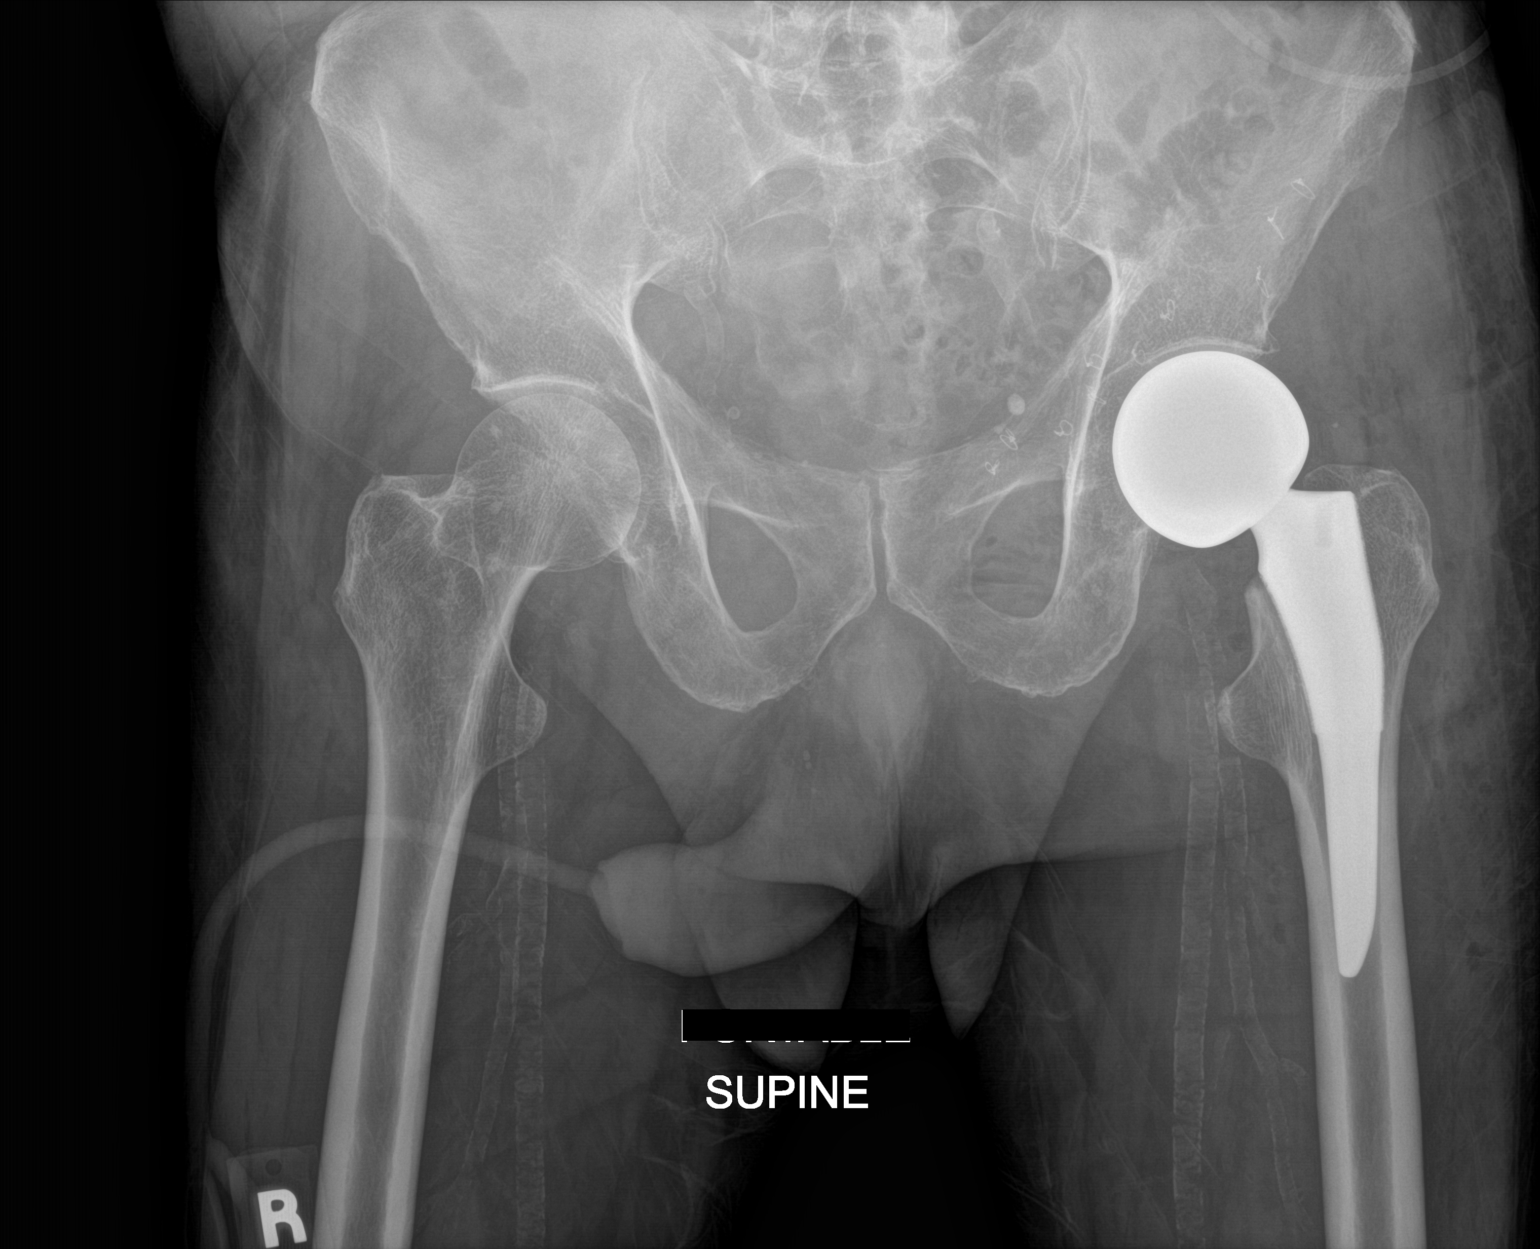

[1 of 1 positions shown; findings below may reference images not displayed]

FINDINGS: The left hip demonstrates a hemiarthroplasty without evidence of
hardware failure or complication. There is expected intra-articular
air. There is no fracture or dislocation. The alignment is anatomic.
Post-surgical changes noted in the surrounding soft tissues. The
right hip is unremarkable. Osteopenia. Vascular calcifications.
IMPRESSION: 1. Interval left hip hemiarthroplasty without evidence of acute
postoperative complication.

## 2020-03-21 IMAGING — DX DG CHEST 1V PORT
1 series · 1 of 1 positions shown · non-contrast
Comparison: Chest x-ray of 02/17/2016

CLINICAL DATA: Decreased oral intake, some vomiting

EXAM:
PORTABLE CHEST 1 VIEW

[chest]
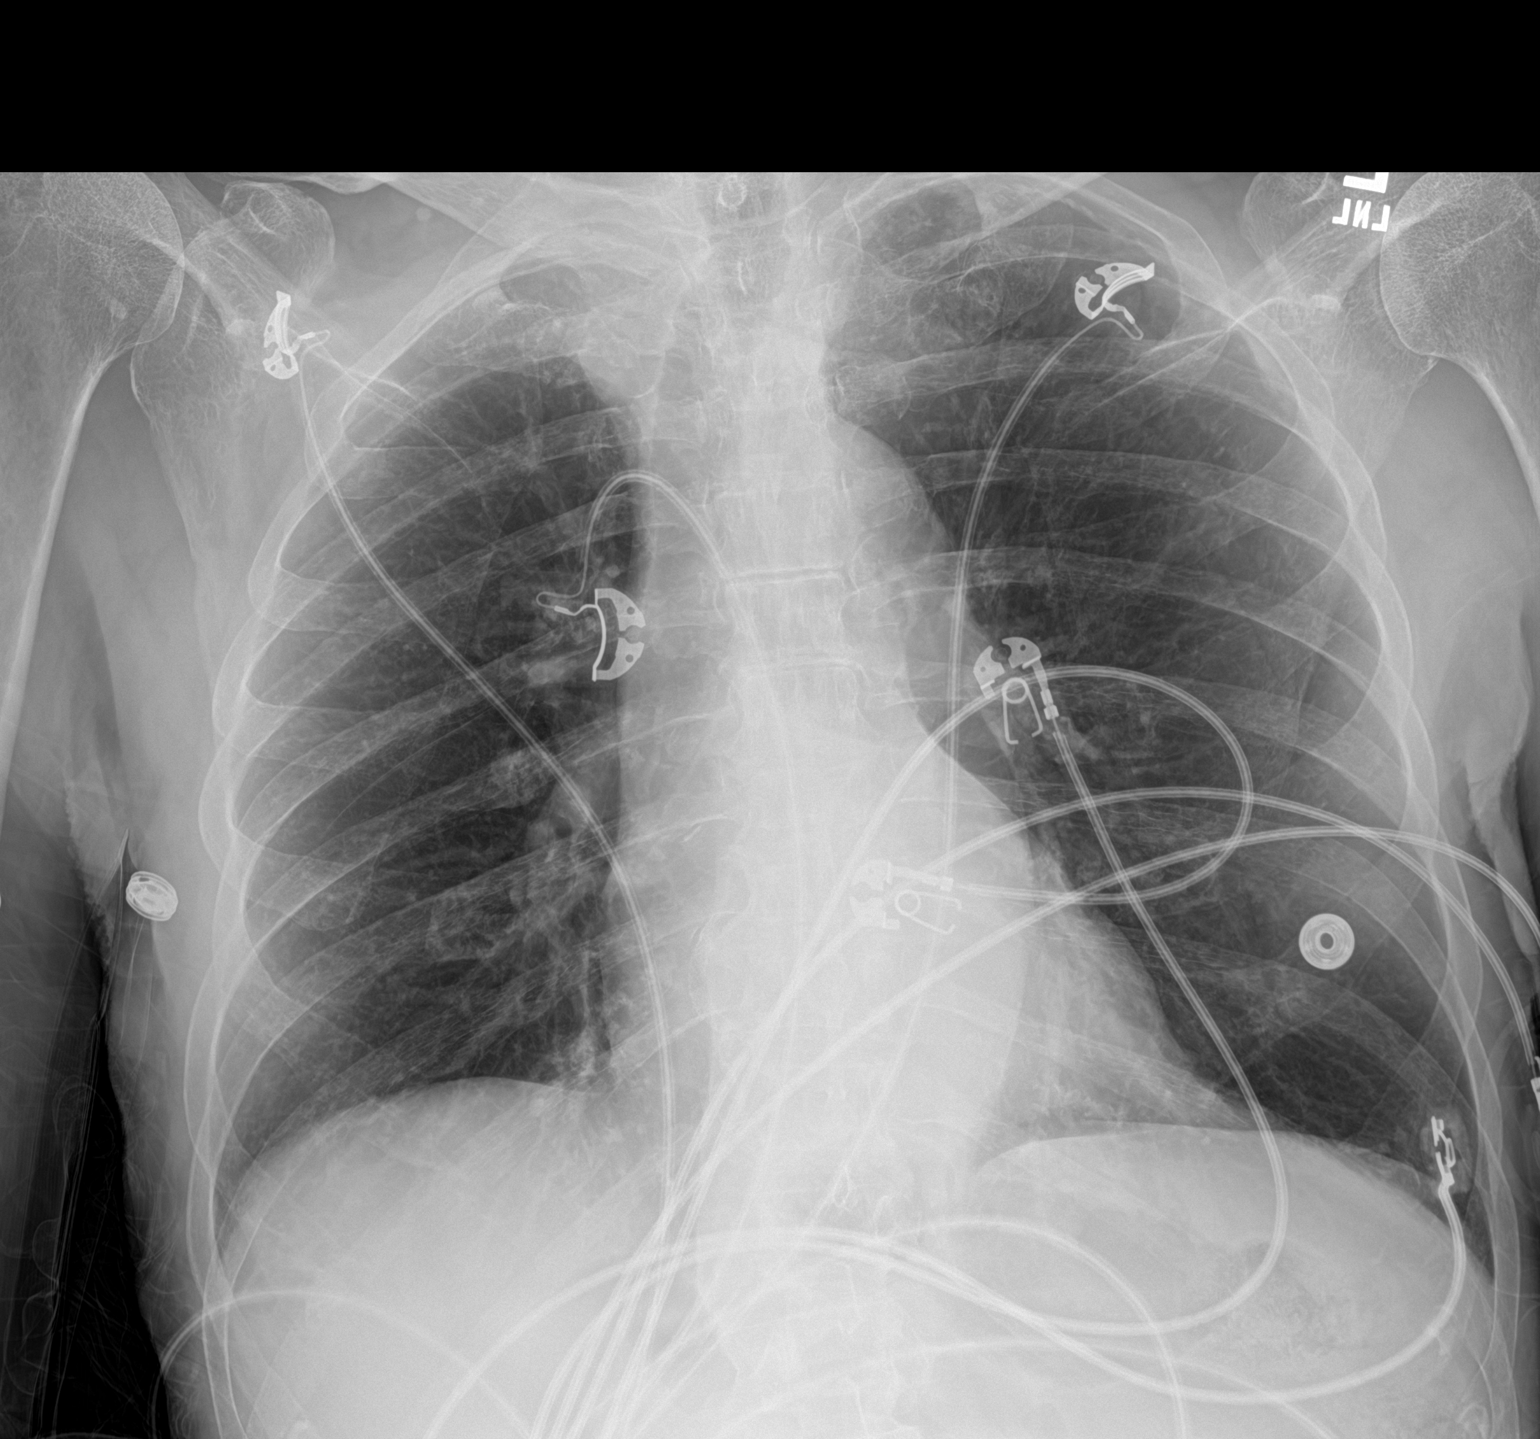

[1 of 1 positions shown; findings below may reference images not displayed]

FINDINGS: No active infiltrate or effusion is seen. The lungs may be slightly
hyperaerated. Mediastinal and hilar contours are unremarkable and
the heart is mildly enlarged. No acute bony abnormality is seen.
IMPRESSION: No active cardiopulmonary disease.  Borderline cardiomegaly.
